# Patient Record
Sex: Male | Born: 1945 | Race: White | Hispanic: No | Smoking: Current every day smoker
Health system: Southern US, Community
[De-identification: ages and names within clinical notes are randomized; demographics above are authoritative.]

## PROBLEM LIST (undated history)

## (undated) DIAGNOSIS — I10 Essential (primary) hypertension: Secondary | ICD-10-CM

## (undated) DIAGNOSIS — I2510 Atherosclerotic heart disease of native coronary artery without angina pectoris: Secondary | ICD-10-CM

## (undated) DIAGNOSIS — K219 Gastro-esophageal reflux disease without esophagitis: Secondary | ICD-10-CM

## (undated) DIAGNOSIS — G4730 Sleep apnea, unspecified: Secondary | ICD-10-CM

## (undated) DIAGNOSIS — I739 Peripheral vascular disease, unspecified: Secondary | ICD-10-CM

## (undated) DIAGNOSIS — M1990 Unspecified osteoarthritis, unspecified site: Secondary | ICD-10-CM

## (undated) DIAGNOSIS — I059 Rheumatic mitral valve disease, unspecified: Secondary | ICD-10-CM

## (undated) DIAGNOSIS — E785 Hyperlipidemia, unspecified: Secondary | ICD-10-CM

## (undated) HISTORY — PX: FRACTURE SURGERY: SHX138

## (undated) HISTORY — PX: EYE SURGERY: SHX253

## (undated) HISTORY — PX: OTHER SURGICAL HISTORY: SHX169

## (undated) HISTORY — PX: CORONARY ANGIOPLASTY: SHX604

## (undated) HISTORY — PX: CAROTID ENDARTERECTOMY: SUR193

## (undated) HISTORY — PX: JOINT REPLACEMENT: SHX530

## (undated) MED FILL — Ferumoxytol Inj 510 MG/17ML (30 MG/ML) (Elemental Fe): INTRAVENOUS | Qty: 17 | Status: AC

---

## 2005-04-09 ENCOUNTER — Other Ambulatory Visit: Payer: Self-pay

## 2005-04-16 ENCOUNTER — Ambulatory Visit: Payer: Self-pay | Admitting: Podiatry

## 2005-11-24 ENCOUNTER — Emergency Department: Payer: Self-pay | Admitting: Emergency Medicine

## 2005-11-25 ENCOUNTER — Other Ambulatory Visit: Payer: Self-pay

## 2005-12-16 ENCOUNTER — Ambulatory Visit: Payer: Self-pay | Admitting: Internal Medicine

## 2006-03-24 ENCOUNTER — Ambulatory Visit: Payer: Self-pay | Admitting: Internal Medicine

## 2006-12-21 ENCOUNTER — Ambulatory Visit: Payer: Self-pay | Admitting: Unknown Physician Specialty

## 2006-12-21 ENCOUNTER — Other Ambulatory Visit: Payer: Self-pay

## 2006-12-26 ENCOUNTER — Ambulatory Visit: Payer: Self-pay | Admitting: Unknown Physician Specialty

## 2007-07-07 ENCOUNTER — Other Ambulatory Visit: Payer: Self-pay

## 2007-07-07 ENCOUNTER — Ambulatory Visit: Payer: Self-pay | Admitting: Internal Medicine

## 2007-12-21 ENCOUNTER — Ambulatory Visit: Payer: Self-pay | Admitting: Vascular Surgery

## 2008-01-12 ENCOUNTER — Ambulatory Visit: Payer: Self-pay | Admitting: Vascular Surgery

## 2008-01-22 ENCOUNTER — Ambulatory Visit: Payer: Self-pay | Admitting: Vascular Surgery

## 2008-01-22 ENCOUNTER — Other Ambulatory Visit: Payer: Self-pay

## 2008-01-26 ENCOUNTER — Inpatient Hospital Stay: Payer: Self-pay | Admitting: Vascular Surgery

## 2008-10-04 ENCOUNTER — Ambulatory Visit: Payer: Self-pay | Admitting: Internal Medicine

## 2008-11-22 ENCOUNTER — Ambulatory Visit: Payer: Self-pay | Admitting: Unknown Physician Specialty

## 2009-08-01 ENCOUNTER — Emergency Department: Payer: Self-pay | Admitting: Emergency Medicine

## 2009-10-05 IMAGING — CT CT ANGIOGRAPHY NECK
1 of 4 series · 12 of 33 positions shown · IV contrast (APPLIED)
Comparison: none

REASON FOR EXAM: Carotid Stenosis Call Report [id number]
COMMENTS:

[Series 5: soft tissue · axial · 0.38mm/px · z∈[-195,+63]mm · 12 of 102 slices shown]
[im 8/102  soft-tissue]
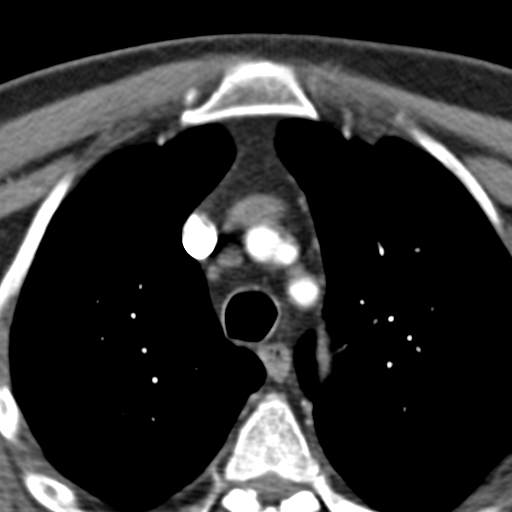
[im 16/102  bone]
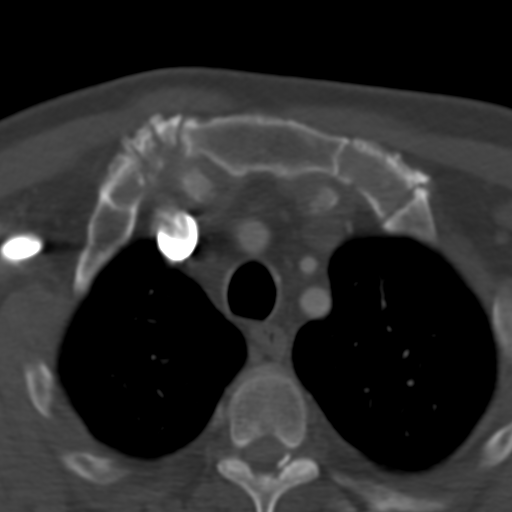
[im 24/102  soft-tissue]
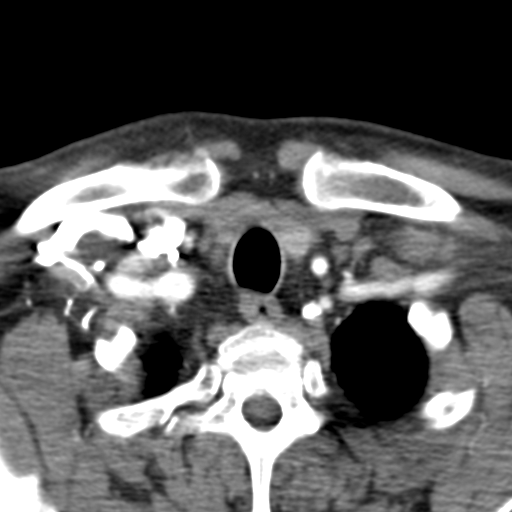
[im 32/102  bone]
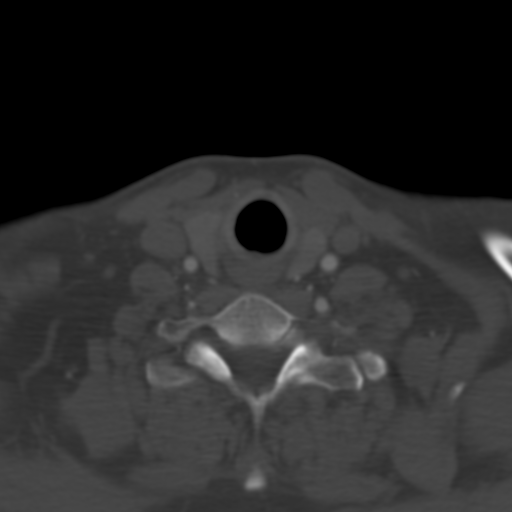
[im 39/102  soft-tissue]
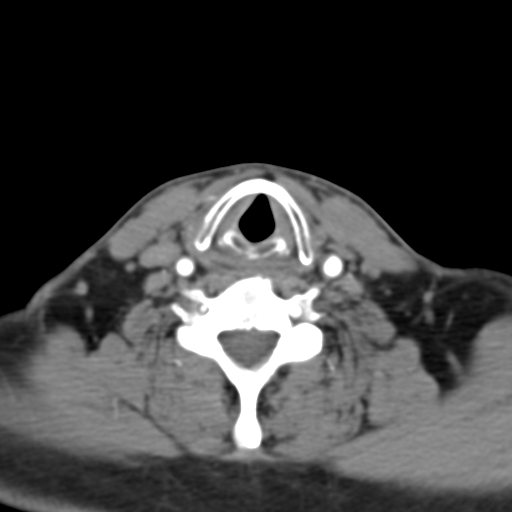
[im 47/102  bone]
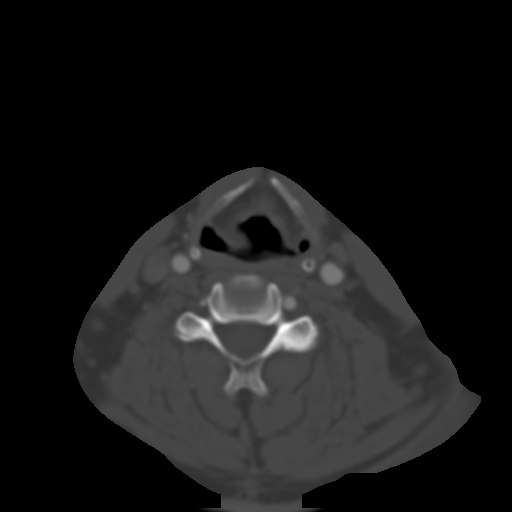
[im 55/102  soft-tissue]
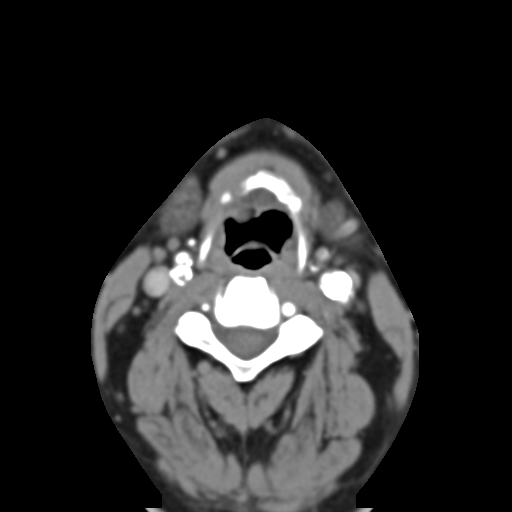
[im 63/102  bone]
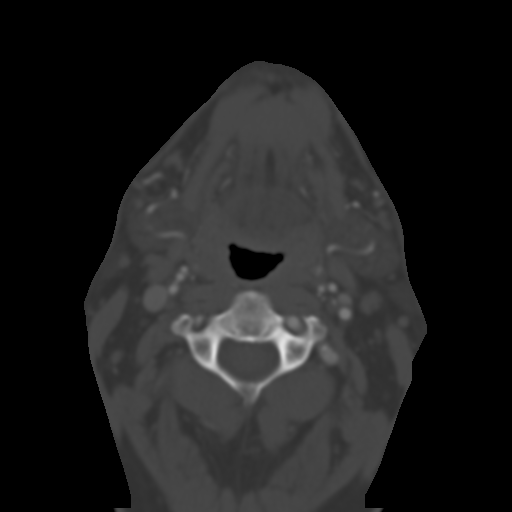
[im 70/102  soft-tissue]
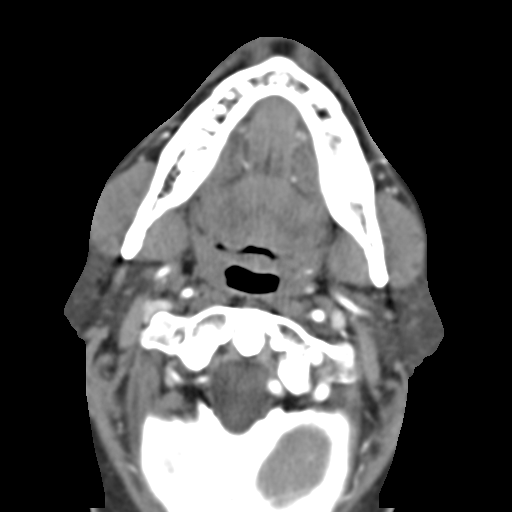
[im 78/102  bone]
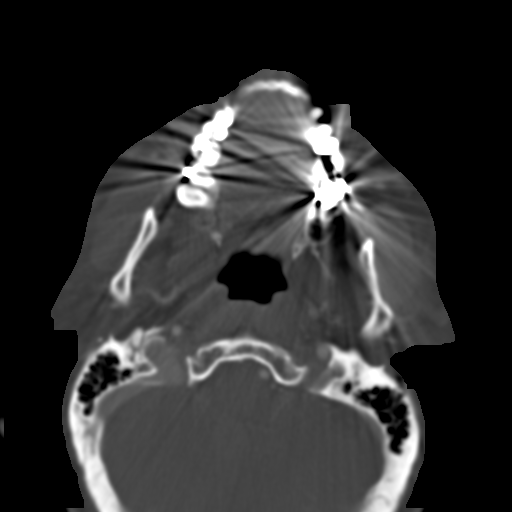
[im 86/102  soft-tissue]
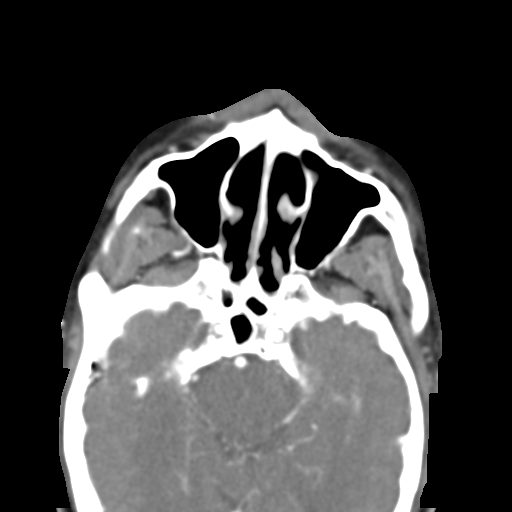
[im 94/102  bone]
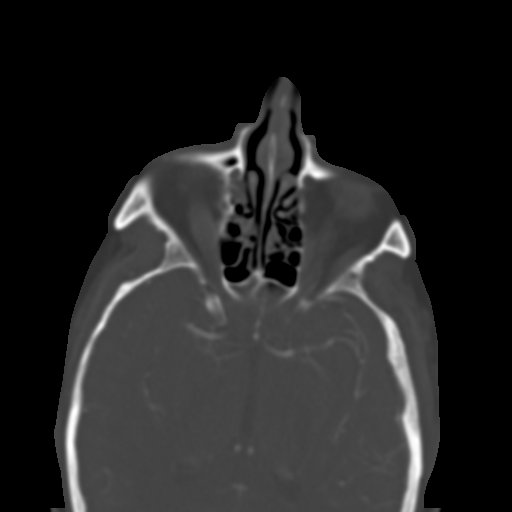

[12 of 33 positions shown; findings below may reference images not displayed]

PROCEDURE:     CT  - CT ANGIOGRAPHY NECK W/WO  - December 21, 2007  [DATE]

RESULT:     CTA of the carotids is performed. There is no previous similar
exam for comparison. The study demonstrates atherosclerotic calcification
prominently demonstrated in both carotid bulbs and proximal internal carotid
arteries. There appears to be a high degree of stenosis on the right side
without definite occlusion. This appears to extend over a length of the
proximal right internal carotid artery of 1.3 to 1.5 cm. There is a high
degree of atherosclerotic plaque in the left carotid bulb and proximal
internal carotid artery without significant stenosis evident. The left
vertebral is dominant. The basilar artery is unremarkable. No intracranial
abnormality is evident. Atherosclerotic calcification is also evident in the
aortic arch. There is poor visualization of the right common carotid artery
because of artifact from the bolus in the right upper extremity. There is a
mucous retention cyst in the floor of the left maxillary sinus.
IMPRESSION: 1.5 cm length high percentage stenosis in the proximal right internal
carotid artery with atherosclerotic calcification present.
2. Atherosclerotic calcification in the left internal carotid artery
proximally without hemodynamically significant stenosis evident.

## 2011-03-17 ENCOUNTER — Ambulatory Visit: Payer: Self-pay | Admitting: Vascular Surgery

## 2011-04-14 ENCOUNTER — Ambulatory Visit: Payer: Self-pay | Admitting: Vascular Surgery

## 2011-04-21 ENCOUNTER — Inpatient Hospital Stay: Payer: Self-pay | Admitting: Vascular Surgery

## 2011-04-23 ENCOUNTER — Emergency Department: Payer: Self-pay | Admitting: Emergency Medicine

## 2014-03-07 DIAGNOSIS — G4730 Sleep apnea, unspecified: Secondary | ICD-10-CM | POA: Insufficient documentation

## 2014-03-07 DIAGNOSIS — I10 Essential (primary) hypertension: Secondary | ICD-10-CM | POA: Insufficient documentation

## 2014-05-08 ENCOUNTER — Ambulatory Visit: Payer: Self-pay | Admitting: Ophthalmology

## 2014-05-08 LAB — POTASSIUM: POTASSIUM: 3.8 mmol/L (ref 3.5–5.1)

## 2014-05-16 ENCOUNTER — Ambulatory Visit: Payer: Self-pay | Admitting: Ophthalmology

## 2014-06-28 ENCOUNTER — Ambulatory Visit: Payer: Self-pay | Admitting: Ophthalmology

## 2014-06-28 LAB — POTASSIUM: Potassium: 4.1 mmol/L (ref 3.5–5.1)

## 2014-07-04 ENCOUNTER — Ambulatory Visit: Payer: Self-pay | Admitting: Ophthalmology

## 2014-12-07 NOTE — Op Note (Signed)
PATIENT NAME:  Ivan Reyes, Ivan Reyes MR#:  [id number] DATE OF BIRTH:  1945/09/30  DATE OF PROCEDURE:  07/04/2014  PREOPERATIVE DIAGNOSIS:  Nuclear sclerotic cataract, left eye.  POSTOPERATIVE DIAGNOSIS:  Nuclear sclerotic cataract, left eye.  PROCEDURE:  Phacoemulsification with posterior chamber intraocular lens left eye, model SN60WF  SURGEON:  Lyla Glassing, MD  INDICATIONS:  This is a 69 year old male with decreased vision in the left eye.  PROCEDURE:  The risks and benefits of cataract surgery were discussed at length with the patient, including bleeding, infection, retinal detachment, re-operation, diplopia, ptosis, loss of vision, and loss of the eye. Informed consent was obtained. On the day of surgery, several sets of preoperative medication were administered to the operative eye including 0.5% tetracaine,1% cyclopentolate, 10% phenylephrine, 0.5% ketorolac, 0.5% gatifloxacin, and 2% lidocaine .  The patient was taken to the operating room and sedated via IV sedation. Topical tetracaine was placed in the eye. The operative eye was prepped using a 10% Betadine solution and then covered in sterile drapes leaving only the operative eye exposed. A Lieberman lid speculum was placed to provide exposure. Using 0.12 forceps and a side-port blade, a paracentesis was created. Then a mixture of BSS, preservative free lidocaine, and epinephrine was injected into the anterior chamber. Next, a 2.4 mm keratome blade was used to create a two-step full-thickness clear corneal incision temporally. The cystitome and Utrata forceps were used to create a continuous capsulorrhexis in the anterior lens capsule. BSS on a hydrodissection cannula was used to perform gentle hydrodissection. Phacoemulsification was then performed to remove the nucleus. Irrigation and aspiration was performed to remove the remaining cortical material. Provisc was injected to fill the capsular bag and anterior chamber. A 22.5-diopter  SN60WF intraocular lens was injected into the capsular bag. The Connor wand was used to rotate it into proper position in the capsular bag. Irrigation and aspiration was performed to remove the remaining Viscoelastic material from the eye. BSS on a 30-gauge cannula was used to hydrate the wound. An intracameral antibiotic was administered. The wounds were checked and found to be watertight. The lid speculum and drapes were carefully removed. Several drops of Vigamox were placed in the operative eye. The eye was covered with protective eyewear. The patient was taken to the recovery area in good condition. There were no complications.  ____________________________ Lyla Glassing, MD nm:sb D: 07/04/2014 11:15:17 ET T: 07/04/2014 16:16:56 ET JOB#: [id number]  cc: Lyla Glassing, MD, <Dictator> Lyla Glassing MD ELECTRONICALLY SIGNED 07/25/2014 12:13

## 2014-12-07 NOTE — Op Note (Signed)
PATIENT NAME:  Ivan Reyes, Ivan Reyes MR#:  [id number] DATE OF BIRTH:  02-23-1946  DATE OF PROCEDURE:  05/16/2014  DIAGNOSIS CODE:  H25.9, senile cataract.   PREOPERATIVE DIAGNOSIS: Senile cataract right eye.   POSTOPERATIVE DIAGNOSIS: Senile cataract right eye.   PROCEDURE: Phacoemulsification with posterior chamber intraocular lens model SN60WF   SURGEON: Lyla Glassing, MD  INDICATIONS: This is a 69 year old male with decreased vision in the right eye.  PROCEDURE: The risks and benefits of cataract surgery were discussed at length with the patient, including bleeding, infection, retinal detachment, re-operation, diplopia, ptosis, loss of vision, and loss of the eye. Informed consent was obtained. On the day of surgery, the patient received several sets of preoperative medication in the right eye including 0.5% tetracaine,1% cyclopentolate, 10% phenylephrine, 0.5% ketorolac, 0.5% gatifloxacin, and 2% lidocaine .   The patient was taken to the operating room and sedated via IV sedation. Topical tetracaine was placed in the eye. The operative eye was prepped using a 10% Betadine solution. The right eye was covered in sterile drapes leaving only the operative eye exposed. A Lieberman lid speculum was placed to provide exposure. Using 0.12 forceps and a sideport blade, a paracentesis was created. Then a mixture of BSS, preservative free lidocaine, and epinephrine was injected into the anterior chamber.  Next, a 2.4 mm keratome blade was used to create a two-step full-thickness clear corneal incision temporally. The cystitome and Utrata forceps were used to create a continuous capsulorrhexis in the anterior lens capsule. BSS on a hydrodissection cannula was used to perform gentle hydrodissection. Phacoemulsification was then performed to remove the nucleus. Irrigation and aspiration was performed to remove the remaining cortical material. Provisc was injected to fill the capsular bag and anterior chamber.  A 22.5-diopter SN60WF intraocular lens was injected into the capsular bag. The Connor wand was used to rotate it into proper position in the capsular bag. Irrigation and aspiration was performed to remove the remaining Viscoelastic material from the eye. BSS on a 30-gauge cannula was used to hydrate the wound. An intracameral antibiotic was administered. The wounds were checked and found to be watertight. The lid speculum and drapes were carefully removed. Several drops of Vigamox were placed in the operative eye. The eye was covered with protective eyewear. The patient was taken to the recovery area in good condition. There were no complications.   ____________________________ Lyla Glassing, MD nm:lt D: 05/16/2014 10:01:26 ET T: 05/16/2014 12:58:45 ET JOB#: [id number]  cc: Lyla Glassing, MD, <Dictator> Lyla Glassing MD ELECTRONICALLY SIGNED 05/16/2014 14:32

## 2015-02-13 DIAGNOSIS — I2510 Atherosclerotic heart disease of native coronary artery without angina pectoris: Secondary | ICD-10-CM | POA: Insufficient documentation

## 2015-02-13 DIAGNOSIS — I6523 Occlusion and stenosis of bilateral carotid arteries: Secondary | ICD-10-CM | POA: Insufficient documentation

## 2015-09-01 DIAGNOSIS — R05 Cough: Secondary | ICD-10-CM | POA: Diagnosis not present

## 2015-09-03 DIAGNOSIS — I10 Essential (primary) hypertension: Secondary | ICD-10-CM | POA: Diagnosis not present

## 2015-09-05 DIAGNOSIS — I2510 Atherosclerotic heart disease of native coronary artery without angina pectoris: Secondary | ICD-10-CM | POA: Diagnosis not present

## 2015-09-05 DIAGNOSIS — I6523 Occlusion and stenosis of bilateral carotid arteries: Secondary | ICD-10-CM | POA: Diagnosis not present

## 2015-09-05 DIAGNOSIS — G4733 Obstructive sleep apnea (adult) (pediatric): Secondary | ICD-10-CM | POA: Diagnosis not present

## 2015-09-10 DIAGNOSIS — I2510 Atherosclerotic heart disease of native coronary artery without angina pectoris: Secondary | ICD-10-CM | POA: Diagnosis not present

## 2015-09-10 DIAGNOSIS — E782 Mixed hyperlipidemia: Secondary | ICD-10-CM | POA: Diagnosis not present

## 2015-09-10 DIAGNOSIS — G4733 Obstructive sleep apnea (adult) (pediatric): Secondary | ICD-10-CM | POA: Diagnosis not present

## 2015-09-10 DIAGNOSIS — I10 Essential (primary) hypertension: Secondary | ICD-10-CM | POA: Diagnosis not present

## 2015-11-02 DIAGNOSIS — J069 Acute upper respiratory infection, unspecified: Secondary | ICD-10-CM | POA: Diagnosis not present

## 2015-11-22 DIAGNOSIS — J209 Acute bronchitis, unspecified: Secondary | ICD-10-CM | POA: Diagnosis not present

## 2016-02-24 DIAGNOSIS — F17200 Nicotine dependence, unspecified, uncomplicated: Secondary | ICD-10-CM | POA: Diagnosis not present

## 2016-02-24 DIAGNOSIS — I2510 Atherosclerotic heart disease of native coronary artery without angina pectoris: Secondary | ICD-10-CM | POA: Diagnosis not present

## 2016-02-24 DIAGNOSIS — E782 Mixed hyperlipidemia: Secondary | ICD-10-CM | POA: Diagnosis not present

## 2016-02-24 DIAGNOSIS — I6523 Occlusion and stenosis of bilateral carotid arteries: Secondary | ICD-10-CM | POA: Diagnosis not present

## 2016-02-24 DIAGNOSIS — R079 Chest pain, unspecified: Secondary | ICD-10-CM | POA: Diagnosis not present

## 2016-02-24 DIAGNOSIS — I10 Essential (primary) hypertension: Secondary | ICD-10-CM | POA: Diagnosis not present

## 2016-02-24 DIAGNOSIS — G4733 Obstructive sleep apnea (adult) (pediatric): Secondary | ICD-10-CM | POA: Diagnosis not present

## 2016-03-04 DIAGNOSIS — E782 Mixed hyperlipidemia: Secondary | ICD-10-CM | POA: Diagnosis not present

## 2016-03-04 DIAGNOSIS — I10 Essential (primary) hypertension: Secondary | ICD-10-CM | POA: Diagnosis not present

## 2016-03-10 DIAGNOSIS — G4733 Obstructive sleep apnea (adult) (pediatric): Secondary | ICD-10-CM | POA: Diagnosis not present

## 2016-03-10 DIAGNOSIS — E782 Mixed hyperlipidemia: Secondary | ICD-10-CM | POA: Diagnosis not present

## 2016-03-10 DIAGNOSIS — Z720 Tobacco use: Secondary | ICD-10-CM | POA: Diagnosis not present

## 2016-03-10 DIAGNOSIS — M25562 Pain in left knee: Secondary | ICD-10-CM | POA: Diagnosis not present

## 2016-03-10 DIAGNOSIS — Z0001 Encounter for general adult medical examination with abnormal findings: Secondary | ICD-10-CM | POA: Diagnosis not present

## 2016-03-10 DIAGNOSIS — I6523 Occlusion and stenosis of bilateral carotid arteries: Secondary | ICD-10-CM | POA: Diagnosis not present

## 2016-03-10 DIAGNOSIS — I10 Essential (primary) hypertension: Secondary | ICD-10-CM | POA: Diagnosis not present

## 2016-03-10 DIAGNOSIS — I2510 Atherosclerotic heart disease of native coronary artery without angina pectoris: Secondary | ICD-10-CM | POA: Diagnosis not present

## 2016-03-10 DIAGNOSIS — G8929 Other chronic pain: Secondary | ICD-10-CM | POA: Diagnosis not present

## 2016-03-10 DIAGNOSIS — Z125 Encounter for screening for malignant neoplasm of prostate: Secondary | ICD-10-CM | POA: Diagnosis not present

## 2016-03-26 DIAGNOSIS — M1712 Unilateral primary osteoarthritis, left knee: Secondary | ICD-10-CM | POA: Diagnosis not present

## 2016-03-26 DIAGNOSIS — M25562 Pain in left knee: Secondary | ICD-10-CM | POA: Diagnosis not present

## 2016-05-06 DIAGNOSIS — M1712 Unilateral primary osteoarthritis, left knee: Secondary | ICD-10-CM | POA: Diagnosis not present

## 2016-06-16 ENCOUNTER — Encounter
Admission: RE | Admit: 2016-06-16 | Discharge: 2016-06-16 | Disposition: A | Discharge status: Home / Self Care | Payer: PPO | Source: Ambulatory Visit | Attending: Orthopedic Surgery | Admitting: Orthopedic Surgery

## 2016-06-16 DIAGNOSIS — I2510 Atherosclerotic heart disease of native coronary artery without angina pectoris: Secondary | ICD-10-CM | POA: Insufficient documentation

## 2016-06-16 DIAGNOSIS — Z01818 Encounter for other preprocedural examination: Secondary | ICD-10-CM | POA: Diagnosis not present

## 2016-06-16 DIAGNOSIS — I10 Essential (primary) hypertension: Secondary | ICD-10-CM | POA: Insufficient documentation

## 2016-06-16 DIAGNOSIS — R001 Bradycardia, unspecified: Secondary | ICD-10-CM | POA: Insufficient documentation

## 2016-06-16 HISTORY — DX: Hyperlipidemia, unspecified: E78.5

## 2016-06-16 HISTORY — DX: Sleep apnea, unspecified: G47.30

## 2016-06-16 HISTORY — DX: Unspecified osteoarthritis, unspecified site: M19.90

## 2016-06-16 HISTORY — DX: Peripheral vascular disease, unspecified: I73.9

## 2016-06-16 HISTORY — DX: Essential (primary) hypertension: I10

## 2016-06-16 HISTORY — DX: Atherosclerotic heart disease of native coronary artery without angina pectoris: I25.10

## 2016-06-16 LAB — CBC
HEMATOCRIT: 44.7 % (ref 40.0–52.0)
Hemoglobin: 15.5 g/dL (ref 13.0–18.0)
MCH: 30.1 pg (ref 26.0–34.0)
MCHC: 34.6 g/dL (ref 32.0–36.0)
MCV: 87 fL (ref 80.0–100.0)
Platelets: 164 10*3/uL (ref 150–440)
RBC: 5.14 MIL/uL (ref 4.40–5.90)
RDW: 16.4 % — AB (ref 11.5–14.5)
WBC: 6.7 10*3/uL (ref 3.8–10.6)

## 2016-06-16 LAB — TYPE AND SCREEN
ABO/RH(D): A POS
ANTIBODY SCREEN: NEGATIVE

## 2016-06-16 LAB — URINALYSIS COMPLETE WITH MICROSCOPIC (ARMC ONLY)
BACTERIA UA: NONE SEEN
Bilirubin Urine: NEGATIVE
GLUCOSE, UA: NEGATIVE mg/dL
Ketones, ur: NEGATIVE mg/dL
Nitrite: NEGATIVE
Protein, ur: NEGATIVE mg/dL
Specific Gravity, Urine: 1.009 (ref 1.005–1.030)
pH: 7 (ref 5.0–8.0)

## 2016-06-16 LAB — COMPREHENSIVE METABOLIC PANEL
ALBUMIN: 4.4 g/dL (ref 3.5–5.0)
ALT: 16 U/L — ABNORMAL LOW (ref 17–63)
ANION GAP: 9 (ref 5–15)
AST: 26 U/L (ref 15–41)
Alkaline Phosphatase: 39 U/L (ref 38–126)
BILIRUBIN TOTAL: 0.6 mg/dL (ref 0.3–1.2)
BUN: 21 mg/dL — ABNORMAL HIGH (ref 6–20)
CO2: 25 mmol/L (ref 22–32)
Calcium: 9.8 mg/dL (ref 8.9–10.3)
Chloride: 104 mmol/L (ref 101–111)
Creatinine, Ser: 0.98 mg/dL (ref 0.61–1.24)
GFR calc Af Amer: >60 mL/min (ref >60)
GLUCOSE: 91 mg/dL (ref 65–99)
POTASSIUM: 4.1 mmol/L (ref 3.5–5.1)
Sodium: 138 mmol/L (ref 135–145)
TOTAL PROTEIN: 7.5 g/dL (ref 6.5–8.1)

## 2016-06-16 LAB — SEDIMENTATION RATE: SED RATE: 6 mm/h (ref 0–20)

## 2016-06-16 LAB — PROTIME-INR
INR: 0.92
PROTHROMBIN TIME: 12.3 s (ref 11.4–15.2)

## 2016-06-16 LAB — SURGICAL PCR SCREEN
MRSA, PCR: NEGATIVE
Staphylococcus aureus: NEGATIVE

## 2016-06-16 LAB — APTT: APTT: 28 s (ref 24–36)

## 2016-06-16 NOTE — Patient Instructions (Addendum)
  Your procedure is scheduled [id number] 15, 2017 (Wednesday)  Report [facility]nd [facility] To find out your arrival time please call [phone] between 1PM - 3PM on June 29, 2016 (Tuesday)  Remember: Instructions that are not followed completely may result in serious medical risk, up to and including death, or upon the discretion of your surgeon and anesthesiologist your surgery may need to be rescheduled.    _x___ 1. Do not eat food or drink liquids after midnight. No gum chewing or hard candies.     __x__ 2. No Alcohol for 24 hours before or after surgery.   __x__3. No Smoking for 24 prior to surgery.   ____  4. Bring all medications with you on the day of surgery if instructed.    __x__ 5. Notify your doctor if there is any change in your medical condition     (cold, fever, infections).     Do not wear jewelry, make-up, hairpins, clips or nail polish.  Do not wear lotions, powders, or perfumes. You may wear deodorant.  Do not shave 48 hours prior to surgery. Men may shave face and neck.  Do not bring valuables to the hospital.    [facility] is not responsible for any belongings or valuables.               Contacts, dentures or bridgework may not be worn into surgery.  Leave your suitcase in the car. After surgery it may be brought to your room.  For patients admitted to the hospital, discharge time is determined by your treatment team.   Patients discharged the day of surgery will not be allowed to drive home.    Please read over the following fact sheets that you were given:   [facility] Preparing for Surgery and or MRSA Information   _x___ Take these medicines the morning of surgery with A SIP OF WATER:    1. Enalapril  2. Simvastatin  3.  4.  5.  6.  ____Fleets enema or Magnesium Citrate as directed.   _x___ Use CHG Soap or sage wipes as directed on instruction sheet   ____ Use inhalers on the day of surgery and bring to hospital  day of surgery  ____ Stop metformin 2 days prior to surgery    ____ Take 1/2 of usual insulin dose the night before surgery and none on the morning of           surgery.   ___ Stop aspirin or coumadin, or plavix (Patient has an appointment with Dr. Nehemiah Massed on June 17, 2016 at 10:45 am to discuss Plavix)  x__ Stop Anti-inflammatories such as Advil, Aleve, Ibuprofen, Motrin, Naproxen,          Naprosyn, Goodies powders or aspirin products. Ok to take Tylenol.   _x___ Stop supplements until after surgery.  (Stop Fish Oil. Vitamin E, Vitamin B-12, and Glucosamine Chondroitin now)  _x___ Bring C-Pap to the hospital.

## 2016-06-17 DIAGNOSIS — Z01818 Encounter for other preprocedural examination: Secondary | ICD-10-CM | POA: Diagnosis not present

## 2016-06-17 DIAGNOSIS — I10 Essential (primary) hypertension: Secondary | ICD-10-CM | POA: Diagnosis not present

## 2016-06-17 DIAGNOSIS — R001 Bradycardia, unspecified: Secondary | ICD-10-CM | POA: Diagnosis not present

## 2016-06-17 DIAGNOSIS — I2510 Atherosclerotic heart disease of native coronary artery without angina pectoris: Secondary | ICD-10-CM | POA: Diagnosis not present

## 2016-06-17 DIAGNOSIS — E782 Mixed hyperlipidemia: Secondary | ICD-10-CM | POA: Diagnosis not present

## 2016-06-17 LAB — URINE CULTURE
Culture: <10000 — AB
Special Requests: NORMAL

## 2016-06-18 NOTE — Pre-Procedure Instructions (Signed)
Urine culture sent to Dr. Marry Guan for review.

## 2016-06-29 MED ORDER — TRANEXAMIC ACID 1000 MG/10ML IV SOLN
1000.0000 mg | INTRAVENOUS | Status: AC
  Administered 2016-06-30: 1000 mg via INTRAVENOUS
  Filled 2016-06-29: qty 10

## 2016-06-29 MED ORDER — CEFAZOLIN SODIUM-DEXTROSE 2-4 GM/100ML-% IV SOLN: 2.0000 g | INTRAVENOUS | Status: DC

## 2016-06-30 ENCOUNTER — Inpatient Hospital Stay: Payer: PPO

## 2016-06-30 ENCOUNTER — Inpatient Hospital Stay
Admission: RE | Admit: 2016-06-30 | Discharge: 2016-07-02 | DRG: 470 | Disposition: A | Discharge status: Home Health | Payer: PPO | Source: Ambulatory Visit | Attending: Orthopedic Surgery | Admitting: Orthopedic Surgery

## 2016-06-30 ENCOUNTER — Inpatient Hospital Stay: Payer: PPO | Admitting: Anesthesiology

## 2016-06-30 ENCOUNTER — Encounter
Admission: RE | Disposition: A | Discharge status: Home Health | Payer: Self-pay | Source: Ambulatory Visit | Attending: Orthopedic Surgery

## 2016-06-30 DIAGNOSIS — Z471 Aftercare following joint replacement surgery: Secondary | ICD-10-CM | POA: Diagnosis not present

## 2016-06-30 DIAGNOSIS — I739 Peripheral vascular disease, unspecified: Secondary | ICD-10-CM | POA: Diagnosis not present

## 2016-06-30 DIAGNOSIS — Z7902 Long term (current) use of antithrombotics/antiplatelets: Secondary | ICD-10-CM | POA: Diagnosis not present

## 2016-06-30 DIAGNOSIS — Z951 Presence of aortocoronary bypass graft: Secondary | ICD-10-CM | POA: Diagnosis not present

## 2016-06-30 DIAGNOSIS — F17210 Nicotine dependence, cigarettes, uncomplicated: Secondary | ICD-10-CM | POA: Diagnosis present

## 2016-06-30 DIAGNOSIS — I10 Essential (primary) hypertension: Secondary | ICD-10-CM | POA: Diagnosis present

## 2016-06-30 DIAGNOSIS — E785 Hyperlipidemia, unspecified: Secondary | ICD-10-CM | POA: Diagnosis not present

## 2016-06-30 DIAGNOSIS — I2510 Atherosclerotic heart disease of native coronary artery without angina pectoris: Secondary | ICD-10-CM | POA: Diagnosis not present

## 2016-06-30 DIAGNOSIS — Z79899 Other long term (current) drug therapy: Secondary | ICD-10-CM

## 2016-06-30 DIAGNOSIS — G4730 Sleep apnea, unspecified: Secondary | ICD-10-CM | POA: Diagnosis present

## 2016-06-30 DIAGNOSIS — M1712 Unilateral primary osteoarthritis, left knee: Secondary | ICD-10-CM | POA: Diagnosis not present

## 2016-06-30 DIAGNOSIS — Z96659 Presence of unspecified artificial knee joint: Secondary | ICD-10-CM

## 2016-06-30 DIAGNOSIS — Z96652 Presence of left artificial knee joint: Secondary | ICD-10-CM | POA: Diagnosis not present

## 2016-06-30 DIAGNOSIS — M25662 Stiffness of left knee, not elsewhere classified: Secondary | ICD-10-CM

## 2016-06-30 DIAGNOSIS — M6281 Muscle weakness (generalized): Secondary | ICD-10-CM

## 2016-06-30 HISTORY — PX: KNEE ARTHROPLASTY: SHX992

## 2016-06-30 LAB — ABO/RH: ABO/RH(D): A POS

## 2016-06-30 SURGERY — ARTHROPLASTY, KNEE, TOTAL, USING IMAGELESS COMPUTER-ASSISTED NAVIGATION
Anesthesia: Spinal | Site: Knee | Laterality: Left | Wound class: Clean

## 2016-06-30 MED ORDER — CEFAZOLIN SODIUM-DEXTROSE 2-3 GM-% IV SOLR
INTRAVENOUS | Status: DC | PRN
  Administered 2016-06-30: 2 g via INTRAVENOUS

## 2016-06-30 MED ORDER — PHENOL 1.4 % MT LIQD
1.0000 | OROMUCOSAL | Status: DC | PRN
  Filled 2016-06-30: qty 177

## 2016-06-30 MED ORDER — SODIUM CHLORIDE 0.9 % IJ SOLN
INTRAMUSCULAR | Status: AC
  Filled 2016-06-30: qty 50

## 2016-06-30 MED ORDER — FAMOTIDINE 20 MG PO TABS
ORAL_TABLET | ORAL | Status: AC
  Administered 2016-06-30: 20 mg via ORAL
  Filled 2016-06-30: qty 1

## 2016-06-30 MED ORDER — OXYCODONE HCL 5 MG PO TABS
5.0000 mg | ORAL_TABLET | ORAL | Status: DC | PRN
  Administered 2016-06-30: 10 mg via ORAL
  Administered 2016-06-30 (×2): 5 mg via ORAL
  Administered 2016-07-01 – 2016-07-02 (×3): 10 mg via ORAL
  Filled 2016-06-30: qty 1
  Filled 2016-06-30: qty 2
  Filled 2016-06-30: qty 1
  Filled 2016-06-30 (×3): qty 2

## 2016-06-30 MED ORDER — BUPIVACAINE LIPOSOME 1.3 % IJ SUSP
INTRAMUSCULAR | Status: AC
  Filled 2016-06-30: qty 20

## 2016-06-30 MED ORDER — PROPOFOL 500 MG/50ML IV EMUL
INTRAVENOUS | Status: DC | PRN
  Administered 2016-06-30: 70 ug/kg/min via INTRAVENOUS

## 2016-06-30 MED ORDER — ACETAMINOPHEN 10 MG/ML IV SOLN
1000.0000 mg | Freq: Four times a day (QID) | INTRAVENOUS | Status: AC
  Administered 2016-06-30 – 2016-07-01 (×4): 1000 mg via INTRAVENOUS
  Filled 2016-06-30 (×4): qty 100

## 2016-06-30 MED ORDER — FENOFIBRATE 160 MG PO TABS
160.0000 mg | ORAL_TABLET | Freq: Every day | ORAL | Status: DC
  Administered 2016-07-01 – 2016-07-02 (×2): 160 mg via ORAL
  Filled 2016-06-30 (×2): qty 1

## 2016-06-30 MED ORDER — KETAMINE HCL 50 MG/ML IJ SOLN
INTRAMUSCULAR | Status: DC | PRN
  Administered 2016-06-30 (×2): 1.65 mg via INTRAMUSCULAR

## 2016-06-30 MED ORDER — MORPHINE SULFATE (PF) 2 MG/ML IV SOLN: 2.0000 mg | INTRAVENOUS | Status: DC | PRN

## 2016-06-30 MED ORDER — FENTANYL CITRATE (PF) 100 MCG/2ML IJ SOLN: 25.0000 ug | INTRAMUSCULAR | Status: DC | PRN

## 2016-06-30 MED ORDER — ALUM & MAG HYDROXIDE-SIMETH 200-200-20 MG/5ML PO SUSP: 30.0000 mL | ORAL | Status: DC | PRN

## 2016-06-30 MED ORDER — METOCLOPRAMIDE HCL 10 MG PO TABS
10.0000 mg | ORAL_TABLET | Freq: Three times a day (TID) | ORAL | Status: AC
  Administered 2016-06-30 – 2016-07-02 (×7): 10 mg via ORAL
  Filled 2016-06-30 (×7): qty 1

## 2016-06-30 MED ORDER — ACETAMINOPHEN 650 MG RE SUPP: 650.0000 mg | Freq: Four times a day (QID) | RECTAL | Status: DC | PRN

## 2016-06-30 MED ORDER — SIMVASTATIN 20 MG PO TABS
40.0000 mg | ORAL_TABLET | Freq: Every day | ORAL | Status: DC
  Administered 2016-07-01: 40 mg via ORAL
  Filled 2016-06-30: qty 2

## 2016-06-30 MED ORDER — OMEGA-3-ACID ETHYL ESTERS 1 G PO CAPS
1.0000 g | ORAL_CAPSULE | Freq: Two times a day (BID) | ORAL | Status: DC
  Administered 2016-06-30 – 2016-07-02 (×3): 1 g via ORAL
  Filled 2016-06-30 (×4): qty 1

## 2016-06-30 MED ORDER — MENTHOL 3 MG MT LOZG
1.0000 | LOZENGE | OROMUCOSAL | Status: DC | PRN
  Filled 2016-06-30: qty 9

## 2016-06-30 MED ORDER — FERROUS SULFATE 325 (65 FE) MG PO TABS
325.0000 mg | ORAL_TABLET | Freq: Two times a day (BID) | ORAL | Status: DC
  Administered 2016-06-30 – 2016-07-02 (×4): 325 mg via ORAL
  Filled 2016-06-30 (×4): qty 1

## 2016-06-30 MED ORDER — CHLORHEXIDINE GLUCONATE 4 % EX LIQD: 60.0000 mL | Freq: Once | CUTANEOUS | Status: DC

## 2016-06-30 MED ORDER — TETRACAINE HCL 1 % IJ SOLN
INTRAMUSCULAR | Status: AC
  Filled 2016-06-30: qty 2

## 2016-06-30 MED ORDER — NEOMYCIN-POLYMYXIN B GU 40-200000 IR SOLN
Status: AC
  Filled 2016-06-30: qty 20

## 2016-06-30 MED ORDER — PROPOFOL 10 MG/ML IV BOLUS
INTRAVENOUS | Status: DC | PRN
  Administered 2016-06-30 (×2): 16.5 mg via INTRAVENOUS

## 2016-06-30 MED ORDER — BISACODYL 10 MG RE SUPP
10.0000 mg | Freq: Every day | RECTAL | Status: DC | PRN
  Administered 2016-07-02: 10 mg via RECTAL
  Filled 2016-06-30: qty 1

## 2016-06-30 MED ORDER — FAMOTIDINE 20 MG PO TABS
20.0000 mg | ORAL_TABLET | Freq: Once | ORAL | Status: AC
  Administered 2016-06-30: 20 mg via ORAL

## 2016-06-30 MED ORDER — SODIUM CHLORIDE 0.9 % IV SOLN
INTRAVENOUS | Status: DC | PRN
  Administered 2016-06-30: 60 mL

## 2016-06-30 MED ORDER — NEOMYCIN-POLYMYXIN B GU 40-200000 IR SOLN
Status: DC | PRN
  Administered 2016-06-30: 14 mL

## 2016-06-30 MED ORDER — VITAMIN D 1000 UNITS PO TABS
2000.0000 [IU] | ORAL_TABLET | Freq: Every day | ORAL | Status: DC
  Administered 2016-07-01: 2000 [IU] via ORAL
  Filled 2016-06-30 (×2): qty 2

## 2016-06-30 MED ORDER — CEFAZOLIN SODIUM-DEXTROSE 2-4 GM/100ML-% IV SOLN
2.0000 g | Freq: Four times a day (QID) | INTRAVENOUS | Status: AC
  Administered 2016-06-30 – 2016-07-01 (×4): 2 g via INTRAVENOUS
  Filled 2016-06-30 (×4): qty 100

## 2016-06-30 MED ORDER — GLYCOPYRROLATE 0.2 MG/ML IJ SOLN
INTRAMUSCULAR | Status: DC | PRN
  Administered 2016-06-30: 0.3 mg via INTRAVENOUS
  Administered 2016-06-30: 0.1 mg via INTRAVENOUS

## 2016-06-30 MED ORDER — BUPIVACAINE HCL (PF) 0.25 % IJ SOLN
INTRAMUSCULAR | Status: AC
  Filled 2016-06-30: qty 60

## 2016-06-30 MED ORDER — VITAMIN B-12 1000 MCG PO TABS
500.0000 ug | ORAL_TABLET | Freq: Every day | ORAL | Status: DC
  Administered 2016-07-01: 500 ug via ORAL
  Filled 2016-06-30: qty 1

## 2016-06-30 MED ORDER — ENOXAPARIN SODIUM 30 MG/0.3ML ~~LOC~~ SOLN
30.0000 mg | Freq: Two times a day (BID) | SUBCUTANEOUS | Status: DC
  Administered 2016-07-01 – 2016-07-02 (×3): 30 mg via SUBCUTANEOUS
  Filled 2016-06-30 (×3): qty 0.3

## 2016-06-30 MED ORDER — CEFAZOLIN SODIUM-DEXTROSE 2-4 GM/100ML-% IV SOLN
INTRAVENOUS | Status: AC
  Filled 2016-06-30: qty 100

## 2016-06-30 MED ORDER — SODIUM CHLORIDE 0.9 % IV SOLN
INTRAVENOUS | Status: DC
  Administered 2016-06-30: via INTRAVENOUS
  Administered 2016-06-30: 1000 mL via INTRAVENOUS

## 2016-06-30 MED ORDER — SODIUM CHLORIDE 0.9 % IV SOLN
INTRAVENOUS | Status: DC | PRN
  Administered 2016-06-30: 7 ug/kg/min via INTRAVENOUS

## 2016-06-30 MED ORDER — HYDROCHLOROTHIAZIDE 25 MG PO TABS
12.5000 mg | ORAL_TABLET | Freq: Every day | ORAL | Status: DC
  Filled 2016-06-30 (×2): qty 1

## 2016-06-30 MED ORDER — TRANEXAMIC ACID 1000 MG/10ML IV SOLN
1000.0000 mg | Freq: Once | INTRAVENOUS | Status: AC
  Administered 2016-06-30: 1000 mg via INTRAVENOUS
  Filled 2016-06-30: qty 10

## 2016-06-30 MED ORDER — ACETAMINOPHEN 325 MG PO TABS: 650.0000 mg | ORAL_TABLET | Freq: Four times a day (QID) | ORAL | Status: DC | PRN

## 2016-06-30 MED ORDER — BUPIVACAINE HCL (PF) 0.25 % IJ SOLN
INTRAMUSCULAR | Status: DC | PRN
  Administered 2016-06-30: 60 mL

## 2016-06-30 MED ORDER — PANTOPRAZOLE SODIUM 40 MG PO TBEC
40.0000 mg | DELAYED_RELEASE_TABLET | Freq: Two times a day (BID) | ORAL | Status: DC
  Administered 2016-06-30 – 2016-07-02 (×4): 40 mg via ORAL
  Filled 2016-06-30 (×4): qty 1

## 2016-06-30 MED ORDER — DIPHENHYDRAMINE HCL 12.5 MG/5ML PO ELIX: 12.5000 mg | ORAL_SOLUTION | ORAL | Status: DC | PRN

## 2016-06-30 MED ORDER — SENNOSIDES-DOCUSATE SODIUM 8.6-50 MG PO TABS
1.0000 | ORAL_TABLET | Freq: Two times a day (BID) | ORAL | Status: DC
  Administered 2016-06-30 – 2016-07-01 (×3): 1 via ORAL
  Filled 2016-06-30 (×3): qty 1

## 2016-06-30 MED ORDER — MIDAZOLAM HCL 5 MG/5ML IJ SOLN
INTRAMUSCULAR | Status: DC | PRN
  Administered 2016-06-30: 2 mg via INTRAVENOUS

## 2016-06-30 MED ORDER — TRAMADOL HCL 50 MG PO TABS
50.0000 mg | ORAL_TABLET | ORAL | Status: DC | PRN
  Administered 2016-06-30 – 2016-07-02 (×4): 100 mg via ORAL
  Filled 2016-06-30 (×4): qty 2

## 2016-06-30 MED ORDER — ACETAMINOPHEN 10 MG/ML IV SOLN
INTRAVENOUS | Status: AC
Start: 2016-06-30 — End: 2016-06-30
  Filled 2016-06-30: qty 100

## 2016-06-30 MED ORDER — EPHEDRINE SULFATE 50 MG/ML IJ SOLN
INTRAMUSCULAR | Status: DC | PRN
  Administered 2016-06-30: 15 mg via INTRAVENOUS

## 2016-06-30 MED ORDER — ENALAPRIL MALEATE 10 MG PO TABS
20.0000 mg | ORAL_TABLET | Freq: Two times a day (BID) | ORAL | Status: DC
  Administered 2016-06-30 – 2016-07-02 (×3): 20 mg via ORAL
  Filled 2016-06-30 (×4): qty 2

## 2016-06-30 MED ORDER — FENTANYL CITRATE (PF) 100 MCG/2ML IJ SOLN
INTRAMUSCULAR | Status: DC | PRN
  Administered 2016-06-30: 50 ug via INTRAVENOUS

## 2016-06-30 MED ORDER — SODIUM CHLORIDE 0.9 % IV SOLN
INTRAVENOUS | Status: DC | PRN
  Administered 2016-06-30: 25 ug/min via INTRAVENOUS

## 2016-06-30 MED ORDER — BUPIVACAINE HCL (PF) 0.5 % IJ SOLN
INTRAMUSCULAR | Status: DC | PRN
  Administered 2016-06-30: 2 mL

## 2016-06-30 MED ORDER — VITAMIN E 180 MG (400 UNIT) PO CAPS
400.0000 [IU] | ORAL_CAPSULE | Freq: Every day | ORAL | Status: DC
  Administered 2016-07-02: 400 [IU] via ORAL
  Filled 2016-06-30: qty 1

## 2016-06-30 MED ORDER — MEPERIDINE HCL 25 MG/ML IJ SOLN: 6.2500 mg | INTRAMUSCULAR | Status: DC | PRN

## 2016-06-30 MED ORDER — FLEET ENEMA 7-19 GM/118ML RE ENEM: 1.0000 | ENEMA | Freq: Once | RECTAL | Status: DC | PRN

## 2016-06-30 MED ORDER — OXYCODONE HCL 5 MG/5ML PO SOLN: 5.0000 mg | Freq: Once | ORAL | Status: DC | PRN

## 2016-06-30 MED ORDER — ONDANSETRON HCL 4 MG PO TABS: 4.0000 mg | ORAL_TABLET | Freq: Four times a day (QID) | ORAL | Status: DC | PRN

## 2016-06-30 MED ORDER — CLOPIDOGREL BISULFATE 75 MG PO TABS
75.0000 mg | ORAL_TABLET | Freq: Every day | ORAL | Status: DC
  Administered 2016-07-01 – 2016-07-02 (×2): 75 mg via ORAL
  Filled 2016-06-30 (×2): qty 1

## 2016-06-30 MED ORDER — TETRACAINE HCL 1 % IJ SOLN
INTRAMUSCULAR | Status: DC | PRN
  Administered 2016-06-30: 10 mg via INTRASPINAL

## 2016-06-30 MED ORDER — ONDANSETRON HCL 4 MG/2ML IJ SOLN: 4.0000 mg | Freq: Four times a day (QID) | INTRAMUSCULAR | Status: DC | PRN

## 2016-06-30 MED ORDER — LACTATED RINGERS IV SOLN
INTRAVENOUS | Status: DC
  Administered 2016-06-30 (×2): via INTRAVENOUS

## 2016-06-30 MED ORDER — MAGNESIUM HYDROXIDE 400 MG/5ML PO SUSP
30.0000 mL | Freq: Every day | ORAL | Status: DC | PRN
  Administered 2016-07-01 (×2): 30 mL via ORAL
  Filled 2016-06-30 (×3): qty 30

## 2016-06-30 MED ORDER — OXYCODONE HCL 5 MG PO TABS: 5.0000 mg | ORAL_TABLET | Freq: Once | ORAL | Status: DC | PRN

## 2016-06-30 MED ORDER — PROMETHAZINE HCL 25 MG/ML IJ SOLN: 6.2500 mg | INTRAMUSCULAR | Status: DC | PRN

## 2016-06-30 MED ORDER — ACETAMINOPHEN 10 MG/ML IV SOLN
INTRAVENOUS | Status: DC | PRN
  Administered 2016-06-30: 1000 mg via INTRAVENOUS

## 2016-06-30 MED ORDER — SODIUM CHLORIDE FLUSH 0.9 % IV SOLN
INTRAVENOUS | Status: AC
  Filled 2016-06-30: qty 10

## 2016-06-30 SURGICAL SUPPLY — 58 items
AUTOTRANSFUS HAS 1/8 (MISCELLANEOUS) ×3
BATTERY INSTRU NAVIGATION (MISCELLANEOUS) ×12 IMPLANT
BLADE SAW 1 (BLADE) ×3 IMPLANT
BLADE SAW 1/2 (BLADE) ×3 IMPLANT
CANISTER SUCT 1200ML W/VALVE (MISCELLANEOUS) ×3 IMPLANT
CANISTER SUCT 3000ML (MISCELLANEOUS) ×6 IMPLANT
CAPT KNEE TOTAL 3 ATTUNE ×3 IMPLANT
CATH TRAY METER 16FR LF (MISCELLANEOUS) ×3 IMPLANT
CEMENT HV SMART SET (Cement) ×6 IMPLANT
COOLER POLAR GLACIER W/PUMP (MISCELLANEOUS) ×3 IMPLANT
CUFF TOURN 24 STER (MISCELLANEOUS) ×3 IMPLANT
CUFF TOURN 30 STER DUAL PORT (MISCELLANEOUS) IMPLANT
DRAPE SHEET LG 3/4 BI-LAMINATE (DRAPES) ×3 IMPLANT
DRSG DERMACEA 8X12 NADH (GAUZE/BANDAGES/DRESSINGS) ×3 IMPLANT
DRSG OPSITE POSTOP 4X14 (GAUZE/BANDAGES/DRESSINGS) ×3 IMPLANT
DRSG TEGADERM 4X4.75 (GAUZE/BANDAGES/DRESSINGS) ×3 IMPLANT
DURAPREP 26ML APPLICATOR (WOUND CARE) ×6 IMPLANT
ELECT CAUTERY BLADE 6.4 (BLADE) ×3 IMPLANT
ELECT REM PT RETURN 9FT ADLT (ELECTROSURGICAL) ×3
ELECTRODE REM PT RTRN 9FT ADLT (ELECTROSURGICAL) ×1 IMPLANT
EX-PIN ORTHOLOCK NAV 4X150 (PIN) ×6 IMPLANT
GLOVE BIOGEL M STRL SZ7.5 (GLOVE) ×6 IMPLANT
GLOVE INDICATOR 8.0 STRL GRN (GLOVE) ×3 IMPLANT
GLOVE SURG 9.0 ORTHO LTXF (GLOVE) ×3 IMPLANT
GLOVE SURG ORTHO 9.0 STRL STRW (GLOVE) ×3 IMPLANT
GOWN STRL REUS W/ TWL LRG LVL3 (GOWN DISPOSABLE) ×2 IMPLANT
GOWN STRL REUS W/TWL 2XL LVL3 (GOWN DISPOSABLE) ×3 IMPLANT
GOWN STRL REUS W/TWL LRG LVL3 (GOWN DISPOSABLE) ×4
HANDPIECE INTERPULSE COAX TIP (DISPOSABLE) ×2
HOLDER FOLEY CATH W/STRAP (MISCELLANEOUS) ×3 IMPLANT
HOOD PEEL AWAY FLYTE STAYCOOL (MISCELLANEOUS) ×6 IMPLANT
KIT RM TURNOVER STRD PROC AR (KITS) ×3 IMPLANT
KNIFE SCULPS 14X20 (INSTRUMENTS) ×3 IMPLANT
LABEL OR SOLS (LABEL) ×3 IMPLANT
NDL SAFETY 18GX1.5 (NEEDLE) ×3 IMPLANT
NEEDLE SPNL 20GX3.5 QUINCKE YW (NEEDLE) ×3 IMPLANT
NS IRRIG 500ML POUR BTL (IV SOLUTION) ×3 IMPLANT
PACK TOTAL KNEE (MISCELLANEOUS) ×3 IMPLANT
PAD WRAPON POLAR KNEE (MISCELLANEOUS) ×1 IMPLANT
PIN FIXATION 1/8DIA X 3INL (PIN) ×3 IMPLANT
SET HNDPC FAN SPRY TIP SCT (DISPOSABLE) ×1 IMPLANT
SOL .9 NS 3000ML IRR  AL (IV SOLUTION) ×2
SOL .9 NS 3000ML IRR UROMATIC (IV SOLUTION) ×1 IMPLANT
SOL PREP PVP 2OZ (MISCELLANEOUS) ×3
SOLUTION PREP PVP 2OZ (MISCELLANEOUS) ×1 IMPLANT
SPONGE DRAIN TRACH 4X4 STRL 2S (GAUZE/BANDAGES/DRESSINGS) ×3 IMPLANT
STAPLER SKIN PROX 35W (STAPLE) ×3 IMPLANT
SUCTION FRAZIER HANDLE 10FR (MISCELLANEOUS) ×2
SUCTION TUBE FRAZIER 10FR DISP (MISCELLANEOUS) ×1 IMPLANT
SUT VIC AB 0 CT1 36 (SUTURE) ×3 IMPLANT
SUT VIC AB 1 CT1 36 (SUTURE) ×6 IMPLANT
SUT VIC AB 2-0 CT2 27 (SUTURE) ×3 IMPLANT
SYR 20CC LL (SYRINGE) ×3 IMPLANT
SYR 30ML LL (SYRINGE) ×6 IMPLANT
SYSTEM AUTOTRANSFUS DUAL TROCR (MISCELLANEOUS) ×1 IMPLANT
TOWEL OR 17X26 4PK STRL BLUE (TOWEL DISPOSABLE) ×3 IMPLANT
TOWER CARTRIDGE SMART MIX (DISPOSABLE) ×3 IMPLANT
WRAPON POLAR PAD KNEE (MISCELLANEOUS) ×3

## 2016-06-30 NOTE — Brief Op Note (Signed)
06/30/2016  11:03 AM  PATIENT:  Ivan Reyes  70 y.o. male  PRE-OPERATIVE DIAGNOSIS:  PRIMARY OSTEOARTHRITIS LEFT KNEE  POST-OPERATIVE DIAGNOSIS:  PRIMARY OSTEOARTHRITIS LEFT KNEE  PROCEDURE:  Procedure(s): COMPUTER ASSISTED TOTAL KNEE ARTHROPLASTY (Left)  SURGEON:  Surgeon(s) and Role:    * Dereck Leep, MD - Primary  ASSISTANTS: Vance Peper, PA   ANESTHESIA:   spinal  EBL:  Total I/O In: 1450 [I.V.:1450] Out: 650 [Urine:600; Blood:50]  BLOOD ADMINISTERED:none  DRAINS: 2 medium drains to a reinfusion system   LOCAL MEDICATIONS USED:  MARCAINE    and OTHER Exparel  SPECIMEN:  No Specimen  DISPOSITION OF SPECIMEN:  N/A  COUNTS:  YES  TOURNIQUET:   87 minutes  DICTATION: .Dragon Dictation  PLAN OF CARE: Admit to inpatient   PATIENT DISPOSITION:  PACU - hemodynamically stable.   Delay start of Pharmacological VTE agent (>24hrs) due to surgical blood loss or risk of bleeding: yes

## 2016-06-30 NOTE — Op Note (Signed)
OPERATIVE NOTE  DATE OF SURGERY:  06/30/2016  PATIENT NAME:  FYODOR KRUZAN   DOB: 07-09-1946  MRN: [id number]  PRE-OPERATIVE DIAGNOSIS: Degenerative arthrosis of the left knee, primary  POST-OPERATIVE DIAGNOSIS:  Same  PROCEDURE:  Left total knee arthroplasty using computer-assisted navigation  SURGEON:  Marciano Sequin. M.D.  ASSISTANT:  Vance Peper, PA (present and scrubbed throughout the case, critical for assistance with exposure, retraction, instrumentation, and closure)  ANESTHESIA: spinal  ESTIMATED BLOOD LOSS: 50 mL  FLUIDS REPLACED: 1450 mL of crystalloid  TOURNIQUET TIME: 87 minutes  DRAINS: 2 medium drains to a reinfusion system  SOFT TISSUE RELEASES: Anterior cruciate ligament, posterior cruciate ligament, deep  medial collateral ligament, patellofemoral ligament  IMPLANTS UTILIZED: DePuy Attune size 6 posterior stabilized femoral component (cemented), size 6 rotating platform tibial component (cemented), 38 mm medialized dome patella (cemented), and a 5 mm stabilized rotating platform polyethylene insert.  INDICATIONS FOR SURGERY: MILTON LANIER is a 70 y.o. year old male with a long history of progressive knee pain. X-rays demonstrated severe degenerative changes in tricompartmental fashion. The patient had not seen any significant improvement despite conservative nonsurgical intervention. After discussion of the risks and benefits of surgical intervention, the patient expressed understanding of the risks benefits and agree with plans for total knee arthroplasty.   The risks, benefits, and alternatives were discussed at length including but not limited to the risks of infection, bleeding, nerve injury, stiffness, blood clots, the need for revision surgery, cardiopulmonary complications, among others, and they were willing to proceed.  PROCEDURE IN DETAIL: The patient was brought into the operating room and, after adequate spinal anesthesia was achieved, a  tourniquet was placed on the patient's upper thigh. The patient's knee and leg were cleaned and prepped with alcohol and DuraPrep and draped in the usual sterile fashion. A "timeout" was performed as per usual protocol. The lower extremity was exsanguinated using an Esmarch, and the tourniquet was inflated to 300 mmHg. An anterior longitudinal incision was made followed by a standard mid vastus approach. The deep fibers of the medial collateral ligament were elevated in a subperiosteal fashion off of the medial flare of the tibia so as to maintain a continuous soft tissue sleeve. The patella was subluxed laterally and the patellofemoral ligament was incised. Inspection of the knee demonstrated severe degenerative changes with full-thickness loss of articular cartilage. Osteophytes were debrided using a rongeur. Anterior and posterior cruciate ligaments were excised. Two 4.0 mm Schanz pins were inserted in the femur and into the tibia for attachment of the array of trackers used for computer-assisted navigation. Hip center was identified using a circumduction technique. Distal landmarks were mapped using the computer. The distal femur and proximal tibia were mapped using the computer. The distal femoral cutting guide was positioned using computer-assisted navigation so as to achieve a 5 distal valgus cut. The femur was sized and it was felt that a size 6 femoral component was appropriate. A size 6 femoral cutting guide was positioned and the anterior cut was performed and verified using the computer. This was followed by completion of the posterior and chamfer cuts. Femoral cutting guide for the central box was then positioned in the center box cut was performed.  Attention was then directed to the proximal tibia. Medial and lateral menisci were excised. The extramedullary tibial cutting guide was positioned using computer-assisted navigation so as to achieve a 0 varus-valgus alignment and 3 posterior slope. The  cut was performed and verified using the  computer. The proximal tibia was sized and it was felt that a size 6 tibial tray was appropriate. Tibial and femoral trials were inserted followed by insertion of a 5 mm polyethylene insert. This allowed for excellent mediolateral soft tissue balancing both in flexion and in full extension. Finally, the patella was cut and prepared so as to accommodate a 38 mm medialized dome patella. A patella trial was placed and the knee was placed through a range of motion with excellent patellar tracking appreciated. The femoral trial was removed after debridement of posterior osteophytes. The central post-hole for the tibial component was reamed followed by insertion of a keel punch. Tibial trials were then removed. Cut surfaces of bone were irrigated with copious amounts of normal saline with antibiotic solution using pulsatile lavage and then suctioned dry. Polymethylmethacrylate cement was prepared in the usual fashion using a vacuum mixer. Cement was applied to the cut surface of the proximal tibia as well as along the undersurface of a size 6 rotating platform tibial component. Tibial component was positioned and impacted into place. Excess cement was removed using [employer] fast streamer. Cement was then applied to the cut surfaces of the femur as well as along the posterior flanges of the size 6 femoral component. The femoral component was positioned and impacted into place. Excess cement was removed using [employer] fast streamer. A 5 mm polyethylene trial was inserted and the knee was brought into full extension with steady axial compression applied. Finally, cement was applied to the backside of a 38 mm medialized dome patella and the patellar component was positioned and patellar clamp applied. Excess cement was removed using [employer] fast streamer. After adequate curing of the cement, the tourniquet was deflated after a total tourniquet time of 87 minutes. Hemostasis was achieved using  electrocautery. The knee was irrigated with copious amounts of normal saline with antibiotic solution using pulsatile lavage and then suctioned dry. 20 mL of 1.3% Exparel and 60 mL of 0.25% Marcaine in 40 mL of normal saline was injected along the posterior capsule, medial and lateral gutters, and along the arthrotomy site. A 5 mm stabilized rotating platform polyethylene insert was inserted and the knee was placed through a range of motion with excellent mediolateral soft tissue balancing appreciated and excellent patellar tracking noted. 2 medium drains were placed in the wound bed and brought out through separate stab incisions to be attached to a reinfusion system. The medial parapatellar portion of the incision was reapproximated using interrupted sutures of #1 Vicryl. Subcutaneous tissue was approximated in layers using first #0 Vicryl followed #2-0 Vicryl. The skin was approximated with skin staples. A sterile dressing was applied.  The patient tolerated the procedure well and was transported to the recovery room in stable condition.    Arhianna Ebey P. Holley Bouche., M.D.

## 2016-06-30 NOTE — Anesthesia Procedure Notes (Signed)
Spinal  Patient location during procedure: OR Start time: 06/30/2016 7:20 AM End time: 06/30/2016 7:27 AM Staffing Anesthesiologist: Emmie Niemann Performed: anesthesiologist  Preanesthetic Checklist Completed: patient identified, site marked, surgical consent, pre-op evaluation, timeout performed, IV checked, risks and benefits discussed and monitors and equipment checked Spinal Block Patient position: sitting Prep: ChloraPrep Patient monitoring: heart rate, continuous pulse ox, blood pressure and cardiac monitor Approach: midline Location: L4-5 Injection technique: single-shot Needle Needle type: Whitacre and Introducer  Needle gauge: 24 G Needle length: 9 cm Assessment Sensory level: T6 Additional Notes Negative paresthesia. Negative blood return. Positive free-flowing CSF. Expiration date of kit checked and confirmed. Patient tolerated procedure well, without complications.

## 2016-06-30 NOTE — Evaluation (Signed)
Physical Therapy Evaluation Patient Details Name: Ivan Reyes MRN: [id number] DOB: [birth year] Today's Date: 06/30/2016   History of Present Illness  Pt underwent L TKR without reported post-op complications. Pt is POD#0 at time of initial evaluation. No history of falls  Clinical Impression  Pt admitted with above diagnosis. Pt currently with functional limitations due to the deficits listed below (see PT Problem List). Pt demonstrates excellent strength and stability during bed mobility, transfers, and ambulation. He is able to perform a full SLR and SAQ without assistance. He does demonstrates slightly decreased L ankle dorsiflexion and L great toe extension strength, roughly 4-/5. Likely effects from spinal and will continue to monitor. Pt able to ambulate from bed to recliner with CGA only. Good sequencing with walker following instructions. Pt should be appropriate to return home at discharge with [facility] PT. Pt will benefit from skilled PT services to address deficits in strength, balance, and mobility in order to return to full function at home.     Follow Up Recommendations Home health PT    Equipment Recommendations  None recommended by PT    Recommendations for Other Services       Precautions / Restrictions Precautions Precautions: Knee;Fall Precaution Booklet Issued: Yes (comment) Required Braces or Orthoses: Knee Immobilizer - Left Knee Immobilizer - Left: Discontinue once straight leg raise with < 10 degree lag Restrictions Weight Bearing Restrictions: Yes LLE Weight Bearing: Weight bearing as tolerated      Mobility  Bed Mobility Overal bed mobility: Needs Assistance Bed Mobility: Supine to Sit     Supine to sit: Supervision     General bed mobility comments: Pt with excellent strength, speed and sequencing with bed mobility. Able to flex and abduct LLE without assist and eccentrically controls L knee flexion at EOB  Transfers Overall transfer level: Needs  assistance Equipment used: Rolling walker (2 wheeled) Transfers: Sit to/from Stand Sit to Stand: Min guard         General transfer comment: Pt with decreased weight shifting to LLE. Cues for safe hand placement. Increased time to come to standing but good anterior weight shifting. Steady and stable once upright in standing. Does not require UE support to remain in standing  Ambulation/Gait Ambulation/Gait assistance: Min guard Ambulation Distance (Feet): 5 Feet Assistive device: Rolling walker (2 wheeled) Gait Pattern/deviations: Step-to pattern;Decreased stance time - left;Decreased weight shift to left Gait velocity: Decreased Gait velocity interpretation: <1.8 ft/sec, indicative of risk for recurrent falls General Gait Details: Pt demonstrates stable ambulation from bed to recliner. Cues provided for sequencing during forward ambulation, turns, and backing up to recliner. No LLE buckling noted however minimal decrease in weight shifting to LLE  Stairs            Wheelchair Mobility    Modified Rankin (Stroke Patients Only)       Balance Overall balance assessment: Needs assistance Sitting-balance support: No upper extremity supported Sitting balance-Leahy Scale: Normal     Standing balance support: No upper extremity supported Standing balance-Leahy Scale: Fair Standing balance comment: Able to maintain balance without UE support on walker                             Pertinent Vitals/Pain Pain Assessment: 0-10 Pain Score: 1  Pain Location: L knee Pain Descriptors / Indicators: Aching Pain Intervention(s): Monitored during session;Premedicated before session    Home Living Family/patient expects to be discharged to:: Private residence Living Arrangements:  Alone Available Help at Discharge: Family Type of Home: House Home Access: Stairs to enter Entrance Stairs-Rails: Right;Left;Can reach both Entrance Stairs-Number of Steps: 1 Home Layout:  Multi-level;Able to live on main level with bedroom/bathroom Home Equipment: Walker - 2 wheels;Grab bars - tub/shower (no BSC, no wheelchair, no hospital bed)      Prior Function Level of Independence: Independent         Comments: Independent with ADLs/IADLs, drives. No falls. 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 LLE: denies N/T. Able to perform SLR and SAQ without assistance. Pt with full AROM DF against gravity. Mild DF and great toe extension weakness 4-/5 currently. Will continue to monitor     Communication   Communication: No difficulties  Cognition Arousal/Alertness: Awake/alert Behavior During Therapy: WFL for tasks assessed/performed Overall Cognitive Status: Within Functional Limits for tasks assessed                      General Comments      Exercises Total Joint Exercises Ankle Circles/Pumps: AROM;Both;10 reps;Supine Quad Sets: Strengthening;Both;10 reps;Supine Gluteal Sets: Strengthening;Both;10 reps;Supine Towel Squeeze: Strengthening;Both;10 reps;Supine Short Arc Quad: Strengthening;Left;10 reps;Supine Heel Slides: Strengthening;Left;10 reps;Supine Hip ABduction/ADduction: Strengthening;Left;10 reps;Supine Straight Leg Raises: Strengthening;Left;10 reps;Supine Goniometric ROM: 0-87 degrees AAROM, pain and dressing somewhat limiting for flexion   Assessment/Plan    PT Assessment Patient needs continued PT services  PT Problem List Decreased strength;Decreased balance;Decreased range of motion;Decreased mobility;Decreased knowledge of use of DME;Pain          PT Treatment Interventions DME instruction;Gait training;Stair training;Therapeutic  activities;Balance training;Therapeutic exercise;Neuromuscular re-education;Patient/family education;Manual techniques    PT Goals (Current goals can be found in the Care Plan section)  Acute Rehab PT Goals Patient Stated Goal: Return to prior level of function at home with less pain PT Goal Formulation: With patient Time For Goal Achievement: 07/14/16 Potential to Achieve Goals: Good    Frequency BID   Barriers to discharge        Co-evaluation               End of Session Equipment Utilized During Treatment: Gait belt Activity Tolerance: Patient tolerated treatment well Patient left: in chair;with call bell/phone within reach;with chair alarm set;with SCD's reapplied;Other (comment) (polar care in place, towel roll under heel) Nurse Communication: Mobility status;Other (comment) (Written on board)         Time: 3291-9166 PT Time Calculation (min) (ACUTE ONLY): 30 min   Charges:   PT Evaluation $PT Eval Low Complexity: 1 Procedure PT Treatments $Therapeutic Exercise: 8-22 mins   PT G Codes:       Ivan Reyes PT, DPT   Ivan Reyes 06/30/2016, 3:46 PM

## 2016-06-30 NOTE — H&P (Signed)
The patient has been re-examined, and the chart reviewed, and there have been no interval changes to the documented history and physical.    The risks, benefits, and alternatives have been discussed at length. The patient expressed understanding of the risks benefits and agreed with plans for surgical intervention.  Tashira Torre P. Taura Lamarre, Jr. M.D.    

## 2016-06-30 NOTE — Anesthesia Preprocedure Evaluation (Addendum)
Anesthesia Evaluation  Patient identified by MRN, date of birth, ID band Patient awake    Reviewed: Allergy & Precautions, NPO status , Patient's Chart, lab work & pertinent test results  History of Anesthesia Complications Negative for: history of anesthetic complications  Airway Mallampati: II  TM Distance: >3 FB Neck ROM: Full    Dental no notable dental hx.    Pulmonary sleep apnea and Continuous Positive Airway Pressure Ventilation , neg COPD, Current Smoker,    breath sounds clear to auscultation- rhonchi (-) wheezing      Cardiovascular Exercise Tolerance: Good hypertension, Pt. on medications + CAD and + Cardiac Stents  (-) Past MI  Rhythm:Regular Rate:Normal - Systolic murmurs and - Diastolic murmurs Echo [id number]: NORMAL LEFT VENTRICULAR SYSTOLIC FUNCTION WITH MILD LVH MILD VALVULAR REGURGITATION (See above) NO VALVULAR STENOSIS   Neuro/Psych negative neurological ROS  negative psych ROS   GI/Hepatic negative GI ROS, Neg liver ROS,   Endo/Other  negative endocrine ROSneg diabetes  Renal/GU negative Renal ROS     Musculoskeletal  (+) Arthritis ,   Abdominal (+) - obese,   Peds  Hematology negative hematology ROS (+)   Anesthesia Other Findings Past Medical History: No date: Arthritis No date: Coronary artery disease No date: Hyperlipidemia No date: Hypertension No date: Peripheral vascular disease (HCC) No date: Sleep apnea     Comment: use C-PAP   Reproductive/Obstetrics                             Anesthesia Physical Anesthesia Plan  ASA: II  Anesthesia Plan: Spinal   Post-op Pain Management:    Induction:   Airway Management Planned: Natural Airway  Additional Equipment:   Intra-op Plan:   Post-operative Plan:   Informed Consent: I have reviewed the patients History and Physical, chart, labs and discussed the procedure including the risks, benefits and  alternatives for the proposed anesthesia with the patient or authorized representative who has indicated his/her understanding and acceptance.   Dental advisory given  Plan Discussed with: CRNA and Anesthesiologist  Anesthesia Plan Comments: (Last dose of plavix was 06/22/16)       Lab Results  Component Value Date   WBC 6.7 06/16/2016   HGB 15.5 06/16/2016   HCT 44.7 06/16/2016   MCV 87.0 06/16/2016   PLT 164 06/16/2016    Anesthesia Quick Evaluation

## 2016-06-30 NOTE — Transfer of Care (Signed)
Immediate Anesthesia Transfer of Care Note  Patient: Ivan Reyes  Procedure(s) Performed: Procedure(s): COMPUTER ASSISTED TOTAL KNEE ARTHROPLASTY (Left)  Patient Location: PACU  Anesthesia Type:Spinal  Level of Consciousness: sedated  Airway & Oxygen Therapy: Patient Spontanous Breathing and Patient connected to nasal cannula oxygen  Post-op Assessment: Report given to RN and Post -op Vital signs reviewed and stable  Post vital signs: Reviewed and stable  Last Vitals:  Vitals:   06/30/16 0605 06/30/16 1043  BP: (!) 159/99 (!) 183/84  Pulse: 71 70  Resp: 16 14  Temp: 36.1 C 36.1 C    Last Pain:  Vitals:   06/30/16 1043  TempSrc:   PainSc: Asleep         Complications: No apparent anesthesia complications

## 2016-06-30 NOTE — Progress Notes (Signed)
Pt rates pain 5/10. Pt wants bone foam removed because he cannot tolerated it. Explained to pt it is necessary for hiim to keep the bone foam in place. He still wanted it out. Removed.

## 2016-07-01 LAB — CBC
HCT: 39.2 % — ABNORMAL LOW (ref 40.0–52.0)
HEMOGLOBIN: 13.3 g/dL (ref 13.0–18.0)
MCH: 30 pg (ref 26.0–34.0)
MCHC: 33.8 g/dL (ref 32.0–36.0)
MCV: 88.5 fL (ref 80.0–100.0)
Platelets: 152 10*3/uL (ref 150–440)
RBC: 4.43 MIL/uL (ref 4.40–5.90)
RDW: 16.3 % — AB (ref 11.5–14.5)
WBC: 7.8 10*3/uL (ref 3.8–10.6)

## 2016-07-01 LAB — BASIC METABOLIC PANEL
Anion gap: 5 (ref 5–15)
BUN: 16 mg/dL (ref 6–20)
CHLORIDE: 107 mmol/L (ref 101–111)
CO2: 24 mmol/L (ref 22–32)
CREATININE: 0.98 mg/dL (ref 0.61–1.24)
Calcium: 8.5 mg/dL — ABNORMAL LOW (ref 8.9–10.3)
GFR calc Af Amer: >60 mL/min (ref >60)
GFR calc non Af Amer: >60 mL/min (ref >60)
Glucose, Bld: 101 mg/dL — ABNORMAL HIGH (ref 65–99)
Potassium: 4.1 mmol/L (ref 3.5–5.1)
SODIUM: 136 mmol/L (ref 135–145)

## 2016-07-01 NOTE — Discharge Instructions (Signed)

## 2016-07-01 NOTE — Progress Notes (Signed)
Physical Therapy Treatment Patient Details Name: Ivan Reyes MRN: [id number] DOB: 1945-10-05 Today's Date: 07/01/2016    History of Present Illness Pt. is a  70 y.o. male who was admitted to [facility] for an elective Left TKR.    PT Comments    Pt up in chair and agreeable to PT; denies pain only stiffness. Pt educated in stretching and strengthening in long sit and seated for extension and flexion respectively including repetitions, duration and frequency. Pt also educated on open versus closed left knee position for joint edema management. Pt progressing ambulation well with excellent distance tolerance. Pt requires sequence and step length cues initially with good correction. Pt also demonstrates stair climbing with need for sequence education. Continue PT this afternoon if pt continues admitted to continue progression of range, strength to improve all functional mobility to allow for an optimal, safe return home.   Follow Up Recommendations  Home health PT     Equipment Recommendations  None recommended by PT    Recommendations for Other Services       Precautions / Restrictions Precautions Precautions: Fall;Knee Restrictions Weight Bearing Restrictions: No LLE Weight Bearing: Weight bearing as tolerated    Mobility  Bed Mobility               General bed mobility comments: Not tested; up in chair  Transfers Overall transfer level: Modified independent   Transfers: Sit to/from Stand Sit to Stand: Modified independent (Device/Increase time)         General transfer comment: Encouraged to wt shift R/L once standing to ensure L knee ext with weight bearing  Ambulation/Gait Ambulation/Gait assistance: Min guard;Supervision Ambulation Distance (Feet): 310 Feet Assistive device: Rolling walker (2 wheeled) Gait Pattern/deviations: Step-through pattern Gait velocity: Decreased Gait velocity interpretation: Below normal speed for age/gender General Gait  Details: Initial step to, improved to step through with cues   Stairs Stairs: Yes Stairs assistance: Min guard Stair Management: Two rails;Forwards;Step to pattern Number of Stairs: 4 General stair comments: Cues for sequence  Wheelchair Mobility    Modified Rankin (Stroke Patients Only)       Balance Overall balance assessment: Modified Independent                                  Cognition Arousal/Alertness: Awake/alert Behavior During Therapy: WFL for tasks assessed/performed Overall Cognitive Status: Within Functional Limits for tasks assessed                      Exercises Total Joint Exercises Quad Sets: Strengthening;Both;20 reps (long sit and stand) Knee Flexion: AROM;AAROM;Left;10 reps;Seated (3 positions each rep with 10 sec hold each) Goniometric ROM: 0-83    General Comments        Pertinent Vitals/Pain Pain Assessment: 0-10 Pain Score: 1  Pain Location: Left Knee Pain Descriptors / Indicators: Aching Pain Intervention(s): Monitored during session;Premedicated before session    Home Living Family/patient expects to be discharged to:: Private residence Living Arrangements: Alone Available Help at Discharge: Family Type of Home: House Home Access: Stairs to enter Entrance Stairs-Rails: Right;Left;Can reach both Home Layout: Able to live on main level with bedroom/bathroom;Two level Home Equipment: Walker - 2 wheels;Grab bars - tub/shower      Prior Function Level of Independence: Independent          PT Goals (current goals can now be found in the care plan section) Acute Rehab PT  Goals Patient Stated Goal: To regaine independence Progress towards PT goals: Progressing toward goals    Frequency    BID      PT Plan Current plan remains appropriate    Co-evaluation             End of Session Equipment Utilized During Treatment: Gait belt Activity Tolerance: Patient tolerated treatment well Patient left:  in chair;with call bell/phone within reach;with chair alarm set;with SCD's reapplied;Other (comment) (polar care in place)     Time: 1021-1057 PT Time Calculation (min) (ACUTE ONLY): 36 min  Charges:  $Gait Training: 8-22 mins $Therapeutic Exercise: 8-22 mins                    G Codes:      Larae Grooms, PTA 07/01/2016, 11:06 AM

## 2016-07-01 NOTE — Progress Notes (Signed)
[place] rounding visited Pt. Pt was in good spirit, and was watching TV at the time of this visit. [place] talked with Pt about the his health; Pt told Ch, he was doing well and wanted to go home today but the doctors refused to let him. [place] told Pt to trust the doctor's judgement and wait for them to decide when he would be discharged. Also, [place] assured Pt that if he needed help, [facility] was available.   07/01/16 1500  Clinical Encounter Type  Visited With Patient  Visit Type Initial  Spiritual Encounters  Spiritual Needs Prayer

## 2016-07-01 NOTE — Anesthesia Postprocedure Evaluation (Signed)
Anesthesia Post Note  Patient: Ivan Reyes  Procedure(s) Performed: Procedure(s) (LRB): COMPUTER ASSISTED TOTAL KNEE ARTHROPLASTY (Left)  Patient location during evaluation: Nursing Unit Anesthesia Type: Spinal Level of consciousness: awake, awake and alert and oriented Pain management: pain level controlled Vital Signs Assessment: post-procedure vital signs reviewed and stable Respiratory status: spontaneous breathing, nonlabored ventilation and respiratory function stable Cardiovascular status: stable Postop Assessment: no headache and no backache Anesthetic complications: no    Last Vitals:  Vitals:   06/30/16 2349 07/01/16 0424  BP: 125/66 114/63  Pulse: 76 68  Resp: 16 16  Temp: 37.2 C 36.1 C    Last Pain:  Vitals:   07/01/16 0635  TempSrc:   PainSc: 3                  Twinkle [place],  Karalee Hauter R

## 2016-07-01 NOTE — Progress Notes (Signed)
   Subjective: 1 Day Post-Op Procedure(s) (LRB): COMPUTER ASSISTED TOTAL KNEE ARTHROPLASTY (Left) Patient reports pain as 4 on 0-10 scale.   Patient is well, and has had no acute complaints or problems We will start therapy today.  Plan is to go Home after hospital stay. no nausea and no vomiting Patient denies any chest pains or shortness of breath. Patient resting very well. Intermittent sleep on the night. Pain well controlled. Objective: Vital signs in last 24 hours: Temp:  [97 F (36.1 C)-99 F (37.2 C)] 97 F (36.1 C) (11/16 0424) Pulse Rate:  [50-76] 54 (11/16 0732) Resp:  [0-18] 16 (11/16 0732) BP: (114-183)/(55-93) 121/55 (11/16 0732) SpO2:  [96 %-100 %] 98 % (11/16 0732) Weight:  [83 kg (183 lb)] 83 kg (183 lb) (11/15 1344) Heels are non tender and elevated off the bed using rolled towels with bone foam under the left heel. Patient not tolerating bone foam well.  Intake/Output from previous day: 11/15 0701 - 11/16 0700 In: 4075 [P.O.:240; I.V.:3435; IV Piggyback:300] Out: [id number] [Urine:4025; Drains:400; Blood:50] Intake/Output this shift: No intake/output data recorded.   Recent Labs  07/01/16 0424  HGB 13.3    Recent Labs  07/01/16 0424  WBC 7.8  RBC 4.43  HCT 39.2*  PLT 152    Recent Labs  07/01/16 0424  NA 136  K 4.1  CL 107  CO2 24  BUN 16  CREATININE 0.98  GLUCOSE 101*  CALCIUM 8.5*   No results for input(s): LABPT, INR in the last 72 hours.  EXAM General - Patient is Alert, Appropriate and Oriented Extremity - Neurologically intact Neurovascular intact Sensation intact distally Intact pulses distally Dorsiflexion/Plantar flexion intact No cellulitis present Compartment soft Dressing - dressing C/D/I Motor Function - intact, moving foot and toes well on exam. Patient able to do straight leg raise on his own  Past Medical History:  Diagnosis Date  . Arthritis   . Coronary artery disease   . Hyperlipidemia   . Hypertension   .  Peripheral vascular disease ([place])   . Sleep apnea    use C-PAP    Assessment/Plan: 1 Day Post-Op Procedure(s) (LRB): COMPUTER ASSISTED TOTAL KNEE ARTHROPLASTY (Left) Active Problems:   S/P total knee arthroplasty  Estimated body mass index is 25.52 kg/m as calculated from the following:   Height as of this encounter: 5\' 11"  (1.803 m).   Weight as of this encounter: 83 kg (183 lb). Advance diet Up with therapy D/C IV fluids Plan for discharge tomorrow Discharge home with home health  Labs: Were reviewed and within normal limits DVT Prophylaxis - Lovenox, Foot Pumps and TED hose Weight-Bearing as tolerated to left leg D/C O2 and Pulse OX and try on Room Air Begin working on bowel movement Labs tomorrow morning  Jon R. [place] 07/01/2016, 7:43 AM

## 2016-07-01 NOTE — Progress Notes (Signed)
Pt up in chair at the beginning of shift. Pain managed with medication ordered. Adequate of urine. VSS. Foley removed this am

## 2016-07-01 NOTE — Progress Notes (Signed)
Clinical Social Worker (CSW) received SNF consult. PT is recommending home health. RN case manager is aware of above. Please reconsult if future social work needs arise. CSW signing off.   Mozell Haber, LCSW [phone] 

## 2016-07-01 NOTE — Progress Notes (Signed)
Pt refusing bone foam.

## 2016-07-01 NOTE — Discharge Summary (Signed)
Physician Discharge Summary  Patient ID: Ivan Reyes MRN: [id number] DOB/AGE: 04/09/1946 70 y.o.  Admit date: 06/30/2016 Discharge date: 07/02/2016  Admission Diagnoses:  S/P total knee arthroplasty [Z96.659]   Discharge Diagnoses: Patient Active Problem List   Diagnosis Date Noted  . S/P total knee arthroplasty 06/30/2016    Past Medical History:  Diagnosis Date  . Arthritis   . Coronary artery disease   . Hyperlipidemia   . Hypertension   . Peripheral vascular disease ([place])   . Sleep apnea    use C-PAP     Transfusion: Autovac transfusions given the first 6 hours postoperatively   Consultants (if any):  none  Discharged Condition: Improved  Hospital Course: Ivan Reyes is an 70 y.o. male who was admitted 06/30/2016 with a diagnosis of degenerative arthrosis left knee and went to the operating room on 06/30/2016 and underwent the above named procedures.    Surgeries:Procedure(s): COMPUTER ASSISTED TOTAL KNEE ARTHROPLASTY on 06/30/2016  PRE-OPERATIVE DIAGNOSIS: Degenerative arthrosis of the left knee, primary  POST-OPERATIVE DIAGNOSIS:  Same  PROCEDURE:  Left total knee arthroplasty using computer-assisted navigation  SURGEON:  Marciano Sequin. M.D.  ASSISTANT:  Vance Peper, PA (present and scrubbed throughout the case, critical for assistance with exposure, retraction, instrumentation, and closure)  ANESTHESIA: spinal  ESTIMATED BLOOD LOSS: 50 mL  FLUIDS REPLACED: 1450 mL of crystalloid  TOURNIQUET TIME: 87 minutes  DRAINS: 2 medium drains to a reinfusion system  SOFT TISSUE RELEASES: Anterior cruciate ligament, posterior cruciate ligament, deep  medial collateral ligament, patellofemoral ligament  IMPLANTS UTILIZED: DePuy Attune size 6 posterior stabilized femoral component (cemented), size 6 rotating platform tibial component (cemented), 38 mm medialized dome patella (cemented), and a 5 mm stabilized rotating platform  polyethylene insert.  INDICATIONS FOR SURGERY: Ivan Reyes is a 70 y.o. year old male with a long history of progressive knee pain. X-rays demonstrated severe degenerative changes in tricompartmental fashion. The patient had not seen any significant improvement despite conservative nonsurgical intervention. After discussion of the risks and benefits of surgical intervention, the patient expressed understanding of the risks benefits and agree with plans for total knee arthroplasty.   The risks, benefits, and alternatives were discussed at length including but not limited to the risks of infection, bleeding, nerve injury, stiffness, blood clots, the need for revision surgery, cardiopulmonary complications, among others, and they were willing to proceed. Patient tolerated the surgery well. No complications .Patient was taken to PACU where she was stabilized and then transferred to the orthopedic floor.  Patient started on Lovenox 30 mg q 12 hrs. Foot pumps applied bilaterally at 80 mm hgb. Heels elevated off bed with rolled towels. No evidence of DVT. Calves non tender. Negative Homan. Physical therapy started on day #1 for gait training and transfer with OT starting on  day #1 for ADL and assisted devices. Patient has done well with therapy. Ambulated greater than 200 feet upon being discharged. Was able to go up and down 4 steps safely and independently  Patient's IV and Foley were discontinued on day #1 with Hemovac being discontinued on day #2 along with dressing change.   He was given perioperative antibiotics:  Anti-infectives    Start     Dose/Rate Route Frequency Ordered Stop   06/30/16 1300  ceFAZolin (ANCEF) IVPB 2g/100 mL premix     2 g 200 mL/hr over 30 Minutes Intravenous Every 6 hours 06/30/16 1204 07/01/16 0642   06/30/16 0554  ceFAZolin (ANCEF) 2-4 GM/100ML-% IVPB  CommentsMerrilyn Puma, ASHLEY: cabinet override      06/30/16 0554 06/30/16 1759   06/30/16 0000  ceFAZolin  (ANCEF) IVPB 2g/100 mL premix  Status:  Discontinued     2 g 200 mL/hr over 30 Minutes Intravenous On call to O.R. 06/29/16 2347 06/30/16 1204    .  He was fitted with AV 1 compression foot pump devices, instructed on heel pumps, early ambulation, and TED stockings bilaterally for DVT prophylaxis.  He benefited maximally from the hospital stay and there were no complications.    Recent vital signs:  Vitals:   07/01/16 0424 07/01/16 0732  BP: 114/63 (!) 121/55  Pulse: 68 (!) 54  Resp: 16 16  Temp: 97 F (36.1 C)     Recent laboratory studies:  Lab Results  Component Value Date   HGB 13.3 07/01/2016   HGB 15.5 06/16/2016   Lab Results  Component Value Date   WBC 7.8 07/01/2016   PLT 152 07/01/2016   Lab Results  Component Value Date   INR 0.92 06/16/2016   Lab Results  Component Value Date   NA 136 07/01/2016   K 4.1 07/01/2016   CL 107 07/01/2016   CO2 24 07/01/2016   BUN 16 07/01/2016   CREATININE 0.98 07/01/2016   GLUCOSE 101 (H) 07/01/2016    Discharge Medications:     Medication List    TAKE these medications   clopidogrel 75 MG tablet Commonly known as:  PLAVIX Take 75 mg by mouth daily.   enalapril 20 MG tablet Commonly known as:  VASOTEC Take 20 mg by mouth 2 (two) times daily.   enoxaparin 40 MG/0.4ML injection Commonly known as:  LOVENOX Inject 0.4 mLs (40 mg total) into the skin daily.   fenofibrate micronized 134 MG capsule Commonly known as:  LOFIBRA Take 134 mg by mouth at bedtime.   Fish Oil 1000 MG Caps Take 1 capsule by mouth 2 (two) times daily.   GLUCOSAMINE CHONDROITIN ADV Tabs Take 1 tablet by mouth 2 (two) times daily.   hydrochlorothiazide 12.5 MG tablet Commonly known as:  HYDRODIURIL Take 12.5 mg by mouth daily.   oxyCODONE 5 MG immediate release tablet Commonly known as:  Oxy IR/ROXICODONE Take 1-2 tablets (5-10 mg total) by mouth every 4 (four) hours as needed for severe pain or breakthrough pain.   simvastatin  40 MG tablet Commonly known as:  ZOCOR Take 40 mg by mouth daily.   traMADol 50 MG tablet Commonly known as:  ULTRAM Take 1-2 tablets (50-100 mg total) by mouth every 4 (four) hours as needed for moderate pain.   vitamin B-12 500 MCG tablet Commonly known as:  CYANOCOBALAMIN Take 500 mcg by mouth daily.   Vitamin D3 3000 units Tabs Take 2,000 Units by mouth daily.   vitamin E 400 UNIT capsule Take 400 Units by mouth daily.            Durable Medical Equipment        Start     Ordered   06/30/16 1205  DME Walker rolling  Once     06/30/16 1204   06/30/16 1205  DME Bedside commode  Once     06/30/16 1204      Diagnostic Studies: Dg Knee Left Port  Result Date: 06/30/2016 CLINICAL DATA:  Post left total knee replacement EXAM: PORTABLE LEFT KNEE - 1-2 VIEW COMPARISON:  None. FINDINGS: Post left total knee replacement. Alignment appears anatomic. No evidence of hardware failure loosening. There is  a minimal amount of expected scattered subcutaneous emphysema about the operative site. Expected small postoperative joint effusion. A surgical drain is noted about the operative site. Exuberant vascular calcifications. No radiopaque foreign body. IMPRESSION: Post left total knee replacement without evidence of complication. Electronically Signed   By: Sandi Mariscal M.D.   On: 06/30/2016 11:14    Disposition:     Follow-up Information    WOLFE,JON R., PA On 07/13/2016.   Specialty:  Physician Assistant Why:  at 1:45pm Contact information: [place] [address] [phone]        Dereck Leep, MD On 08/10/2016.   Specialty:  Orthopedic Surgery Why:  at 9:30am Contact information: [place] [address] [phone]            Signed: Watt Climes. 07/01/2016, 7:51 AM

## 2016-07-01 NOTE — Evaluation (Signed)
Occupational Therapy Evaluation Patient Details Name: Ivan Reyes MRN: [id number] DOB: 12-Jul-1946 Today's Date: 07/01/2016    History of Present Illness Pt. is a  70 y.o. male who was admitted to [facility] for an elective Left TKR.   Clinical Impression   Pt. Is a 70 y.o. male who was admitted to [facility] for an elective Left TKR. Pt. is able to reach his LEs to perform self-care skills without difficulty. Pt. has no further questions or concerns regarding OT services, or self-care at this time. Pt. Is appropriate for discharge form OT services at this time.    Follow Up Recommendations  No OT follow up    Equipment Recommendations       Recommendations for Other Services PT consult     Precautions / Restrictions Precautions Precautions: Fall;Knee Restrictions Weight Bearing Restrictions: No LLE Weight Bearing: Weight bearing as tolerated                                                     ADL Overall ADL's : Needs assistance/impaired Eating/Feeding: Set up   Grooming: Set up               Lower Body Dressing: Independent                 General ADL Comments: Pt. is independently able to reach down to complete LE ADLs without difficulty.     Vision     Perception     Praxis      Pertinent Vitals/Pain Pain Assessment: 0-10 Pain Score: 1  Pain Location: Left Knee Pain Descriptors / Indicators: Aching Pain Intervention(s): Monitored during session;Premedicated before session     Hand Dominance Right   Extremity/Trunk Assessment Upper Extremity Assessment Upper Extremity Assessment: Overall WFL for tasks assessed           Communication Communication Communication: No difficulties   Cognition Arousal/Alertness: Awake/alert Behavior During Therapy: WFL for tasks assessed/performed Overall Cognitive Status: Within Functional Limits for tasks assessed                     General Comments        Exercises       Shoulder Instructions      Home Living Family/patient expects to be discharged to:: Private residence Living Arrangements: Alone Available Help at Discharge: Family Type of Home: House Home Access: Stairs to enter   Entrance Stairs-Rails: Right;Left;Can reach both Home Layout: Able to live on main level with bedroom/bathroom;Two level     Bathroom Shower/Tub: Tub/shower unit Shower/tub characteristics: Curtain       Home Equipment: Environmental consultant - 2 wheels;Grab bars - tub/shower          Prior Functioning/Environment Level of Independence: Independent                 OT Problem List:     OT Treatment/Interventions:      OT Goals(Current goals can be found in the care plan section) Acute Rehab OT Goals Patient Stated Goal: To regaine independence OT Goal Formulation: With patient Potential to Achieve Goals: Good  OT Frequency:     Barriers to D/C:            Co-evaluation              End of Session  Activity Tolerance: Patient tolerated treatment well Patient left: in chair;with call bell/phone within reach   Time: 0950-1005 OT Time Calculation (min): 15 min Charges:  OT General Charges $OT Visit: 1 Procedure OT Evaluation $OT Eval Moderate Complexity: 1 Procedure G-Codes:    Harrel Carina, MS, OTR/L 07/01/2016, 10:59 AM

## 2016-07-01 NOTE — Care Management Note (Signed)
Case Management Note  Patient Details  Name: TANUSH DREES MRN: [id number] Date of Birth: [birth year]  Subjective/Objective:  POD # 1 left TKA. Met with patient at bedside to discuss discharge plan. Patient states he lives alone with friends and family support. He has a front wheeled walker. Uses a CPAP at bedtime. Discussed home health and he is agreeable. Prefers to use [facility]. PCP is Dr. Gilford Rile at [facility].  Pharmacy is [facility]  [phone]. Called Lovenox 40 mg # 14, no refills.           Action/Plan: Referral called to [facility], Lovenox called in. No DME needs.   Expected Discharge Date:                  Expected Discharge Plan:  [facility]  In-House Referral:     Discharge planning Services     Post Acute Care Choice:  Home Health Choice offered to:  Patient  DME Arranged:    DME Agency:     HH Arranged:  PT [place]:  [facility] (now [facility])  Status of Service:  In process, will continue to follow  If discussed at Long Length of Stay Meetings, dates discussed:    Additional Comments:  Jolly Mango, RN 07/01/2016, 1:57 PM

## 2016-07-01 NOTE — Progress Notes (Signed)
Pt getting up without assistance. Pt notified that he needs to call us and should not get up without a staff member for safety reason. Pt verbalized understanding

## 2016-07-01 NOTE — Progress Notes (Signed)
Physical Therapy Treatment Patient Details Name: Ivan Reyes MRN: [id number] DOB: [birth year] Today's Date: 07/01/2016    History of Present Illness Pt underwent L TKR without reported post-op complications. Pt is POD#0 at time of initial evaluation. No history of falls    PT Comments    Pt initially sleeping in bed; easily awoken and agreeable to PT. Denies left knee pain. Pt mildly impulsive throughout session with sit to stand transfers and ambulation in room. Explanation on safety and encouraged greater attention to safety to prevent falls. Pt continues to ambulate fairly well; encouraged greater quad set/L knee straightening with left stance phase. Initiated left stand exercises. Pt wished return to bed post session and does so independently. Continue PT to progress strength, endurance and overall safety to improve all functional mobility and allow for and optimal, safe return home.   Follow Up Recommendations  Home health PT     Equipment Recommendations  None recommended by PT    Recommendations for Other Services       Precautions / Restrictions Precautions Precautions: Knee;Fall Restrictions Weight Bearing Restrictions: No LLE Weight Bearing: Weight bearing as tolerated    Mobility  Bed Mobility Overal bed mobility: Independent Bed Mobility: Sit to Supine     Supine to sit: Independent Sit to supine: Independent   General bed mobility comments: No issues  Transfers Overall transfer level: Modified independent     Sit to Stand: Modified independent (Device/Increase time)         General transfer comment: Encouraged to wt shift R/L once standing to ensure L knee ext with weight bearing. mildly impulsive; encouraged to slow and pay attention to safety  Ambulation/Gait Ambulation/Gait assistance: Min guard;Supervision Ambulation Distance (Feet): 250 Feet Assistive device: Rolling walker (2 wheeled) Gait Pattern/deviations: Step-through pattern Gait  velocity: Decreased Gait velocity interpretation: Below normal speed for age/gender General Gait Details: pt demonstrates some safety inssues in the room lifting rw over obstacles versus maneuvering around and/or pushing objects with rw. Encouraged allowing path to be cleared and keeping rw safely on the ground   Stairs            Wheelchair Mobility    Modified Rankin (Stroke Patients Only)       Balance                                    Cognition Arousal/Alertness: Awake/alert Behavior During Therapy: WFL for tasks assessed/performed Overall Cognitive Status: Within Functional Limits for tasks assessed                      Exercises Total Joint Exercises Quad Sets: Strengthening;Both;20 reps;Standing (long sit and stand) Hip ABduction/ADduction: Strengthening;Left;15 reps;Standing Straight Leg Raises: Strengthening;Left;15 reps;Standing Knee Flexion: Left;Strengthening;15 reps;Standing Marching in Standing: Strengthening;Left;15 reps;Standing Standing Hip Extension: Strengthening;Left;15 reps;Standing    General Comments        Pertinent Vitals/Pain Pain Assessment: No/denies pain    Home Living                      Prior Function            PT Goals (current goals can now be found in the care plan section) Progress towards PT goals: Progressing toward goals    Frequency    BID      PT Plan Current plan remains appropriate    Co-evaluation  End of Session Equipment Utilized During Treatment: Gait belt Activity Tolerance: Patient tolerated treatment well Patient left: with call bell/phone within reach;with SCD's reapplied;Other (comment);in bed;with bed alarm set (polar care in place)     Time: [id number] PT Time Calculation (min) (ACUTE ONLY): 24 min  Charges:  $Gait Training: 8-22 mins $Therapeutic Exercise: 8-22 mins                    G Codes:      Larae Grooms, PTA 07/01/2016, 3:05  PM

## 2016-07-02 LAB — BASIC METABOLIC PANEL
Anion gap: 5 (ref 5–15)
BUN: 13 mg/dL (ref 6–20)
CALCIUM: 8.7 mg/dL — AB (ref 8.9–10.3)
CHLORIDE: 103 mmol/L (ref 101–111)
CO2: 27 mmol/L (ref 22–32)
CREATININE: 0.82 mg/dL (ref 0.61–1.24)
GFR calc Af Amer: >60 mL/min (ref >60)
GFR calc non Af Amer: >60 mL/min (ref >60)
Glucose, Bld: 95 mg/dL (ref 65–99)
Potassium: 3.9 mmol/L (ref 3.5–5.1)
SODIUM: 135 mmol/L (ref 135–145)

## 2016-07-02 LAB — CBC
HCT: 35.9 % — ABNORMAL LOW (ref 40.0–52.0)
HEMOGLOBIN: 12.6 g/dL — AB (ref 13.0–18.0)
MCH: 30.8 pg (ref 26.0–34.0)
MCHC: 35.1 g/dL (ref 32.0–36.0)
MCV: 87.8 fL (ref 80.0–100.0)
Platelets: 151 10*3/uL (ref 150–440)
RBC: 4.09 MIL/uL — ABNORMAL LOW (ref 4.40–5.90)
RDW: 15.8 % — AB (ref 11.5–14.5)
WBC: 8.7 10*3/uL (ref 3.8–10.6)

## 2016-07-02 MED ORDER — OXYCODONE HCL 5 MG PO TABS: 5.0000 mg | ORAL_TABLET | ORAL | 0 refills | Status: DC | PRN

## 2016-07-02 MED ORDER — ENOXAPARIN SODIUM 40 MG/0.4ML ~~LOC~~ SOLN: 40.0000 mg | SUBCUTANEOUS | 0 refills | Status: DC

## 2016-07-02 MED ORDER — LACTULOSE 10 GM/15ML PO SOLN: 10.0000 g | Freq: Two times a day (BID) | ORAL | Status: DC | PRN

## 2016-07-02 MED ORDER — TRAMADOL HCL 50 MG PO TABS: 50.0000 mg | ORAL_TABLET | ORAL | 0 refills | Status: DC | PRN

## 2016-07-02 NOTE — Progress Notes (Signed)
   Subjective: 2 Days Post-Op Procedure(s) (LRB): COMPUTER ASSISTED TOTAL KNEE ARTHROPLASTY (Left) Patient reports pain as mild.   Patient is well, and has had no acute complaints or problems Continue with physical therapy today.  Plan is to go Home after hospital stay. no nausea and no vomiting Patient denies any chest pains or shortness of breath. Objective: Vital signs in last 24 hours: Temp:  [97.8 F (36.6 C)-97.9 F (36.6 C)] 97.8 F (36.6 C) (11/17 0342) Pulse Rate:  [54-86] 86 (11/17 0342) Resp:  [16-18] 16 (11/17 0342) BP: (121-136)/(55-89) 136/89 (11/17 0342) SpO2:  [96 %-98 %] 96 % (11/17 0342) well approximated incision Heels are non tender and elevated off the bed using rolled towels Intake/Output from previous day: 11/16 0701 - 11/17 0700 In: 1080 [P.O.:840] Out: 200 [Drains:200] Intake/Output this shift: No intake/output data recorded.   Recent Labs  07/01/16 0424 07/02/16 0426  HGB 13.3 12.6*    Recent Labs  07/01/16 0424 07/02/16 0426  WBC 7.8 8.7  RBC 4.43 4.09*  HCT 39.2* 35.9*  PLT 152 151    Recent Labs  07/01/16 0424 07/02/16 0426  NA 136 135  K 4.1 3.9  CL 107 103  CO2 24 27  BUN 16 13  CREATININE 0.98 0.82  GLUCOSE 101* 95  CALCIUM 8.5* 8.7*   No results for input(s): LABPT, INR in the last 72 hours.  EXAM General - Patient is Alert, Appropriate and Oriented Extremity - Neurologically intact Neurovascular intact Sensation intact distally Intact pulses distally Dorsiflexion/Plantar flexion intact No cellulitis present Compartment soft Dressing - scant drainage Motor Function - intact, moving foot and toes well on exam.    Past Medical History:  Diagnosis Date  . Arthritis   . Coronary artery disease   . Hyperlipidemia   . Hypertension   . Peripheral vascular disease ([place])   . Sleep apnea    use C-PAP    Assessment/Plan: 2 Days Post-Op Procedure(s) (LRB): COMPUTER ASSISTED TOTAL KNEE ARTHROPLASTY  (Left) Active Problems:   S/P total knee arthroplasty  Estimated body mass index is 25.52 kg/m as calculated from the following:   Height as of this encounter: 5\' 11"  (1.803 m).   Weight as of this encounter: 83 kg (183 lb). Up with therapy Discharge home with home health  Labs: reviewed  DVT Prophylaxis - Lovenox, Foot Pumps and TED hose Weight-Bearing as tolerated to left leg hemovac discontinued Please change dressing prior to d/c   Jon R. [place] 07/02/2016, 7:20 AM

## 2016-07-02 NOTE — Care Management Important Message (Signed)
Important Message  Patient Details  Name: Ivan Reyes MRN: [id number] Date of Birth: Oct 04, 1945   Medicare Important Message Given:  Yes    Jolly Mango, RN 07/02/2016, 8:59 AM

## 2016-07-02 NOTE — Progress Notes (Signed)
Discharge instructions discussed with patient. Scripts given. Lovenox teaching given, pt verbalizes understanding. Lovenox kit given.

## 2016-07-02 NOTE — Progress Notes (Signed)
Physical Therapy Treatment Patient Details Name: DELVECCHIO WOLBERS MRN: [id number] DOB: Oct 19, 1945 Today's Date: 07/02/2016    History of Present Illness Pt underwent L TKR without reported post-op complications. Pt is POD#0 at time of initial evaluation. No history of falls    PT Comments    Pt continues to demonstrate independent or Mod independence with transfers and gait.  Pt working this session on functional ROM during lower body dressing and on quad activation to promote extension.  Pt education provided onon QS and using extension foam in bed to promote further extension of knee for improved gait pattern.  Cont with POC.   Follow Up Recommendations  Home health PT     Equipment Recommendations  None recommended by PT    Recommendations for Other Services       Precautions / Restrictions Precautions Precautions: Knee;Fall Restrictions LLE Weight Bearing: Weight bearing as tolerated    Mobility  Bed Mobility Overal bed mobility: Independent Bed Mobility: Sit to Supine     Supine to sit: Independent Sit to supine: Independent   General bed mobility comments: No issues, HOB flat, no rails  Transfers Overall transfer level: Modified independent Equipment used: Rolling walker (2 wheeled) Transfers: Sit to/from Stand Sit to Stand: Modified independent (Device/Increase time)         General transfer comment: Rises from bed with increased R weight shift, using one hand on support (bed or RW) x 5 reps  Ambulation/Gait Ambulation/Gait assistance: Modified independent (Device/Increase time) Ambulation Distance (Feet): 280 Feet Assistive device: Rolling walker (2 wheeled) Gait Pattern/deviations: Step-through pattern;Decreased step length - right     General Gait Details: Good safety awareness with ambulation, negotiating objects in hallway and on floors without assist; working on keeping gaze upright and not looking down towards feet.  Cues to keep walker  about 6 inches forward of thighs.   Stairs   Stairs assistance: Modified independent (Device/Increase time)   Number of Stairs: 4 General stair comments: Able to recall sequencing without cues  Wheelchair Mobility    Modified Rankin (Stroke Patients Only)       Balance                                    Cognition Arousal/Alertness: Awake/alert Behavior During Therapy: WFL for tasks assessed/performed Overall Cognitive Status: Within Functional Limits for tasks assessed                      Exercises Total Joint Exercises Goniometric ROM: 5-78 Other Exercises Other Exercises: Focus on quad activation with QS and SLR; pt with ext lag with SLR's; HS, ABB/ADD, QS x 10 reps each Other Exercises: Dressing EOB, able to don all clothes with Mod I except for L sock, requiring Min A     General Comments General comments (skin integrity, edema, etc.): dressing intact      Pertinent Vitals/Pain Pain Assessment: No/denies pain    Home Living                      Prior Function            PT Goals (current goals can now be found in the care plan section) Acute Rehab PT Goals Patient Stated Goal: To regain independence PT Goal Formulation: With patient Time For Goal Achievement: 07/14/16 Potential to Achieve Goals: Good Progress towards PT goals: Progressing toward goals  Frequency    BID      PT Plan Current plan remains appropriate    Co-evaluation             End of Session   Activity Tolerance: Patient tolerated treatment well Patient left: in chair;with call bell/phone within reach     Time: 0930-1001 PT Time Calculation (min) (ACUTE ONLY): 31 min  Charges:  $Gait Training: 8-22 mins $Therapeutic Exercise: 8-22 mins                    G Codes:      Jahquan Klugh A Ellean Firman, PT 2016/07/05, 10:11 AM

## 2016-07-04 DIAGNOSIS — Z79891 Long term (current) use of opiate analgesic: Secondary | ICD-10-CM | POA: Diagnosis not present

## 2016-07-04 DIAGNOSIS — Z7901 Long term (current) use of anticoagulants: Secondary | ICD-10-CM | POA: Diagnosis not present

## 2016-07-04 DIAGNOSIS — Z471 Aftercare following joint replacement surgery: Secondary | ICD-10-CM | POA: Diagnosis not present

## 2016-07-04 DIAGNOSIS — I2510 Atherosclerotic heart disease of native coronary artery without angina pectoris: Secondary | ICD-10-CM | POA: Diagnosis not present

## 2016-07-04 DIAGNOSIS — E785 Hyperlipidemia, unspecified: Secondary | ICD-10-CM | POA: Diagnosis not present

## 2016-07-04 DIAGNOSIS — Z96652 Presence of left artificial knee joint: Secondary | ICD-10-CM | POA: Diagnosis not present

## 2016-07-04 DIAGNOSIS — I10 Essential (primary) hypertension: Secondary | ICD-10-CM | POA: Diagnosis not present

## 2016-07-04 DIAGNOSIS — I739 Peripheral vascular disease, unspecified: Secondary | ICD-10-CM | POA: Diagnosis not present

## 2016-07-04 DIAGNOSIS — Z7902 Long term (current) use of antithrombotics/antiplatelets: Secondary | ICD-10-CM | POA: Diagnosis not present

## 2016-07-13 DIAGNOSIS — R29898 Other symptoms and signs involving the musculoskeletal system: Secondary | ICD-10-CM | POA: Diagnosis not present

## 2016-07-13 DIAGNOSIS — M25662 Stiffness of left knee, not elsewhere classified: Secondary | ICD-10-CM | POA: Diagnosis not present

## 2016-07-13 DIAGNOSIS — Z96652 Presence of left artificial knee joint: Secondary | ICD-10-CM | POA: Diagnosis not present

## 2016-07-13 DIAGNOSIS — M25562 Pain in left knee: Secondary | ICD-10-CM | POA: Diagnosis not present

## 2016-07-15 DIAGNOSIS — R29898 Other symptoms and signs involving the musculoskeletal system: Secondary | ICD-10-CM | POA: Diagnosis not present

## 2016-07-15 DIAGNOSIS — M25562 Pain in left knee: Secondary | ICD-10-CM | POA: Diagnosis not present

## 2016-07-15 DIAGNOSIS — Z96652 Presence of left artificial knee joint: Secondary | ICD-10-CM | POA: Diagnosis not present

## 2016-07-15 DIAGNOSIS — M25662 Stiffness of left knee, not elsewhere classified: Secondary | ICD-10-CM | POA: Diagnosis not present

## 2016-07-25 DIAGNOSIS — M25562 Pain in left knee: Secondary | ICD-10-CM | POA: Diagnosis not present

## 2016-08-10 DIAGNOSIS — Z96652 Presence of left artificial knee joint: Secondary | ICD-10-CM | POA: Diagnosis not present

## 2016-08-25 DIAGNOSIS — I2510 Atherosclerotic heart disease of native coronary artery without angina pectoris: Secondary | ICD-10-CM | POA: Diagnosis not present

## 2016-08-25 DIAGNOSIS — I6523 Occlusion and stenosis of bilateral carotid arteries: Secondary | ICD-10-CM | POA: Diagnosis not present

## 2016-08-25 DIAGNOSIS — F17200 Nicotine dependence, unspecified, uncomplicated: Secondary | ICD-10-CM | POA: Diagnosis not present

## 2016-08-25 DIAGNOSIS — G4733 Obstructive sleep apnea (adult) (pediatric): Secondary | ICD-10-CM | POA: Diagnosis not present

## 2016-09-03 DIAGNOSIS — I10 Essential (primary) hypertension: Secondary | ICD-10-CM | POA: Diagnosis not present

## 2016-09-06 DIAGNOSIS — Z471 Aftercare following joint replacement surgery: Secondary | ICD-10-CM | POA: Diagnosis not present

## 2016-09-09 DIAGNOSIS — Z0000 Encounter for general adult medical examination without abnormal findings: Secondary | ICD-10-CM | POA: Diagnosis not present

## 2016-09-09 DIAGNOSIS — G4733 Obstructive sleep apnea (adult) (pediatric): Secondary | ICD-10-CM | POA: Diagnosis not present

## 2016-09-09 DIAGNOSIS — I10 Essential (primary) hypertension: Secondary | ICD-10-CM | POA: Diagnosis not present

## 2016-09-09 DIAGNOSIS — F17210 Nicotine dependence, cigarettes, uncomplicated: Secondary | ICD-10-CM | POA: Diagnosis not present

## 2016-09-09 DIAGNOSIS — I2510 Atherosclerotic heart disease of native coronary artery without angina pectoris: Secondary | ICD-10-CM | POA: Diagnosis not present

## 2016-10-18 ENCOUNTER — Encounter (INDEPENDENT_AMBULATORY_CARE_PROVIDER_SITE_OTHER): Payer: Self-pay

## 2016-10-18 ENCOUNTER — Encounter (INDEPENDENT_AMBULATORY_CARE_PROVIDER_SITE_OTHER): Payer: Self-pay | Admitting: Vascular Surgery

## 2016-10-18 ENCOUNTER — Ambulatory Visit (INDEPENDENT_AMBULATORY_CARE_PROVIDER_SITE_OTHER): Payer: PPO | Admitting: Vascular Surgery

## 2016-10-18 DIAGNOSIS — M79604 Pain in right leg: Secondary | ICD-10-CM | POA: Diagnosis not present

## 2016-10-18 DIAGNOSIS — I70213 Atherosclerosis of native arteries of extremities with intermittent claudication, bilateral legs: Secondary | ICD-10-CM | POA: Diagnosis not present

## 2016-10-18 DIAGNOSIS — E782 Mixed hyperlipidemia: Secondary | ICD-10-CM

## 2016-10-18 DIAGNOSIS — M150 Primary generalized (osteo)arthritis: Secondary | ICD-10-CM

## 2016-10-18 DIAGNOSIS — M159 Polyosteoarthritis, unspecified: Secondary | ICD-10-CM

## 2016-10-18 DIAGNOSIS — I6523 Occlusion and stenosis of bilateral carotid arteries: Secondary | ICD-10-CM

## 2016-10-18 DIAGNOSIS — M79605 Pain in left leg: Secondary | ICD-10-CM | POA: Diagnosis not present

## 2016-10-18 DIAGNOSIS — I10 Essential (primary) hypertension: Secondary | ICD-10-CM | POA: Diagnosis not present

## 2016-10-19 DIAGNOSIS — I10 Essential (primary) hypertension: Secondary | ICD-10-CM | POA: Insufficient documentation

## 2016-10-19 DIAGNOSIS — I6529 Occlusion and stenosis of unspecified carotid artery: Secondary | ICD-10-CM | POA: Insufficient documentation

## 2016-10-19 DIAGNOSIS — E785 Hyperlipidemia, unspecified: Secondary | ICD-10-CM | POA: Insufficient documentation

## 2016-10-19 DIAGNOSIS — I70219 Atherosclerosis of native arteries of extremities with intermittent claudication, unspecified extremity: Secondary | ICD-10-CM | POA: Insufficient documentation

## 2016-10-19 DIAGNOSIS — M79606 Pain in leg, unspecified: Secondary | ICD-10-CM | POA: Insufficient documentation

## 2016-10-19 DIAGNOSIS — M1990 Unspecified osteoarthritis, unspecified site: Secondary | ICD-10-CM | POA: Insufficient documentation

## 2016-10-19 NOTE — Progress Notes (Signed)
MRN : [id number]  Ivan Reyes is a 71 y.o. (18-Nov-1945) male who presents with chief complaint of  Chief Complaint  Patient presents with  . Re-evaluation    Consult on legs to rule out clots  .  History of Present Illness: The patient returns to the office for followup regarding his leg symptoms. He feels there has been a significant deterioration in the lower extremity symptoms.  The patient notes interval shortening of their claudication distance and development of mild rest pain symptoms. No new ulcers or wounds have occurred since the last visit.  There have been no significant changes to the patient's overall health care.  He is also followed for his carotid stenosis.  The patient denies amaurosis fugax or recent TIA symptoms. There are no recent neurological changes noted.  The patient denies history of DVT, PE or superficial thrombophlebitis. The patient denies recent episodes of angina or shortness of breath.    Current Meds  Medication Sig  . Cholecalciferol (VITAMIN D3) 3000 units TABS Take 2,000 Units by mouth daily.  . clopidogrel (PLAVIX) 75 MG tablet Take 75 mg by mouth daily.  . enalapril (VASOTEC) 20 MG tablet Take 20 mg by mouth 2 (two) times daily.  Marland Kitchen enoxaparin (LOVENOX) 40 MG/0.4ML injection Inject 0.4 mLs (40 mg total) into the skin daily.  . fenofibrate micronized (LOFIBRA) 134 MG capsule Take 134 mg by mouth at bedtime.  . Glucosamine-Chondroit-Vit C-Mn (GLUCOSAMINE 1500 COMPLEX) CAPS Take by mouth.  . hydrochlorothiazide (HYDRODIURIL) 12.5 MG tablet Take 12.5 mg by mouth daily.  . Investigational omega-3-fatty acid/placebo capsule [phone] Take by mouth.  . Misc Natural Products (GLUCOSAMINE CHONDROITIN ADV) TABS Take 1 tablet by mouth 2 (two) times daily.  . Omega-3 Fatty Acids (FISH OIL) 1000 MG CAPS Take 1 capsule by mouth 2 (two) times daily.  Marland Kitchen oxyCODONE (OXY IR/ROXICODONE) 5 MG immediate release tablet Take 1-2 tablets (5-10 mg total) by mouth  every 4 (four) hours as needed for severe pain or breakthrough pain.  . sildenafil (REVATIO) 20 MG tablet 3-5 tabs po daily prn  . simvastatin (ZOCOR) 40 MG tablet Take 40 mg by mouth daily.  . traMADol (ULTRAM) 50 MG tablet Take 1-2 tablets (50-100 mg total) by mouth every 4 (four) hours as needed for moderate pain.  . vitamin B-12 (CYANOCOBALAMIN) 500 MCG tablet Take 500 mcg by mouth daily.  . vitamin E 400 UNIT capsule Take 400 Units by mouth daily.    Past Medical History:  Diagnosis Date  . Arthritis   . Coronary artery disease   . Hyperlipidemia   . Hypertension   . Peripheral vascular disease ([place])   . Sleep apnea    use C-PAP    Past Surgical History:  Procedure Laterality Date  . CAROTID ENDARTERECTOMY Bilateral    Dr. Delana Meyer, [facility]  . CORONARY ANGIOPLASTY    . EYE SURGERY Bilateral    Cataract Extraction with IOL  . FRACTURE SURGERY Right    Hip Pinning, Dr. Franchot Mimes, Jr  . KNEE ARTHROPLASTY Left 06/30/2016   Procedure: COMPUTER ASSISTED TOTAL KNEE ARTHROPLASTY;  Surgeon: Dereck Leep, MD;  Location: [facility] ORS;  Service: Orthopedics;  Laterality: Left;    Social History Social History  Substance Use Topics  . Smoking status: Current Every Day Smoker    Packs/day: 1.00    Types: Cigarettes  . Smokeless tobacco: Never Used  . Alcohol use 1.2 oz/week    2 Cans of beer per week  Comment: daily    Family History No family history on file. No family history of bleeding/clotting disorders, porphyria or autoimmune disease   No Known Allergies   REVIEW OF SYSTEMS (Negative unless checked)  Constitutional: [] Weight loss  [] Fever  [] Chills Cardiac: [] Chest pain   [] Chest pressure   [] Palpitations   [] Shortness of breath when laying flat   [] Shortness of breath with exertion. Vascular:  [x] Pain in legs with walking   [x] Pain in legs at rest  [] History of DVT   [] Phlebitis   [] Swelling in legs   [] Varicose veins   [] Non-healing ulcers Pulmonary:   [] Uses  home oxygen   [] Productive cough   [] Hemoptysis   [] Wheeze  [] COPD   [] Asthma Neurologic:  [] Dizziness   [] Seizures   [] History of stroke   [] History of TIA  [] Aphasia   [] Vissual changes   [] Weakness or numbness in arm   [] Weakness or numbness in leg Musculoskeletal:   [] Joint swelling   [] Joint pain   [] Low back pain Hematologic:  [] Easy bruising  [] Easy bleeding   [] Hypercoagulable state   [] Anemic Gastrointestinal:  [] Diarrhea   [] Vomiting  [] Gastroesophageal reflux/heartburn   [] Difficulty swallowing. Genitourinary:  [] Chronic kidney disease   [] Difficult urination  [] Frequent urination   [] Blood in urine Skin:  [] Rashes   [] Ulcers  Psychological:  [] History of anxiety   []  History of major depression.  Physical Examination  Vitals:   10/18/16 1010  BP: 140/80  Pulse: 62  Resp: 16  Weight: 83.9 kg (185 lb)  Height: 5\' 10"  (1.778 m)   Body mass index is 26.54 kg/m. Gen: WD/WN, NAD Head: [place]/AT, No temporalis wasting.  Ear/Nose/Throat: Hearing grossly intact, nares w/o erythema or drainage, poor dentition Eyes: PER, EOMI, sclera nonicteric.  Neck: Supple, no masses.  No bruit or JVD.  Pulmonary:  Good air movement, clear to auscultation bilaterally, no use of accessory muscles.  Cardiac: RRR, normal S1, S2, no Murmurs. Vascular:  Legs cool to touch with sluggish cap refill 2+ edema soft pitting Vessel Right Left  Radial Palpable Palpable  Ulnar Palpable Palpable  Brachial Palpable Palpable  Carotid Palpable Palpable  Femoral Palpable Palpable  Popliteal Not Palpable Not Palpable  PT Not Palpable Not Palpable  DP Not Palpable Not Palpable   Gastrointestinal: soft, non-distended. No guarding/no peritoneal signs.  Musculoskeletal: M/S 5/5 throughout.  No deformity or atrophy.  Neurologic: CN 2-12 intact. Pain and light touch intact in extremities.  Symmetrical.  Speech is fluent. Motor exam as listed above. Psychiatric: Judgment intact, Mood & affect appropriate for pt's  clinical situation. Dermatologic: mild venous rashes no ulcers noted.  No changes consistent with cellulitis. Lymph : No Cervical lymphadenopathy, no lichenification or skin changes of chronic lymphedema.  CBC Lab Results  Component Value Date   WBC 8.7 07/02/2016   HGB 12.6 (L) 07/02/2016   HCT 35.9 (L) 07/02/2016   MCV 87.8 07/02/2016   PLT 151 07/02/2016    BMET    Component Value Date/Time   NA 135 07/02/2016 0426   K 3.9 07/02/2016 0426   K 4.1 06/28/2014 1603   CL 103 07/02/2016 0426   CO2 27 07/02/2016 0426   GLUCOSE 95 07/02/2016 0426   BUN 13 07/02/2016 0426   CREATININE 0.82 07/02/2016 0426   CALCIUM 8.7 (L) 07/02/2016 0426   GFRNONAA >60 07/02/2016 0426   GFRAA >60 07/02/2016 0426   CrCl cannot be calculated (Patient's most recent lab result is older than the maximum 21 days allowed.).  COAG Lab Results  Component Value Date   INR 0.92 06/16/2016    Radiology No results found.  Assessment/Plan 1. Pain in both lower extremities Recommend:  Patient should undergo arterial duplex of the lower extremity  because there has been a deterioration in the patient's lower extremity symptoms.  The patient states they are having increased pain and a marked decrease in the distance that they can walk.  The risks and benefits as well as the alternatives were discussed in detail with the patient.  All questions were answered.  Patient agrees to proceed and understands this could be a prelude to angiography and intervention.  The patient will follow up with me in the office to review the studies.   - VAS [place] ABI WITH/WO TBI; Future  2. Atherosclerosis of native artery of both lower extremities with intermittent claudication ([place]) See #1  3. Bilateral carotid artery stenosis Recommend:  Given the patient's asymptomatic subcritical stenosis no further invasive testing or surgery at this time.  Continue antiplatelet therapy as prescribed Continue management of CAD,  HTN and Hyperlipidemia Healthy heart diet,  encouraged exercise at least 4 times per week Follow up with duplex ultrasound   - VAS US CAROTID; Future  4. Primary osteoarthritis involving multiple joints Continue NSAID medications as already ordered, these medications have been reviewed and there are no changes at this time.  5. Essential hypertension Continue antihypertensive medications as already ordered, these medications have been reviewed and there are no changes at this time.  6. Mixed hyperlipidemia Continue statin as ordered and reviewed, no changes at this time   Hortencia Pilar, MD  10/19/2016 8:32 AM

## 2016-10-20 DIAGNOSIS — M79642 Pain in left hand: Secondary | ICD-10-CM | POA: Diagnosis not present

## 2016-10-20 DIAGNOSIS — M151 Heberden's nodes (with arthropathy): Secondary | ICD-10-CM | POA: Diagnosis not present

## 2016-11-10 DIAGNOSIS — M720 Palmar fascial fibromatosis [Dupuytren]: Secondary | ICD-10-CM | POA: Diagnosis not present

## 2016-11-10 DIAGNOSIS — G5602 Carpal tunnel syndrome, left upper limb: Secondary | ICD-10-CM | POA: Diagnosis not present

## 2016-12-10 DIAGNOSIS — F17200 Nicotine dependence, unspecified, uncomplicated: Secondary | ICD-10-CM | POA: Diagnosis not present

## 2016-12-10 DIAGNOSIS — I10 Essential (primary) hypertension: Secondary | ICD-10-CM | POA: Diagnosis not present

## 2016-12-10 DIAGNOSIS — J449 Chronic obstructive pulmonary disease, unspecified: Secondary | ICD-10-CM | POA: Diagnosis not present

## 2016-12-10 DIAGNOSIS — M79641 Pain in right hand: Secondary | ICD-10-CM | POA: Diagnosis not present

## 2016-12-10 DIAGNOSIS — M79642 Pain in left hand: Secondary | ICD-10-CM | POA: Diagnosis not present

## 2016-12-13 DIAGNOSIS — G5603 Carpal tunnel syndrome, bilateral upper limbs: Secondary | ICD-10-CM | POA: Diagnosis not present

## 2016-12-14 ENCOUNTER — Ambulatory Visit (INDEPENDENT_AMBULATORY_CARE_PROVIDER_SITE_OTHER): Payer: PPO

## 2016-12-14 ENCOUNTER — Encounter (INDEPENDENT_AMBULATORY_CARE_PROVIDER_SITE_OTHER): Payer: Self-pay | Admitting: Vascular Surgery

## 2016-12-14 ENCOUNTER — Ambulatory Visit (INDEPENDENT_AMBULATORY_CARE_PROVIDER_SITE_OTHER): Payer: PPO | Admitting: Vascular Surgery

## 2016-12-14 VITALS — BP 176/72 | HR 72 | Resp 16 | Ht 70.0 in | Wt 187.0 lb

## 2016-12-14 DIAGNOSIS — E782 Mixed hyperlipidemia: Secondary | ICD-10-CM | POA: Diagnosis not present

## 2016-12-14 DIAGNOSIS — I70213 Atherosclerosis of native arteries of extremities with intermittent claudication, bilateral legs: Secondary | ICD-10-CM

## 2016-12-14 DIAGNOSIS — M79605 Pain in left leg: Secondary | ICD-10-CM | POA: Diagnosis not present

## 2016-12-14 DIAGNOSIS — M79604 Pain in right leg: Secondary | ICD-10-CM | POA: Diagnosis not present

## 2016-12-14 DIAGNOSIS — I6523 Occlusion and stenosis of bilateral carotid arteries: Secondary | ICD-10-CM

## 2016-12-14 NOTE — Progress Notes (Signed)
Subjective:    Patient ID: Ivan Reyes, male    DOB: Dec 19, 1945, 71 y.o.   MRN: [id number] Chief Complaint  Patient presents with  . Carotid    1 year follow up   Patient presents to review vascular studies. He was last seen on 10/18/16. He presents at the encouragement of Dr. Nehemiah Massed. Patient has history of carotid artery disease s/p bilateral endarterectomies. He is interested in being tested for PAD. The patient denies any claudication like symptoms, rest pain or ulcers to his feet / toes. Patient is s/p left TKR.  Patient presents for a two year non-invasive study follow up for carotid stenosis. The stenosis has been followed by surveillance duplexes. The patient underwent a bilateral carotid duplex scan which showed no change from the previous exam on 01/27/15. Duplex is stable patent bilateral carotid endarterectomies. The patient denies experiencing Amaurosis Fugax, TIA like symptoms or focal motor deficits. The patient underwent an ABI which showed no evidence.    Review of Systems  Constitutional: Negative.   HENT: Negative.   Eyes: Negative.   Respiratory: Negative.   Cardiovascular: Negative.   Gastrointestinal: Negative.   Endocrine: Negative.   Genitourinary: Negative.   Musculoskeletal: Negative.   Skin: Negative.   Allergic/Immunologic: Negative.   Neurological: Negative.   Hematological: Negative.   Psychiatric/Behavioral: Negative.       Objective:   Physical Exam  Constitutional: He is oriented to person, place, and time. He appears well-developed and well-nourished. No distress.  HENT:  Head: Normocephalic and atraumatic.  Right Ear: External ear normal.  Left Ear: External ear normal.  Eyes: Conjunctivae are normal. Pupils are equal, round, and reactive to light.  Neck: Normal range of motion.  Cardiovascular: Normal rate, regular rhythm and normal heart sounds.   Pulses:      Radial pulses are 2+ on the right side, and 2+ on the left side.   Dorsalis pedis pulses are 2+ on the right side, and 2+ on the left side.       Posterior tibial pulses are 2+ on the right side, and 2+ on the left side.  Pulmonary/Chest: Effort normal.  Musculoskeletal: Normal range of motion. He exhibits no edema.  Neurological: He is alert and oriented to person, place, and time.  Skin: Skin is warm and dry. He is not diaphoretic.  Psychiatric: He has a normal mood and affect. His behavior is normal. Judgment and thought content normal.  Vitals reviewed.  BP (!) 176/72 (BP Location: Left Arm)   Pulse 72   Resp 16   Ht 5\' 10"  (1.778 m)   Wt 187 lb (84.8 kg)   BMI 26.83 kg/m   Past Medical History:  Diagnosis Date  . Arthritis   . Coronary artery disease   . Hyperlipidemia   . Hypertension   . Peripheral vascular disease ([place])   . Sleep apnea    use C-PAP   Social History   Social History  . Marital status: Widowed    Spouse name: N/A  . Number of children: N/A  . Years of education: N/A   Occupational History  . Not on file.   Social History Main Topics  . Smoking status: Current Every Day Smoker    Packs/day: 1.00    Types: Cigarettes  . Smokeless tobacco: Never Used  . Alcohol use 1.2 oz/week    2 Cans of beer per week     Comment: daily  . Drug use: No  . Sexual activity:  Not on file   Other Topics Concern  . Not on file   Social History Narrative  . No narrative on file   Past Surgical History:  Procedure Laterality Date  . CAROTID ENDARTERECTOMY Bilateral    Dr. Delana Meyer, [facility]  . CORONARY ANGIOPLASTY    . EYE SURGERY Bilateral    Cataract Extraction with IOL  . FRACTURE SURGERY Right    Hip Pinning, Dr. Franchot Mimes, Jr  . KNEE ARTHROPLASTY Left 06/30/2016   Procedure: COMPUTER ASSISTED TOTAL KNEE ARTHROPLASTY;  Surgeon: Dereck Leep, MD;  Location: [facility] ORS;  Service: Orthopedics;  Laterality: Left;   No family history on file.  No Known Allergies     Assessment & Plan:  Patient presents to review  vascular studies. He was last seen on 10/18/16. He presents at the encouragement of Dr. Nehemiah Massed. Patient has history of carotid artery disease s/p bilateral endarterectomies. He is interested in being tested for PAD. The patient denies any claudication like symptoms, rest pain or ulcers to his feet / toes. Patient is s/p left TKR.  Patient presents for a two year non-invasive study follow up for carotid stenosis. The stenosis has been followed by surveillance duplexes. The patient underwent a bilateral carotid duplex scan which showed no change from the previous exam on 01/27/15. Duplex is stable patent bilateral carotid endarterectomies. The patient denies experiencing Amaurosis Fugax, TIA like symptoms or focal motor deficits. The patient underwent an ABI which showed no evidence.   1. Atherosclerosis of native artery of both lower extremities with intermittent claudication ([place]) - Stable Patient with no evidence of atherosclerotic disease on ABI. Asymptomatic. Would be happy to follow patient and order ABI in two years along with carotid. Patient to call if he would like to be seen sooner  - VAS [place] ABI WITH/WO TBI; Future  2. Bilateral carotid artery stenosis - Stable Studies reviewed with patient. Patient asymptomatic with stable duplex. No intervention at this time. Patient to return in two years for surveillance carotid duplex. I have discussed with the patient at length the risk factors for and pathogenesis of atherosclerotic disease and encouraged a healthy diet, regular exercise regimen and blood pressure / glucose control.  Patient was instructed to contact our office in the interim with problems such as arm / leg weakness or numbness, speech / swallowing difficulty or temporary monocular blindness. The patient expresses their understanding.  - VAS US CAROTID; Future  3. Mixed hyperlipidemia - Stable Encouraged good control as its slows the progression of atherosclerotic  disease  Current Outpatient Prescriptions on File Prior to Visit  Medication Sig Dispense Refill  . Cholecalciferol (VITAMIN D3) 3000 units TABS Take 2,000 Units by mouth daily.    . clopidogrel (PLAVIX) 75 MG tablet Take 75 mg by mouth daily.    . enalapril (VASOTEC) 20 MG tablet Take 20 mg by mouth 2 (two) times daily.    Marland Kitchen enoxaparin (LOVENOX) 40 MG/0.4ML injection Inject 0.4 mLs (40 mg total) into the skin daily. 14 Syringe 0  . fenofibrate micronized (LOFIBRA) 134 MG capsule Take 134 mg by mouth at bedtime.    . Glucosamine-Chondroit-Vit C-Mn (GLUCOSAMINE 1500 COMPLEX) CAPS Take by mouth.    . hydrochlorothiazide (HYDRODIURIL) 12.5 MG tablet Take 12.5 mg by mouth daily.    . Investigational omega-3-fatty acid/placebo capsule [phone] Take by mouth.    . Misc Natural Products (GLUCOSAMINE CHONDROITIN ADV) TABS Take 1 tablet by mouth 2 (two) times daily.    . Omega-3  Fatty Acids (FISH OIL) 1000 MG CAPS Take 1 capsule by mouth 2 (two) times daily.    Marland Kitchen oxyCODONE (OXY IR/ROXICODONE) 5 MG immediate release tablet Take 1-2 tablets (5-10 mg total) by mouth every 4 (four) hours as needed for severe pain or breakthrough pain. 30 tablet 0  . sildenafil (REVATIO) 20 MG tablet 3-5 tabs po daily prn    . simvastatin (ZOCOR) 40 MG tablet Take 40 mg by mouth daily.    . traMADol (ULTRAM) 50 MG tablet Take 1-2 tablets (50-100 mg total) by mouth every 4 (four) hours as needed for moderate pain. 60 tablet 0  . vitamin B-12 (CYANOCOBALAMIN) 500 MCG tablet Take 500 mcg by mouth daily.    . vitamin E 400 UNIT capsule Take 400 Units by mouth daily.     No current facility-administered medications on file prior to visit.    There are no Patient Instructions on file for this visit. Return in about 2 years (around 12/15/2018) for Carotid and ABI.  Tameron Lama A Donalee Gaumond, PA-C

## 2016-12-20 DIAGNOSIS — G5603 Carpal tunnel syndrome, bilateral upper limbs: Secondary | ICD-10-CM | POA: Diagnosis not present

## 2016-12-22 ENCOUNTER — Encounter
Admission: RE | Admit: 2016-12-22 | Discharge: 2016-12-22 | Disposition: A | Discharge status: Home / Self Care | Payer: PPO | Source: Ambulatory Visit | Attending: Surgery | Admitting: Surgery

## 2016-12-22 DIAGNOSIS — I2510 Atherosclerotic heart disease of native coronary artery without angina pectoris: Secondary | ICD-10-CM | POA: Diagnosis not present

## 2016-12-22 DIAGNOSIS — I059 Rheumatic mitral valve disease, unspecified: Secondary | ICD-10-CM | POA: Diagnosis not present

## 2016-12-22 DIAGNOSIS — I10 Essential (primary) hypertension: Secondary | ICD-10-CM | POA: Diagnosis not present

## 2016-12-22 DIAGNOSIS — F17210 Nicotine dependence, cigarettes, uncomplicated: Secondary | ICD-10-CM | POA: Diagnosis not present

## 2016-12-22 DIAGNOSIS — E785 Hyperlipidemia, unspecified: Secondary | ICD-10-CM | POA: Diagnosis not present

## 2016-12-22 DIAGNOSIS — G5603 Carpal tunnel syndrome, bilateral upper limbs: Secondary | ICD-10-CM | POA: Diagnosis not present

## 2016-12-22 DIAGNOSIS — I739 Peripheral vascular disease, unspecified: Secondary | ICD-10-CM | POA: Diagnosis not present

## 2016-12-22 DIAGNOSIS — Z7902 Long term (current) use of antithrombotics/antiplatelets: Secondary | ICD-10-CM | POA: Diagnosis not present

## 2016-12-22 DIAGNOSIS — G5602 Carpal tunnel syndrome, left upper limb: Secondary | ICD-10-CM | POA: Diagnosis not present

## 2016-12-22 DIAGNOSIS — Z96652 Presence of left artificial knee joint: Secondary | ICD-10-CM | POA: Diagnosis not present

## 2016-12-22 DIAGNOSIS — G4730 Sleep apnea, unspecified: Secondary | ICD-10-CM | POA: Diagnosis not present

## 2016-12-22 DIAGNOSIS — Z79899 Other long term (current) drug therapy: Secondary | ICD-10-CM | POA: Diagnosis not present

## 2016-12-22 DIAGNOSIS — M1990 Unspecified osteoarthritis, unspecified site: Secondary | ICD-10-CM | POA: Diagnosis not present

## 2016-12-22 LAB — CBC
HCT: 40.3 % (ref 40.0–52.0)
Hemoglobin: 13.4 g/dL (ref 13.0–18.0)
MCH: 30.4 pg (ref 26.0–34.0)
MCHC: 33.2 g/dL (ref 32.0–36.0)
MCV: 91.5 fL (ref 80.0–100.0)
Platelets: 245 10*3/uL (ref 150–440)
RBC: 4.4 MIL/uL (ref 4.40–5.90)
RDW: 15.2 % — ABNORMAL HIGH (ref 11.5–14.5)
WBC: 11.1 10*3/uL — ABNORMAL HIGH (ref 3.8–10.6)

## 2016-12-22 LAB — DIFFERENTIAL
BASOS ABS: 0.2 10*3/uL — AB (ref 0–0.1)
Basophils Relative: 2 %
EOS ABS: 0.3 10*3/uL (ref 0–0.7)
Eosinophils Relative: 2 %
LYMPHS ABS: 1.5 10*3/uL (ref 1.0–3.6)
Lymphocytes Relative: 14 %
MONOS PCT: 7 %
Monocytes Absolute: 0.8 10*3/uL (ref 0.2–1.0)
NEUTROS ABS: 8.3 10*3/uL — AB (ref 1.4–6.5)
NEUTROS PCT: 75 %

## 2016-12-22 LAB — POTASSIUM: Potassium: 4.1 mmol/L (ref 3.5–5.1)

## 2016-12-22 NOTE — Patient Instructions (Signed)
  Your procedure is scheduled on: Dec 23, 2016 (Thursday)  Report to Same Day Surgery 2nd floor medical mall [facility] Entrance-take elevator on left to 2nd floor.  Check in with surgery information desk.) ARRIVAL TIME 11:30 AM    Remember: Instructions that are not followed completely may result in serious medical risk, up to and including death, or upon the discretion of your surgeon and anesthesiologist your surgery may need to be rescheduled.    _x___ 1. Do not eat food or drink liquids after midnight. No gum chewing or  hard candies                            __x__ 2. No Alcohol for 24 hours before or after surgery.   __x__3. No Smoking for 24 prior to surgery.   ____  4. Bring all medications with you on the day of surgery if instructed.    __x__ 5. Notify your doctor if there is any change in your medical condition     (cold, fever, infections).     Do not wear jewelry, make-up, hairpins, clips or nail polish.  Do not wear lotions, powders, or perfumes.   Do not shave 48 hours prior to surgery. Men may shave face and neck.  Do not bring valuables to the hospital.    [facility] is not responsible for any belongings or valuables.               Contacts, dentures or bridgework may not be worn into surgery.  Leave your suitcase in the car. After surgery it may be brought to your room.  For patients admitted to the hospital, discharge time is determined by your treatment team                      Patients discharged the day of surgery will not be allowed to drive home.  You will need someone to drive you home and stay with you the night of your procedure.    Please read over the following fact sheets that you were given:   [facility] Preparing for Surgery and or MRSA Information   _x___ Take anti-hypertensive (unless it includes a diuretic), cardiac, seizure, asthma,     anti-reflux and psychiatric medicines with a sip of water. These include:  1. ENALAPRIL  2.  GABAPENTIN  3. SIMVASTATIN  4.  5.  6.  ____Fleets enema or Magnesium Citrate as directed.   _x___ Use CHG Soap or sage wipes as directed on instruction sheet   ____ Use inhalers on the day of surgery and bring to hospital day of surgery  ____ Stop Metformin and Janumet 2 days prior to surgery.    ____ Take 1/2 of usual insulin dose the night before surgery and none on the morning surgery     _x___ Follow recommendations from Cardiologist, Pulmonologist or PCP regarding          stopping Aspirin, Coumadin, Pllavix ,Eliquis, Effient, or Pradaxa, and Pletal.  (PATIENT STATED STOPPED PLAVIX ON MAY 7 )  X____Stop Anti-inflammatories such as Advil, Aleve, Ibuprofen, Motrin, Naproxen, Naprosyn, Goodies powders or aspirin products. OK to take Tylenol    _x___ Stop supplements until after surgery.  But may continue Vitamin D, Vitamin B, and multivitamin. (STOP GLUCOSAMINE, FISH OIL, VITAMIN E  NOW   )   __x_ Bring C-Pap to the hospital.

## 2016-12-23 ENCOUNTER — Ambulatory Visit: Payer: PPO | Admitting: Anesthesiology

## 2016-12-23 ENCOUNTER — Ambulatory Visit
Admission: RE | Admit: 2016-12-23 | Discharge: 2016-12-23 | Disposition: A | Discharge status: Home / Self Care | Payer: PPO | Source: Ambulatory Visit | Attending: Surgery | Admitting: Surgery

## 2016-12-23 ENCOUNTER — Encounter: Payer: Self-pay | Admitting: *Deleted

## 2016-12-23 ENCOUNTER — Encounter
Admission: RE | Disposition: A | Discharge status: Home / Self Care | Payer: Self-pay | Source: Ambulatory Visit | Attending: Surgery

## 2016-12-23 DIAGNOSIS — I10 Essential (primary) hypertension: Secondary | ICD-10-CM | POA: Insufficient documentation

## 2016-12-23 DIAGNOSIS — I739 Peripheral vascular disease, unspecified: Secondary | ICD-10-CM | POA: Insufficient documentation

## 2016-12-23 DIAGNOSIS — G5603 Carpal tunnel syndrome, bilateral upper limbs: Secondary | ICD-10-CM | POA: Insufficient documentation

## 2016-12-23 DIAGNOSIS — F17210 Nicotine dependence, cigarettes, uncomplicated: Secondary | ICD-10-CM | POA: Insufficient documentation

## 2016-12-23 DIAGNOSIS — E785 Hyperlipidemia, unspecified: Secondary | ICD-10-CM | POA: Insufficient documentation

## 2016-12-23 DIAGNOSIS — M1990 Unspecified osteoarthritis, unspecified site: Secondary | ICD-10-CM | POA: Insufficient documentation

## 2016-12-23 DIAGNOSIS — Z96652 Presence of left artificial knee joint: Secondary | ICD-10-CM | POA: Insufficient documentation

## 2016-12-23 DIAGNOSIS — I059 Rheumatic mitral valve disease, unspecified: Secondary | ICD-10-CM | POA: Insufficient documentation

## 2016-12-23 DIAGNOSIS — G5602 Carpal tunnel syndrome, left upper limb: Secondary | ICD-10-CM | POA: Diagnosis not present

## 2016-12-23 DIAGNOSIS — I25119 Atherosclerotic heart disease of native coronary artery with unspecified angina pectoris: Secondary | ICD-10-CM | POA: Diagnosis not present

## 2016-12-23 DIAGNOSIS — Z7902 Long term (current) use of antithrombotics/antiplatelets: Secondary | ICD-10-CM | POA: Insufficient documentation

## 2016-12-23 DIAGNOSIS — I2510 Atherosclerotic heart disease of native coronary artery without angina pectoris: Secondary | ICD-10-CM | POA: Insufficient documentation

## 2016-12-23 DIAGNOSIS — Z79899 Other long term (current) drug therapy: Secondary | ICD-10-CM | POA: Insufficient documentation

## 2016-12-23 DIAGNOSIS — G4730 Sleep apnea, unspecified: Secondary | ICD-10-CM | POA: Insufficient documentation

## 2016-12-23 HISTORY — PX: CARPAL TUNNEL RELEASE: SHX101

## 2016-12-23 SURGERY — RELEASE, CARPAL TUNNEL, ENDOSCOPIC
Anesthesia: Regional | Site: Hand | Laterality: Left | Wound class: Clean

## 2016-12-23 MED ORDER — BUPIVACAINE-EPINEPHRINE (PF) 0.5% -1:200000 IJ SOLN
INTRAMUSCULAR | Status: DC | PRN
  Administered 2016-12-23: 10 mL via PERINEURAL

## 2016-12-23 MED ORDER — MIDAZOLAM HCL 5 MG/5ML IJ SOLN
INTRAMUSCULAR | Status: DC | PRN
  Administered 2016-12-23: 1 mg via INTRAVENOUS

## 2016-12-23 MED ORDER — FENTANYL CITRATE (PF) 100 MCG/2ML IJ SOLN: 25.0000 ug | INTRAMUSCULAR | Status: DC | PRN

## 2016-12-23 MED ORDER — METOCLOPRAMIDE HCL 10 MG PO TABS: 5.0000 mg | ORAL_TABLET | Freq: Three times a day (TID) | ORAL | Status: DC | PRN

## 2016-12-23 MED ORDER — FENTANYL CITRATE (PF) 100 MCG/2ML IJ SOLN
INTRAMUSCULAR | Status: AC
  Filled 2016-12-23: qty 2

## 2016-12-23 MED ORDER — CEFAZOLIN SODIUM-DEXTROSE 2-4 GM/100ML-% IV SOLN: 2.0000 g | Freq: Once | INTRAVENOUS | Status: DC

## 2016-12-23 MED ORDER — LIDOCAINE HCL (PF) 0.5 % IJ SOLN
INTRAMUSCULAR | Status: AC
  Filled 2016-12-23: qty 50

## 2016-12-23 MED ORDER — OXYCODONE HCL 5 MG PO TABS: 5.0000 mg | ORAL_TABLET | ORAL | 0 refills | Status: DC | PRN

## 2016-12-23 MED ORDER — FAMOTIDINE 20 MG PO TABS
20.0000 mg | ORAL_TABLET | Freq: Once | ORAL | Status: AC
  Administered 2016-12-23: 20 mg via ORAL

## 2016-12-23 MED ORDER — METOCLOPRAMIDE HCL 5 MG/ML IJ SOLN: 5.0000 mg | Freq: Three times a day (TID) | INTRAMUSCULAR | Status: DC | PRN

## 2016-12-23 MED ORDER — FENTANYL CITRATE (PF) 100 MCG/2ML IJ SOLN
INTRAMUSCULAR | Status: DC | PRN
  Administered 2016-12-23: 50 ug via INTRAVENOUS

## 2016-12-23 MED ORDER — CEFAZOLIN SODIUM-DEXTROSE 2-4 GM/100ML-% IV SOLN
INTRAVENOUS | Status: AC
  Filled 2016-12-23: qty 100

## 2016-12-23 MED ORDER — BUPIVACAINE-EPINEPHRINE (PF) 0.5% -1:200000 IJ SOLN
INTRAMUSCULAR | Status: AC
  Filled 2016-12-23: qty 30

## 2016-12-23 MED ORDER — ONDANSETRON HCL 4 MG/2ML IJ SOLN: 4.0000 mg | Freq: Once | INTRAMUSCULAR | Status: DC | PRN

## 2016-12-23 MED ORDER — PROPOFOL 10 MG/ML IV BOLUS
INTRAVENOUS | Status: AC
  Filled 2016-12-23: qty 20

## 2016-12-23 MED ORDER — ONDANSETRON HCL 4 MG PO TABS: 4.0000 mg | ORAL_TABLET | Freq: Four times a day (QID) | ORAL | Status: DC | PRN

## 2016-12-23 MED ORDER — ONDANSETRON HCL 4 MG/2ML IJ SOLN: 4.0000 mg | Freq: Four times a day (QID) | INTRAMUSCULAR | Status: DC | PRN

## 2016-12-23 MED ORDER — LACTATED RINGERS IV SOLN
INTRAVENOUS | Status: DC
  Administered 2016-12-23 (×2): via INTRAVENOUS

## 2016-12-23 MED ORDER — FAMOTIDINE 20 MG PO TABS
ORAL_TABLET | ORAL | Status: AC
  Administered 2016-12-23: 20 mg via ORAL
  Filled 2016-12-23: qty 1

## 2016-12-23 MED ORDER — POTASSIUM CHLORIDE IN NACL 20-0.9 MEQ/L-% IV SOLN: INTRAVENOUS | Status: DC

## 2016-12-23 MED ORDER — SODIUM CHLORIDE FLUSH 0.9 % IV SOLN
INTRAVENOUS | Status: AC
  Filled 2016-12-23: qty 10

## 2016-12-23 MED ORDER — OXYCODONE HCL 5 MG PO TABS: 5.0000 mg | ORAL_TABLET | ORAL | Status: DC | PRN

## 2016-12-23 MED ORDER — CEFAZOLIN SODIUM-DEXTROSE 2-3 GM-% IV SOLR
INTRAVENOUS | Status: DC | PRN
  Administered 2016-12-23: 2 g via INTRAVENOUS

## 2016-12-23 MED ORDER — MIDAZOLAM HCL 2 MG/2ML IJ SOLN
INTRAMUSCULAR | Status: AC
  Filled 2016-12-23: qty 2

## 2016-12-23 SURGICAL SUPPLY — 27 items
BNDG COHESIVE 4X5 TAN STRL (GAUZE/BANDAGES/DRESSINGS) ×3 IMPLANT
BNDG ELASTIC 2X5.8 VLCR STR LF (GAUZE/BANDAGES/DRESSINGS) ×3 IMPLANT
BNDG ESMARK 4X12 TAN STRL LF (GAUZE/BANDAGES/DRESSINGS) ×3 IMPLANT
CANISTER SUCT 1200ML W/VALVE (MISCELLANEOUS) ×3 IMPLANT
CHLORAPREP W/TINT 26ML (MISCELLANEOUS) ×3 IMPLANT
CORD BIP STRL DISP 12FT (MISCELLANEOUS) ×3 IMPLANT
CUFF TOURN 18 STER (MISCELLANEOUS) IMPLANT
DRAPE SURG 17X11 SM STRL (DRAPES) ×3 IMPLANT
FORCEPS JEWEL BIP 4-3/4 STR (INSTRUMENTS) ×3 IMPLANT
GAUZE PETRO XEROFOAM 1X8 (MISCELLANEOUS) ×3 IMPLANT
GAUZE SPONGE 4X4 12PLY STRL (GAUZE/BANDAGES/DRESSINGS) ×3 IMPLANT
GLOVE BIO SURGEON STRL SZ8 (GLOVE) ×3 IMPLANT
GLOVE INDICATOR 8.0 STRL GRN (GLOVE) ×3 IMPLANT
GOWN STRL REUS W/ TWL LRG LVL3 (GOWN DISPOSABLE) ×1 IMPLANT
GOWN STRL REUS W/ TWL XL LVL3 (GOWN DISPOSABLE) ×1 IMPLANT
GOWN STRL REUS W/TWL LRG LVL3 (GOWN DISPOSABLE) ×2
GOWN STRL REUS W/TWL XL LVL3 (GOWN DISPOSABLE) ×2
KIT CARPAL TUNNEL (MISCELLANEOUS) ×2
KIT ESCP INSRT D SLOT CANN KN (MISCELLANEOUS) ×1 IMPLANT
KIT RM TURNOVER STRD PROC AR (KITS) ×3 IMPLANT
NS IRRIG 500ML POUR BTL (IV SOLUTION) ×3 IMPLANT
PACK EXTREMITY ARMC (MISCELLANEOUS) ×3 IMPLANT
SPLINT WRIST LG LT TX990309 (SOFTGOODS) IMPLANT
SPLINT WRIST M LT TX990308 (SOFTGOODS) IMPLANT
SPLINT WRIST M RT TX990303 (SOFTGOODS) IMPLANT
STOCKINETTE IMPERVIOUS 9X36 MD (GAUZE/BANDAGES/DRESSINGS) ×3 IMPLANT
SUT PROLENE 4 0 PS 2 18 (SUTURE) ×3 IMPLANT

## 2016-12-23 NOTE — Transfer of Care (Signed)
Immediate Anesthesia Transfer of Care Note  Patient: Ivan Reyes  Procedure(s) Performed: Procedure(s): CARPAL TUNNEL RELEASE ENDOSCOPIC (Left)  Patient Location: PACU  Anesthesia Type:Bier block  Level of Consciousness: awake, alert  and oriented  Airway & Oxygen Therapy: Patient Spontanous Breathing  Post-op Assessment: Report given to RN and Post -op Vital signs reviewed and stable  Post vital signs: Reviewed and stable  Last Vitals:  Vitals:   12/23/16 1143  BP: 128/66  Pulse: 62  Resp: 16  Temp: (!) 35.9 C    Last Pain:  Vitals:   12/23/16 1143  TempSrc: Tympanic  PainSc: 2       Patients Stated Pain Goal: 0 (50/41/36 4383)  Complications: No apparent anesthesia complications

## 2016-12-23 NOTE — Discharge Instructions (Addendum)
Keep dressing dry and intact. Keep hand elevated above heart level. May shower after dressing removed on postop day 4 (Monday). Cover sutures with Band-Aids after drying off, then re-apply velcro splint. Apply ice to affected area frequently. Take ibuprofen 600 mg TID with meals for 7-10 days, then as necessary. Take pain medication as prescribed when needed.  Return for follow-up in 10-14 days or as scheduled.    AMBULATORY SURGERY  DISCHARGE INSTRUCTIONS   1) The drugs that you were given will stay in your system until tomorrow so for the next 24 hours you should not:  A) Drive an automobile B) Make any legal decisions C) Drink any alcoholic beverage   2) You may resume regular meals tomorrow.  Today it is better to start with liquids and gradually work up to solid foods.  You may eat anything you prefer, but it is better to start with liquids, then soup and crackers, and gradually work up to solid foods.   3) Please notify your doctor immediately if you have any unusual bleeding, trouble breathing, redness and pain at the surgery site, drainage, fever, or pain not relieved by medication.    4) Additional Instructions:        Please contact your physician with any problems or Same Day Surgery at [phone], Monday through Friday 6 am to 4 pm, or [place] [facility] number at [phone].

## 2016-12-23 NOTE — Anesthesia Procedure Notes (Signed)
Anesthesia Regional Block: Bier block (IV Regional)   Pre-Anesthetic Checklist: ,, timeout performed, Correct Patient, Correct Site, Correct Laterality, Correct Procedure, Correct Position,,,,,  Laterality: Left      Narrative:   Additional Notes: Left hand PIV 22 g in situ. Tourniquet palced. Checked. Left arm exanguinated from distal to proximal with esmarch. Tourniquet distal and proximal cuffs inflated 250 mmhg. (-) radial pulse . Injected 50 ml Lidocaine 0.5% . IV d/cd. Tolerated procedure well . vss

## 2016-12-23 NOTE — Anesthesia Post-op Follow-up Note (Cosign Needed)
Anesthesia QCDR form completed.        

## 2016-12-23 NOTE — OR Nursing (Signed)
Dr. Roland Rack in to see pt prior to d/c and told patient to resume Plavix this evening.

## 2016-12-23 NOTE — H&P (Signed)
Paper H&P to be scanned into permanent record. H&P reviewed and patient re-examined. No changes. 

## 2016-12-23 NOTE — Anesthesia Postprocedure Evaluation (Signed)
Anesthesia Post Note  Patient: Ivan Reyes  Procedure(s) Performed: Procedure(s) (LRB): CARPAL TUNNEL RELEASE ENDOSCOPIC (Left)  Patient location during evaluation: PACU Anesthesia Type: Bier Block Level of consciousness: awake and alert Pain management: pain level controlled Vital Signs Assessment: post-procedure vital signs reviewed and stable Respiratory status: spontaneous breathing, nonlabored ventilation, respiratory function stable and patient connected to nasal cannula oxygen Cardiovascular status: blood pressure returned to baseline and stable Postop Assessment: no signs of nausea or vomiting Anesthetic complications: no     Last Vitals:  Vitals:   12/23/16 1143 12/23/16 1416  BP: 128/66 139/80  Pulse: 62 (!) 58  Resp: 16 11  Temp: (!) 35.9 C 36.6 C    Last Pain:  Vitals:   12/23/16 1416  TempSrc:   PainSc: 0-No pain                 Molli Barrows

## 2016-12-23 NOTE — Anesthesia Preprocedure Evaluation (Signed)
Anesthesia Evaluation  Patient identified by MRN, date of birth, ID band Patient awake    Reviewed: Allergy & Precautions, H&P , NPO status , Patient's Chart, lab work & pertinent test results, reviewed documented beta blocker date and time   Airway Mallampati: II   Neck ROM: full    Dental  (+) Poor Dentition   Pulmonary neg pulmonary ROS, sleep apnea and Continuous Positive Airway Pressure Ventilation , Current Smoker,    Pulmonary exam normal        Cardiovascular hypertension, + Peripheral Vascular Disease  negative cardio ROS Normal cardiovascular exam Rhythm:regular Rate:Normal     Neuro/Psych negative neurological ROS  negative psych ROS   GI/Hepatic negative GI ROS, Neg liver ROS,   Endo/Other  negative endocrine ROS  Renal/GU negative Renal ROS  negative genitourinary   Musculoskeletal   Abdominal   Peds  Hematology negative hematology ROS (+)   Anesthesia Other Findings Past Medical History: No date: Arthritis No date: Coronary artery disease No date: Hyperlipidemia No date: Hypertension No date: Peripheral vascular disease (HCC) No date: Sleep apnea     Comment: use C-PAP Past Surgical History: No date: CAROTID ENDARTERECTOMY Bilateral     Comment: Dr. Delana Meyer, [facility] No date: CORONARY ANGIOPLASTY No date: EYE SURGERY Bilateral     Comment: Cataract Extraction with IOL No date: FRACTURE SURGERY Right     Comment: Hip Pinning, Dr. Franchot Mimes, Jr No date: JOINT REPLACEMENT 06/30/2016: KNEE ARTHROPLASTY Left     Comment: Procedure: COMPUTER ASSISTED TOTAL KNEE               ARTHROPLASTY;  Surgeon: Dereck Leep, MD;                Location: [facility] ORS;  Service: Orthopedics;                Laterality: Left; BMI    Body Mass Index:  24.97 kg/m     Reproductive/Obstetrics negative OB ROS                             Anesthesia Physical Anesthesia Plan  ASA:  III  Anesthesia Plan: General and Bier Block   Post-op Pain Management:    Induction:   Airway Management Planned:   Additional Equipment:   Intra-op Plan:   Post-operative Plan:   Informed Consent: I have reviewed the patients History and Physical, chart, labs and discussed the procedure including the risks, benefits and alternatives for the proposed anesthesia with the patient or authorized representative who has indicated his/her understanding and acceptance.   Dental Advisory Given  Plan Discussed with: CRNA  Anesthesia Plan Comments:         Anesthesia Quick Evaluation

## 2016-12-23 NOTE — Op Note (Signed)
12/23/2016  2:26 PM  Patient:   Ivan Reyes  Pre-Op Diagnosis:   Left carpal tunnel syndrome.  Post-Op Diagnosis:   Same.  Procedure:   Endoscopic left carpal tunnel release.  Surgeon:   Pascal Lux, MD  Anesthesia:   Bier block  Findings:   As above.  Complications:   None  EBL:   0 cc  Fluids:   400 cc crystalloid  TT:   26 minutes at 250 mmHg  Drains:   None  Closure:   4-0 Prolene interrupted sutures  Brief Clinical Note:   The patient is a 71 year old male with a long history of bilateral hand and wrist pain and paresthesias, left more symptomatic than right. His symptoms have persisted despite medications, activity modification, etc. His history and examination are consistent with bilateral carpal tunnel syndrome which was confirmed by EMG. The patient presents at this time for an endoscopic left carpal tunnel release.   Procedure:   The patient was brought into the operating room and lain in the supine position. After adequate IV sedation was achieved, a timeout was performed to verify the appropriate surgical site before a Bier block was placed by the anesthesiologist and the tourniquet inflated to 250 mmHg. The left hand and upper extremity were prepped with ChloraPrep solution before being draped sterilely. Preoperative antibiotics were administered. An approximately 1.5-2 cm incision was made over the volar wrist flexion crease, centered over the palmaris longus tendon. The incision was carried down through the subcutaneous tissues with care taken to identify and protect any neurovascular structures. The distal forearm fascia was penetrated just proximal to the transverse carpal ligament. The soft tissues were released off the superficial and deep surfaces of the distal forearm fascia and this was released proximally for 3-4 cm under direct visualization.  Attention was directed distally. The Soil scientist was passed beneath the transverse carpal ligament  along the ulnar aspect of the carpal tunnel and used to release any adhesions as well as to remove any adherent synovial tissue before first the smaller then the larger of the two dilators were passed beneath the transverse carpal ligament along the ulnar margin of the carpal tunnel. The slotted cannula was introduced and the endoscope was placed into the slotted cannula and the undersurface of the transverse carpal ligament visualized. The distal margin of the transverse carpal ligament was marked by placing a 25-gauge needle percutaneously at [place] cardinal point so that it entered the distal portion of the slotted cannula. Under endoscopic visualization, the transverse carpal ligament was released from proximal to distal using the end-cutting blade. A second pass was performed to ensure complete release of the ligament. The adequacy of release was verified both endoscopically and by palpation using the freer elevator.  The wound was irrigated thoroughly with sterile saline solution before being closed using 4-0 Prolene interrupted sutures. A total of 10 cc of 0.5% plain Sensorcaine was injected in and around the incision before a sterile bulky dressing was applied to the wound. The patient was placed into a volar wrist splint before being awakened and returned to the recovery room in satisfactory condition after tolerating the procedure well.

## 2016-12-24 ENCOUNTER — Encounter: Payer: Self-pay | Admitting: Surgery

## 2016-12-29 DIAGNOSIS — G5601 Carpal tunnel syndrome, right upper limb: Secondary | ICD-10-CM | POA: Insufficient documentation

## 2016-12-31 DIAGNOSIS — I6523 Occlusion and stenosis of bilateral carotid arteries: Secondary | ICD-10-CM | POA: Diagnosis not present

## 2016-12-31 DIAGNOSIS — G4733 Obstructive sleep apnea (adult) (pediatric): Secondary | ICD-10-CM | POA: Diagnosis not present

## 2016-12-31 DIAGNOSIS — E782 Mixed hyperlipidemia: Secondary | ICD-10-CM | POA: Diagnosis not present

## 2016-12-31 DIAGNOSIS — I10 Essential (primary) hypertension: Secondary | ICD-10-CM | POA: Diagnosis not present

## 2016-12-31 DIAGNOSIS — I2510 Atherosclerotic heart disease of native coronary artery without angina pectoris: Secondary | ICD-10-CM | POA: Diagnosis not present

## 2017-01-06 ENCOUNTER — Encounter
Admission: RE | Admit: 2017-01-06 | Discharge: 2017-01-06 | Disposition: A | Discharge status: Home / Self Care | Payer: PPO | Source: Ambulatory Visit | Attending: Surgery | Admitting: Surgery

## 2017-01-06 NOTE — Patient Instructions (Signed)
  Your procedure is scheduled on: 01-13-17 Report to Same Day Surgery 2nd floor medical mall [facility] Entrance-take elevator on left to 2nd floor.  Check in with surgery information desk.) To find out your arrival time please call [phone] between 1PM - 3PM on  01-11-17  Remember: Instructions that are not followed completely may result in serious medical risk, up to and including death, or upon the discretion of your surgeon and anesthesiologist your surgery may need to be rescheduled.    _x___ 1. Do not eat food or drink liquids after midnight. No gum chewing or hard candies.     __x__ 2. No Alcohol for 24 hours before or after surgery.   __x__3. No Smoking for 24 prior to surgery.   ____  4. Bring all medications with you on the day of surgery if instructed.    __x__ 5. Notify your doctor if there is any change in your medical condition     (cold, fever, infections).     Do not wear jewelry, make-up, hairpins, clips or nail polish.  Do not wear lotions, powders, or perfumes. You may wear deodorant.  Do not shave 48 hours prior to surgery. Men may shave face and neck.  Do not bring valuables to the hospital.    [facility] is not responsible for any belongings or valuables.               Contacts, dentures or bridgework may not be worn into surgery.  Leave your suitcase in the car. After surgery it may be brought to your room.  For patients admitted to the hospital, discharge time is determined by your  treatment team.   Patients discharged the day of surgery will not be allowed to drive home.  You will need someone to drive you home and stay with you the night of your procedure.    Please read over the following fact sheets that you were given:   [facility] Preparing for Surgery and or MRSA Information   _x___ Take anti-hypertensive (unless it includes a diuretic), cardiac, seizure, asthma,     anti-reflux and psychiatric medicines. These include:  1. ENALAPRIL  2.  GABAPENTIN  3.FENOFIBRATE  4.  5.  6.  ____Fleets enema or Magnesium Citrate as directed.   ____ Use CHG Soap or sage wipes as directed on instruction sheet   ____ Use inhalers on the day of surgery and bring to hospital day of surgery  ____ Stop Metformin and Janumet 2 days prior to surgery.    ____ Take 1/2 of usual insulin dose the night before surgery and none on the morning     surgery.   _x___ Follow recommendations from Cardiologist, Pulmonologist or PCP regarding          stopping Aspirin, Coumadin, Pllavix ,Eliquis, Effient, or Pradaxa, and Pletal-PT STATES HE IS TO STOP ASA ON Sunday 01-09-17  X____Stop Anti-inflammatories such as Advil, Aleve, IBUPROFEN, Motrin, Naproxen, Naprosyn, Goodies powders or aspirin products NOW-OK to take Tylenol    _x___ Stop supplements until after surgery-STOP GLUCOSAMINE, FISH OIL AND VIT E NOW   ____ Bring C-Pap to the hospital.

## 2017-01-08 DIAGNOSIS — G5601 Carpal tunnel syndrome, right upper limb: Secondary | ICD-10-CM | POA: Diagnosis not present

## 2017-01-12 MED ORDER — CEFAZOLIN SODIUM-DEXTROSE 2-4 GM/100ML-% IV SOLN
2.0000 g | Freq: Once | INTRAVENOUS | Status: AC
  Administered 2017-01-13: 2 g via INTRAVENOUS

## 2017-01-13 ENCOUNTER — Ambulatory Visit: Payer: PPO | Admitting: Anesthesiology

## 2017-01-13 ENCOUNTER — Encounter
Admission: RE | Disposition: A | Discharge status: Home / Self Care | Payer: Self-pay | Source: Ambulatory Visit | Attending: Surgery

## 2017-01-13 ENCOUNTER — Encounter: Payer: Self-pay | Admitting: *Deleted

## 2017-01-13 ENCOUNTER — Ambulatory Visit
Admission: RE | Admit: 2017-01-13 | Discharge: 2017-01-13 | Disposition: A | Discharge status: Home / Self Care | Payer: PPO | Source: Ambulatory Visit | Attending: Surgery | Admitting: Surgery

## 2017-01-13 DIAGNOSIS — G4730 Sleep apnea, unspecified: Secondary | ICD-10-CM | POA: Insufficient documentation

## 2017-01-13 DIAGNOSIS — I341 Nonrheumatic mitral (valve) prolapse: Secondary | ICD-10-CM | POA: Diagnosis not present

## 2017-01-13 DIAGNOSIS — Z79899 Other long term (current) drug therapy: Secondary | ICD-10-CM | POA: Insufficient documentation

## 2017-01-13 DIAGNOSIS — G5601 Carpal tunnel syndrome, right upper limb: Secondary | ICD-10-CM | POA: Diagnosis not present

## 2017-01-13 DIAGNOSIS — I25119 Atherosclerotic heart disease of native coronary artery with unspecified angina pectoris: Secondary | ICD-10-CM | POA: Insufficient documentation

## 2017-01-13 DIAGNOSIS — E785 Hyperlipidemia, unspecified: Secondary | ICD-10-CM | POA: Insufficient documentation

## 2017-01-13 DIAGNOSIS — Z820 Family history of epilepsy and other diseases of the nervous system: Secondary | ICD-10-CM | POA: Insufficient documentation

## 2017-01-13 DIAGNOSIS — Z801 Family history of malignant neoplasm of trachea, bronchus and lung: Secondary | ICD-10-CM | POA: Insufficient documentation

## 2017-01-13 DIAGNOSIS — Z96652 Presence of left artificial knee joint: Secondary | ICD-10-CM | POA: Diagnosis not present

## 2017-01-13 DIAGNOSIS — F17200 Nicotine dependence, unspecified, uncomplicated: Secondary | ICD-10-CM | POA: Diagnosis not present

## 2017-01-13 DIAGNOSIS — I25719 Atherosclerosis of autologous vein coronary artery bypass graft(s) with unspecified angina pectoris: Secondary | ICD-10-CM | POA: Insufficient documentation

## 2017-01-13 HISTORY — PX: CARPAL TUNNEL RELEASE: SHX101

## 2017-01-13 SURGERY — RELEASE, CARPAL TUNNEL, ENDOSCOPIC
Anesthesia: Monitor Anesthesia Care | Laterality: Right | Wound class: Clean

## 2017-01-13 MED ORDER — OXYCODONE HCL 5 MG/5ML PO SOLN: 5.0000 mg | Freq: Once | ORAL | Status: DC | PRN

## 2017-01-13 MED ORDER — MIDAZOLAM HCL 2 MG/2ML IJ SOLN
INTRAMUSCULAR | Status: AC
  Filled 2017-01-13: qty 2

## 2017-01-13 MED ORDER — METOCLOPRAMIDE HCL 5 MG/ML IJ SOLN: 5.0000 mg | Freq: Three times a day (TID) | INTRAMUSCULAR | Status: DC | PRN

## 2017-01-13 MED ORDER — PROPOFOL 500 MG/50ML IV EMUL
INTRAVENOUS | Status: DC | PRN
  Administered 2017-01-13: 75 ug/kg/min via INTRAVENOUS
  Administered 2017-01-13: 50 ug/kg/min via INTRAVENOUS

## 2017-01-13 MED ORDER — ONDANSETRON HCL 4 MG/2ML IJ SOLN: 4.0000 mg | Freq: Four times a day (QID) | INTRAMUSCULAR | Status: DC | PRN

## 2017-01-13 MED ORDER — ONDANSETRON HCL 4 MG/2ML IJ SOLN
INTRAMUSCULAR | Status: AC
  Filled 2017-01-13: qty 2

## 2017-01-13 MED ORDER — OXYCODONE HCL 5 MG PO TABS: 5.0000 mg | ORAL_TABLET | Freq: Once | ORAL | Status: DC | PRN

## 2017-01-13 MED ORDER — BUPIVACAINE-EPINEPHRINE (PF) 0.5% -1:200000 IJ SOLN
INTRAMUSCULAR | Status: AC
  Filled 2017-01-13: qty 30

## 2017-01-13 MED ORDER — PROMETHAZINE HCL 25 MG/ML IJ SOLN: 6.2500 mg | INTRAMUSCULAR | Status: DC | PRN

## 2017-01-13 MED ORDER — OXYCODONE HCL 5 MG PO TABS: 5.0000 mg | ORAL_TABLET | ORAL | Status: DC | PRN

## 2017-01-13 MED ORDER — BUPIVACAINE-EPINEPHRINE (PF) 0.5% -1:200000 IJ SOLN
INTRAMUSCULAR | Status: DC | PRN
  Administered 2017-01-13: 10 mL via PERINEURAL

## 2017-01-13 MED ORDER — FAMOTIDINE 20 MG PO TABS
20.0000 mg | ORAL_TABLET | Freq: Once | ORAL | Status: AC
  Administered 2017-01-13: 20 mg via ORAL

## 2017-01-13 MED ORDER — LIDOCAINE HCL (PF) 0.5 % IJ SOLN
INTRAMUSCULAR | Status: DC | PRN
  Administered 2017-01-13: 50 mL via INTRAVENOUS

## 2017-01-13 MED ORDER — LIDOCAINE HCL (PF) 0.5 % IJ SOLN
INTRAMUSCULAR | Status: AC
  Filled 2017-01-13: qty 50

## 2017-01-13 MED ORDER — FENTANYL CITRATE (PF) 100 MCG/2ML IJ SOLN
INTRAMUSCULAR | Status: DC | PRN
  Administered 2017-01-13: 50 ug via INTRAVENOUS

## 2017-01-13 MED ORDER — ONDANSETRON HCL 4 MG PO TABS: 4.0000 mg | ORAL_TABLET | Freq: Four times a day (QID) | ORAL | Status: DC | PRN

## 2017-01-13 MED ORDER — FENTANYL CITRATE (PF) 100 MCG/2ML IJ SOLN: 25.0000 ug | INTRAMUSCULAR | Status: DC | PRN

## 2017-01-13 MED ORDER — SODIUM CHLORIDE FLUSH 0.9 % IV SOLN
INTRAVENOUS | Status: AC
  Filled 2017-01-13: qty 10

## 2017-01-13 MED ORDER — MEPERIDINE HCL 50 MG/ML IJ SOLN: 6.2500 mg | INTRAMUSCULAR | Status: DC | PRN

## 2017-01-13 MED ORDER — METOCLOPRAMIDE HCL 10 MG PO TABS: 5.0000 mg | ORAL_TABLET | Freq: Three times a day (TID) | ORAL | Status: DC | PRN

## 2017-01-13 MED ORDER — POTASSIUM CHLORIDE IN NACL 20-0.9 MEQ/L-% IV SOLN
INTRAVENOUS | Status: DC
  Filled 2017-01-13: qty 1000

## 2017-01-13 MED ORDER — LIDOCAINE HCL 2 % EX GEL
CUTANEOUS | Status: AC
Start: 2017-01-13 — End: 2017-01-13
  Filled 2017-01-13: qty 5

## 2017-01-13 MED ORDER — MIDAZOLAM HCL 2 MG/2ML IJ SOLN
INTRAMUSCULAR | Status: DC | PRN
  Administered 2017-01-13: 2 mg via INTRAVENOUS

## 2017-01-13 MED ORDER — PROPOFOL 10 MG/ML IV BOLUS
INTRAVENOUS | Status: AC
Start: 2017-01-13 — End: 2017-01-13
  Filled 2017-01-13: qty 20

## 2017-01-13 MED ORDER — LACTATED RINGERS IV SOLN
INTRAVENOUS | Status: DC
  Administered 2017-01-13 (×2): via INTRAVENOUS

## 2017-01-13 MED ORDER — ONDANSETRON HCL 4 MG/2ML IJ SOLN
INTRAMUSCULAR | Status: DC | PRN
Start: 2017-01-13 — End: 2017-01-13
  Administered 2017-01-13: 4 mg via INTRAVENOUS

## 2017-01-13 MED ORDER — LIDOCAINE HCL (PF) 2 % IJ SOLN
INTRAMUSCULAR | Status: AC
Start: 2017-01-13 — End: 2017-01-13
  Filled 2017-01-13: qty 2

## 2017-01-13 MED ORDER — FENTANYL CITRATE (PF) 100 MCG/2ML IJ SOLN
INTRAMUSCULAR | Status: AC
  Filled 2017-01-13: qty 2

## 2017-01-13 SURGICAL SUPPLY — 27 items
BIT DRILL JUGRKNT W/NDL BIT2.9 (DRILL) ×1 IMPLANT
BNDG COHESIVE 4X5 TAN STRL (GAUZE/BANDAGES/DRESSINGS) ×3 IMPLANT
BNDG ELASTIC 2X5.8 VLCR STR LF (GAUZE/BANDAGES/DRESSINGS) ×3 IMPLANT
BNDG ESMARK 4X12 TAN STRL LF (GAUZE/BANDAGES/DRESSINGS) ×3 IMPLANT
CANISTER SUCT 1200ML W/VALVE (MISCELLANEOUS) ×3 IMPLANT
CHLORAPREP W/TINT 26ML (MISCELLANEOUS) ×3 IMPLANT
CORD BIP STRL DISP 12FT (MISCELLANEOUS) ×3 IMPLANT
CUFF TOURN 18 STER (MISCELLANEOUS) ×3 IMPLANT
DRAPE SURG 17X11 SM STRL (DRAPES) ×3 IMPLANT
DRILL JUGGERKNOT W/NDL BIT 2.9 (DRILL) ×3
FORCEPS JEWEL BIP 4-3/4 STR (INSTRUMENTS) ×3 IMPLANT
GAUZE PETRO XEROFOAM 1X8 (MISCELLANEOUS) ×3 IMPLANT
GAUZE SPONGE 4X4 12PLY STRL (GAUZE/BANDAGES/DRESSINGS) ×3 IMPLANT
GLOVE BIO SURGEON STRL SZ8 (GLOVE) ×3 IMPLANT
GLOVE INDICATOR 8.0 STRL GRN (GLOVE) ×3 IMPLANT
GOWN STRL REUS W/ TWL LRG LVL3 (GOWN DISPOSABLE) ×1 IMPLANT
GOWN STRL REUS W/ TWL XL LVL3 (GOWN DISPOSABLE) ×1 IMPLANT
GOWN STRL REUS W/TWL LRG LVL3 (GOWN DISPOSABLE) ×2
GOWN STRL REUS W/TWL XL LVL3 (GOWN DISPOSABLE) ×2
KIT CARPAL TUNNEL (MISCELLANEOUS) ×2
KIT ESCP INSRT D SLOT CANN KN (MISCELLANEOUS) ×1 IMPLANT
KIT RM TURNOVER STRD PROC AR (KITS) ×3 IMPLANT
NS IRRIG 500ML POUR BTL (IV SOLUTION) ×3 IMPLANT
PACK EXTREMITY ARMC (MISCELLANEOUS) ×3 IMPLANT
SPLINT WRIST LG RT TX900304 (SOFTGOODS) ×3 IMPLANT
STOCKINETTE IMPERVIOUS 9X36 MD (GAUZE/BANDAGES/DRESSINGS) ×3 IMPLANT
SUT PROLENE 4 0 PS 2 18 (SUTURE) ×3 IMPLANT

## 2017-01-13 NOTE — Anesthesia Post-op Follow-up Note (Cosign Needed)
Anesthesia QCDR form completed.        

## 2017-01-13 NOTE — Anesthesia Preprocedure Evaluation (Signed)
Anesthesia Evaluation  Patient identified by MRN, date of birth, ID band Patient awake    Reviewed: Allergy & Precautions, NPO status , Patient's Chart, lab work & pertinent test results  History of Anesthesia Complications Negative for: history of anesthetic complications  Airway Mallampati: II  TM Distance: >3 FB Neck ROM: Full    Dental no notable dental hx.    Pulmonary sleep apnea and Continuous Positive Airway Pressure Ventilation , neg COPD, Current Smoker,    breath sounds clear to auscultation- rhonchi (-) wheezing      Cardiovascular Exercise Tolerance: Good hypertension, Pt. on medications (-) CAD, (-) Past MI and (-) Cardiac Stents  Rhythm:Regular Rate:Normal - Systolic murmurs and - Diastolic murmurs    Neuro/Psych negative neurological ROS  negative psych ROS   GI/Hepatic negative GI ROS, Neg liver ROS,   Endo/Other  negative endocrine ROSneg diabetes  Renal/GU negative Renal ROS     Musculoskeletal   Abdominal (+) - obese,   Peds  Hematology negative hematology ROS (+)   Anesthesia Other Findings Past Medical History: No date: Arthritis No date: Coronary artery disease No date: Hyperlipidemia No date: Hypertension No date: Peripheral vascular disease (HCC) No date: Sleep apnea     Comment: use C-PAP   Reproductive/Obstetrics                             Anesthesia Physical Anesthesia Plan  ASA: II  Anesthesia Plan: Bier Block and MAC   Post-op Pain Management:    Induction:   Airway Management Planned: Natural Airway  Additional Equipment:   Intra-op Plan:   Post-operative Plan:   Informed Consent: I have reviewed the patients History and Physical, chart, labs and discussed the procedure including the risks, benefits and alternatives for the proposed anesthesia with the patient or authorized representative who has indicated his/her understanding and  acceptance.   Dental advisory given  Plan Discussed with: CRNA and Anesthesiologist  Anesthesia Plan Comments:         Anesthesia Quick Evaluation

## 2017-01-13 NOTE — Anesthesia Procedure Notes (Addendum)
Anesthesia Regional Block: Bier block (IV Regional)   Pre-Anesthetic Checklist: ,, timeout performed, Correct Patient, Correct Site, Correct Laterality, Correct Procedure, Correct Position, site marked, Risks and benefits discussed,  Surgical consent,  Pre-op evaluation,  At surgeon's request and post-op pain management  Laterality: Right  Prep: alcohol swabs       Needles:  Injection technique: Single-shot      Needle Gauge: 20     Additional Needles:   Procedures:,,,,,,, Esmarch exsanguination, #20gu IV placed and double tourniquet utilized  Narrative:  Start time: 01/13/2017 12:50 PM End time: 01/13/2017 1:00 PM Injection made incrementally with aspirations every 5 mL.  Performed by: Personally  Anesthesiologist: Randa Lynn, Astaria Nanez  Additional Notes: The patient tolerated the procedure well.

## 2017-01-13 NOTE — H&P (Signed)
Paper H&P to be scanned into permanent record. H&P reviewed and patient re-examined. No changes. 

## 2017-01-13 NOTE — Anesthesia Procedure Notes (Signed)
Procedure Name: MAC Date/Time: 01/13/2017 12:50 PM Performed by: Allean Found Pre-anesthesia Checklist: Patient identified, Emergency Drugs available, Suction available, Patient being monitored and Timeout performed Patient Re-evaluated:Patient Re-evaluated prior to inductionOxygen Delivery Method: Nasal cannula Placement Confirmation: positive ETCO2 Dental Injury: Teeth and Oropharynx as per pre-operative assessment

## 2017-01-13 NOTE — Op Note (Addendum)
01/13/2017  1:37 PM  Patient:   Ivan Reyes  Pre-Op Diagnosis:   Right carpal tunnel syndrome.  Post-Op Diagnosis:   Same.  Procedure:   Endoscopic right carpal tunnel release.  Surgeon:   Pascal Lux, MD  Anesthesia:   Bier block  Findings:   As above.  Complications:   None  EBL:   0 cc  Fluids:   500 cc crystalloid  TT:   33 minutes at 250 mmHg  Drains:   None  Closure:   4-0 Prolene interrupted sutures  Brief Clinical Note:   The patient is a 71 year old male with a history of bilateral hand and wrist pain and paresthesias. His history and examination were consistent with bilateral carpal tunnel syndrome which was confirmed by EMG. He underwent an endoscopic left carpal tunnel release 3 weeks ago. He presents at this time for an endoscopic right carpal tunnel release.   Procedure:   The patient was brought into the operating room and lain in the supine position. After adequate IV sedation was achieved, a timeout was performed to verify the appropriate surgical site before a Bier block was placed by the anesthesiologist and the tourniquet inflated to 250 mmHg. The right hand and upper extremity were prepped with ChloraPrep solution before being draped sterilely. Preoperative antibiotics were administered. An approximately 1.5-2 cm incision was made over the volar wrist flexion crease, centered over the palmaris longus tendon. The incision was carried down through the subcutaneous tissues with care taken to identify and protect any neurovascular structures. The distal forearm fascia was penetrated just proximal to the transverse carpal ligament. The soft tissues were released off the superficial and deep surfaces of the distal forearm fascia and this was released proximally for 3-4 cm under direct visualization.  Attention was directed distally. The Soil scientist was passed beneath the transverse carpal ligament along the ulnar aspect of the carpal tunnel and used to  release any adhesions as well as to remove any adherent synovial tissue before first the smaller then the larger of the two dilators were passed beneath the transverse carpal ligament along the ulnar margin of the carpal tunnel. The slotted cannula was introduced and the endoscope was placed into the slotted cannula and the undersurface of the transverse carpal ligament visualized. The distal margin of the transverse carpal ligament was marked by placing a 25-gauge needle percutaneously at [place] cardinal point so that it entered the distal portion of the slotted cannula. Under endoscopic visualization, the transverse carpal ligament was released from proximal to distal using the end-cutting blade. A second pass was performed to ensure complete release of the ligament. The adequacy of release was verified both endoscopically and by palpation using the freer elevator.  The wound was irrigated thoroughly with sterile saline solution before being closed using 4-0 Prolene interrupted sutures. A total of 10 cc of 0.5% plain Sensorcaine was injected in and around the incision before a sterile bulky dressing was applied to the wound. The patient was placed into a volar wrist splint before being awakened and returned to the recovery room in satisfactory condition after tolerating the procedure well.

## 2017-01-13 NOTE — Anesthesia Postprocedure Evaluation (Signed)
Anesthesia Post Note  Patient: Ivan Reyes  Procedure(s) Performed: Procedure(s) (LRB): CARPAL TUNNEL RELEASE ENDOSCOPIC (Right)  Patient location during evaluation: PACU Anesthesia Type: MAC and Bier Block Level of consciousness: awake and alert and oriented Pain management: pain level controlled Vital Signs Assessment: post-procedure vital signs reviewed and stable Respiratory status: spontaneous breathing, nonlabored ventilation and respiratory function stable Cardiovascular status: blood pressure returned to baseline and stable Postop Assessment: no signs of nausea or vomiting Anesthetic complications: no     Last Vitals:  Vitals:   01/13/17 1426 01/13/17 1452  BP: (!) 175/59 (!) 167/76  Pulse: 60 62  Resp: 16 16  Temp: 36.2 C     Last Pain:  Vitals:   01/13/17 1426  TempSrc: Temporal  PainSc:                  Deyja Sochacki

## 2017-01-13 NOTE — Discharge Instructions (Addendum)
Keep dressing dry and intact. Keep hand elevated above heart level. May shower after dressing removed on postop day 4 (Monday). Cover sutures with Band-Aids after drying off. Apply ice to affected area frequently. Take ibuprofen 800 mg TID with meals for 7-10 days, then as necessary. Take pain medication as prescribed and/or ES Tylenol when needed.  Return for follow-up in 10-14 days or as scheduled.  AMBULATORY SURGERY  DISCHARGE INSTRUCTIONS   1) The drugs that you were given will stay in your system until tomorrow so for the next 24 hours you should not:  A) Drive an automobile B) Make any legal decisions C) Drink any alcoholic beverage   2) You may resume regular meals tomorrow.  Today it is better to start with liquids and gradually work up to solid foods.  You may eat anything you prefer, but it is better to start with liquids, then soup and crackers, and gradually work up to solid foods.   3) Please notify your doctor immediately if you have any unusual bleeding, trouble breathing, redness and pain at the surgery site, drainage, fever, or pain not relieved by medication.    4) Additional Instructions:   Please contact your physician with any problems or Same Day Surgery at [phone], Monday through Friday 6 am to 4 pm, or [place] at [facility] number at [phone].

## 2017-01-13 NOTE — Transfer of Care (Signed)
Immediate Anesthesia Transfer of Care Note  Patient: Ivan Reyes  Procedure(s) Performed: Procedure(s): CARPAL TUNNEL RELEASE ENDOSCOPIC (Right)  Patient Location: PACU  Anesthesia Type:MAC  Level of Consciousness: awake  Airway & Oxygen Therapy: Patient Spontanous Breathing and Patient connected to nasal cannula oxygen  Post-op Assessment: Report given to RN and Post -op Vital signs reviewed and stable  Post vital signs: Reviewed and stable  Last Vitals:  Vitals:   01/13/17 1015 01/13/17 1339  BP: (!) 162/69 (P) 133/72  Pulse: 65   Resp: 16   Temp: 36.2 C (!) 36.1 C    Last Pain:  Vitals:   01/13/17 1339  TempSrc:   PainSc: (P) 0-No pain         Complications: No apparent anesthesia complications

## 2017-02-02 DIAGNOSIS — I519 Heart disease, unspecified: Secondary | ICD-10-CM | POA: Diagnosis not present

## 2017-02-02 DIAGNOSIS — E039 Hypothyroidism, unspecified: Secondary | ICD-10-CM | POA: Diagnosis not present

## 2017-02-02 DIAGNOSIS — I10 Essential (primary) hypertension: Secondary | ICD-10-CM | POA: Diagnosis not present

## 2017-02-08 DIAGNOSIS — I2510 Atherosclerotic heart disease of native coronary artery without angina pectoris: Secondary | ICD-10-CM | POA: Diagnosis not present

## 2017-02-08 DIAGNOSIS — Z0001 Encounter for general adult medical examination with abnormal findings: Secondary | ICD-10-CM | POA: Diagnosis not present

## 2017-02-18 DIAGNOSIS — D649 Anemia, unspecified: Secondary | ICD-10-CM | POA: Diagnosis not present

## 2017-04-04 DIAGNOSIS — M542 Cervicalgia: Secondary | ICD-10-CM | POA: Diagnosis not present

## 2017-04-04 DIAGNOSIS — M50323 Other cervical disc degeneration at C6-C7 level: Secondary | ICD-10-CM | POA: Diagnosis not present

## 2017-04-04 DIAGNOSIS — M50322 Other cervical disc degeneration at C5-C6 level: Secondary | ICD-10-CM | POA: Diagnosis not present

## 2017-05-02 ENCOUNTER — Other Ambulatory Visit: Payer: Self-pay | Admitting: Surgery

## 2017-05-02 DIAGNOSIS — M47812 Spondylosis without myelopathy or radiculopathy, cervical region: Secondary | ICD-10-CM | POA: Diagnosis not present

## 2017-05-13 ENCOUNTER — Ambulatory Visit
Admission: RE | Admit: 2017-05-13 | Discharge: 2017-05-13 | Disposition: A | Discharge status: Home / Self Care | Payer: PPO | Source: Ambulatory Visit | Attending: Surgery | Admitting: Surgery

## 2017-05-13 DIAGNOSIS — M5031 Other cervical disc degeneration,  high cervical region: Secondary | ICD-10-CM | POA: Diagnosis not present

## 2017-05-13 DIAGNOSIS — M47812 Spondylosis without myelopathy or radiculopathy, cervical region: Secondary | ICD-10-CM | POA: Diagnosis not present

## 2017-05-13 DIAGNOSIS — M542 Cervicalgia: Secondary | ICD-10-CM | POA: Diagnosis not present

## 2017-05-13 DIAGNOSIS — M4802 Spinal stenosis, cervical region: Secondary | ICD-10-CM | POA: Insufficient documentation

## 2017-05-19 DIAGNOSIS — M542 Cervicalgia: Secondary | ICD-10-CM | POA: Diagnosis not present

## 2017-05-19 DIAGNOSIS — M47812 Spondylosis without myelopathy or radiculopathy, cervical region: Secondary | ICD-10-CM | POA: Diagnosis not present

## 2017-05-25 ENCOUNTER — Ambulatory Visit
Payer: PPO | Attending: Student in an Organized Health Care Education/Training Program | Admitting: Student in an Organized Health Care Education/Training Program

## 2017-05-25 ENCOUNTER — Encounter: Payer: Self-pay | Admitting: Student in an Organized Health Care Education/Training Program

## 2017-05-25 VITALS — BP 163/84 | HR 73 | Temp 98.8°F | Resp 18 | Ht 70.0 in | Wt 183.0 lb

## 2017-05-25 DIAGNOSIS — I6529 Occlusion and stenosis of unspecified carotid artery: Secondary | ICD-10-CM | POA: Insufficient documentation

## 2017-05-25 DIAGNOSIS — I2510 Atherosclerotic heart disease of native coronary artery without angina pectoris: Secondary | ICD-10-CM | POA: Insufficient documentation

## 2017-05-25 DIAGNOSIS — E785 Hyperlipidemia, unspecified: Secondary | ICD-10-CM | POA: Diagnosis not present

## 2017-05-25 DIAGNOSIS — I129 Hypertensive chronic kidney disease with stage 1 through stage 4 chronic kidney disease, or unspecified chronic kidney disease: Secondary | ICD-10-CM | POA: Diagnosis not present

## 2017-05-25 DIAGNOSIS — G8929 Other chronic pain: Secondary | ICD-10-CM | POA: Diagnosis not present

## 2017-05-25 DIAGNOSIS — G4730 Sleep apnea, unspecified: Secondary | ICD-10-CM | POA: Insufficient documentation

## 2017-05-25 DIAGNOSIS — M542 Cervicalgia: Secondary | ICD-10-CM | POA: Diagnosis not present

## 2017-05-25 DIAGNOSIS — I70219 Atherosclerosis of native arteries of extremities with intermittent claudication, unspecified extremity: Secondary | ICD-10-CM | POA: Diagnosis not present

## 2017-05-25 DIAGNOSIS — I70213 Atherosclerosis of native arteries of extremities with intermittent claudication, bilateral legs: Secondary | ICD-10-CM | POA: Diagnosis not present

## 2017-05-25 DIAGNOSIS — I6523 Occlusion and stenosis of bilateral carotid arteries: Secondary | ICD-10-CM

## 2017-05-25 DIAGNOSIS — F17210 Nicotine dependence, cigarettes, uncomplicated: Secondary | ICD-10-CM | POA: Diagnosis not present

## 2017-05-25 DIAGNOSIS — N189 Chronic kidney disease, unspecified: Secondary | ICD-10-CM | POA: Insufficient documentation

## 2017-05-25 DIAGNOSIS — M47812 Spondylosis without myelopathy or radiculopathy, cervical region: Secondary | ICD-10-CM | POA: Diagnosis not present

## 2017-05-25 DIAGNOSIS — M5030 Other cervical disc degeneration, unspecified cervical region: Secondary | ICD-10-CM | POA: Diagnosis not present

## 2017-05-25 NOTE — Progress Notes (Signed)
Safety precautions to be maintained throughout the outpatient stay will include: orient to surroundings, keep bed in low position, maintain call bell within reach at all times, provide assistance with transfer out of bed and ambulation.  

## 2017-05-25 NOTE — Progress Notes (Signed)
Patient's Name: Ivan Reyes  MRN: [id number]  Referring Provider: Meade Maw, MD  DOB: May 01, 1946  PCP: Madelyn Brunner, MD  DOS: 05/25/2017  Note by: Gillis Santa, MD  Service setting: Ambulatory outpatient  Specialty: Interventional Pain Management  Location: [facility] (AMB) Pain Management Facility    Patient type: New patient ("FAST-TRACK" Evaluation)   Warning: This referral option does not include the extensive pharmacological evaluation required for [place] to take over the patient's medication management. The "Fast-Track" system is designed to bypass the new patient referral waiting list, as well as the normal patient evaluation process, in order to provide a patient in distress with a timely pain management intervention. Because the system was not designed to unfairly get a patient into our pain practice ahead of those already waiting, certain restrictions apply. By requesting a "Fast-Track" consult, the referring physician has opted to continue managing the patient's medications in order to get interventional urgent care.  Primary Reason for Visit: Interventional Pain Management Treatment. CC: Neck Pain   HPI  Ivan Reyes is a 71 y.o. year old, male patient, who comes today for a  "Fast-Track" new patient evaluation, as requested by Meade Maw, MD. The patient has been made aware that this type of referral option is reserved for the Interventional Pain Management portion of our practice and completely excludes the option of medication management. His primarily concern today is the Neck Pain  Pain Assessment: Location: Left Neck Radiating: right arm Onset: More than a month ago Duration: Chronic pain Quality: Tingling, Numbness, Stabbing Severity: 6 /10 (self-reported pain score)  Note: Reported level is compatible with observation.                   When using our objective Pain Scale, levels between 6 and 10/10 are said to belong in an emergency room, as it  progressively worsens from a 6/10, described as severely limiting, requiring emergency care not usually available at an outpatient pain management facility. At a 6/10 level, communication becomes difficult and requires great effort. Assistance to reach the emergency department may be required. Facial flushing and profuse sweating along with potentially dangerous increases in heart rate and blood pressure will be evident. Effect on ADL: has made him much more sedintary Timing: Constant Modifying factors: sleep, being sedintary  Onset and Duration: Present longer than 3 months Cause of pain: Unknown Severity: Getting worse, NAS-11 at its worse: 10/10, NAS-11 at its best: 2/10, NAS-11 now: 4/10 and NAS-11 on the average: 6/10 Timing: Afternoon Aggravating Factors: Stooping , Twisting and Working Alleviating Factors: Lying down, Sleeping and Using a brace Associated Problems: Numbness and Tingling Quality of Pain: Aching, Agonizing, Annoying, Nagging, Sharp, Shooting, Stabbing and Tingling Previous Examinations or Tests: MRI scan, Nerve conduction test and Neurological evaluation Previous Treatments: The patient denies question unanswered  The patient comes into the clinics today, referred to [place] for a bilateral C5/C6 and C6/C7 facet injections.  Briefly, patient endorses left neck pain and left wrist pain and finger numbness. Of note patient has a history of bilateral carpal tunnel surgery with his right carpal tunnel decompression done at the end of May. He endorses numbness along his index middle and pinkie fingers.  In regards to the patient's neck pain,It is more pronounced on the left and radiates to the anterior part of the shoulder along with the posterior aspect and the medial portion of the scapula. No obvious weakness. Does endorse numbness and tingling which is likely secondary from his  carpal tunnel surgery.  Of note patient does have an impressive cardiovascular history with multiple  coronary stents, carotid endarterectomy for carotid stenosis. Patient is on Plavix.  Meds   Current Outpatient Prescriptions:  .  Cholecalciferol (VITAMIN D) 2000 units CAPS, Take 2,000 Units by mouth every evening., Disp: , Rfl:  .  clopidogrel (PLAVIX) 75 MG tablet, Take 75 mg by mouth daily., Disp: , Rfl:  .  enalapril (VASOTEC) 20 MG tablet, Take 20 mg by mouth 2 (two) times daily., Disp: , Rfl:  .  fenofibrate micronized (LOFIBRA) 134 MG capsule, Take 134 mg by mouth daily before breakfast. , Disp: , Rfl:  .  Glucosamine HCl (GLUCOSAMINE PO), Take 1 tablet by mouth 2 (two) times daily., Disp: , Rfl:  .  hydrochlorothiazide (HYDRODIURIL) 12.5 MG tablet, Take 12.5 mg by mouth daily., Disp: , Rfl:  .  ibuprofen (ADVIL,MOTRIN) 800 MG tablet, Take 800 mg by mouth every 8 (eight) hours as needed for headache., Disp: , Rfl:  .  Omega-3 Fatty Acids (OMEGA-3 FISH OIL) 300 MG CAPS, Take 300 mg by mouth 2 (two) times daily., Disp: , Rfl:  .  sildenafil (REVATIO) 20 MG tablet, Take 60-100 mg by mouth daily as needed (for erectile dysfunction)., Disp: , Rfl:  .  simvastatin (ZOCOR) 40 MG tablet, Take 40 mg by mouth daily at 6 PM. , Disp: , Rfl:  .  vitamin B-12 (CYANOCOBALAMIN) 1000 MCG tablet, Take 1,000 mcg by mouth daily., Disp: , Rfl:  .  vitamin E 400 UNIT capsule, Take 400 Units by mouth every evening. , Disp: , Rfl:  .  gabapentin (NEURONTIN) 100 MG capsule, Take 200 mg by mouth 4 (four) times daily. , Disp: , Rfl:  .  oxyCODONE (ROXICODONE) 5 MG immediate release tablet, Take 1-2 tablets (5-10 mg total) by mouth every 4 (four) hours as needed for severe pain. (Patient not taking: Reported on 01/04/2017), Disp: 30 tablet, Rfl: 0  Imaging Review  Cervical Imaging: Cervical MR wo contrast:  Results for orders placed during the hospital encounter of 05/13/17  MR CERVICAL SPINE WO CONTRAST   Narrative CLINICAL DATA:  Left neck pain. Pain in both hands. Symptoms for 1 year.  EXAM: MRI CERVICAL  SPINE WITHOUT CONTRAST  TECHNIQUE: Multiplanar, multisequence MR imaging of the cervical spine was performed. No intravenous contrast was administered.  COMPARISON:  Neck CTA 04/23/2011.  FINDINGS: Alignment: Mildly exaggerated cervical lordosis. Trace anterolisthesis of C4 on C5 and trace retrolisthesis of C5 on C6.  Vertebrae: No fracture or suspicious osseous lesion. Mild degenerative endplate edema on the left at C4-5. Mild facet edema on the left at C4-5 as well with a joint effusion.  Cord: Normal signal.  Posterior Fossa, vertebral arteries, paraspinal tissues: Unremarkable.  Disc levels:  C2-3: Severe left facet arthrosis and mild uncovertebral spurring result in mild left neural foraminal stenosis. The left facet joint appears ankylosed. No spinal stenosis.  C3-4: Disc bulging, uncovertebral spurring, infolding of the ligamentum flavum, and mild right and moderate left facet arthrosis result in mild spinal stenosis with a minimal impression on the spinal cord and moderate bilateral neural foraminal stenosis.  C4-5: Minimal disc bulging, uncovertebral spurring, infolding of the ligamentum flavum, and mild right and moderate left facet arthrosis result in mild spinal stenosis and minimal to mild right and mild-to-moderate left neural foraminal stenosis.  C5-6: Mild disc space narrowing. Disc bulging, uncovertebral spurring, infolding of the ligamentum flavum, and mild facet arthrosis result in mild spinal stenosis with  a minimal impression on the spinal cord and moderate bilateral neural foraminal stenosis.  C6-7: Mild disc space narrowing. Broad-based posterior disc osteophyte complex and mild facet arthrosis result in moderate right and mild left neural foraminal stenosis without spinal stenosis.  C7-T1:  Mild facet arthrosis without significant stenosis.  T1-2 and T2-3: Incompletely imaged axially. Mild left greater than right facet arthrosis without  stenosis.  IMPRESSION: 1. Multilevel cervical disc and facet degeneration with mild spinal stenosis at C3-4, C4-5, and C5-6. 2. Moderate multilevel foraminal stenosis as above.   Electronically Signed   By: Logan Bores M.D.   On: 05/13/2017 11:46        Complexity Note: Imaging results reviewed. Results shared with Ivan Reyes, using Layman's terms.                         ROS  Cardiovascular History: Blood thinners:  AntiplateletPlavix Pulmonary or Respiratory History: Smoking Neurological History: No reported neurological signs or symptoms such as seizures, abnormal skin sensations, urinary and/or fecal incontinence, being born with an abnormal open spine and/or a tethered spinal cord Review of Past Neurological Studies: No results found for this or any previous visit. Psychological-Psychiatric History: No reported psychological or psychiatric signs or symptoms such as difficulty sleeping, anxiety, depression, delusions or hallucinations (schizophrenial), mood swings (bipolar disorders) or suicidal ideations or attempts Gastrointestinal History: No reported gastrointestinal signs or symptoms such as vomiting or evacuating blood, reflux, heartburn, alternating episodes of diarrhea and constipation, inflamed or scarred liver, or pancreas or irrregular and/or infrequent bowel movements Genitourinary History: No reported renal or genitourinary signs or symptoms such as difficulty voiding or producing urine, peeing blood, non-functioning kidney, kidney stones, difficulty emptying the bladder, difficulty controlling the flow of urine, or chronic kidney disease Hematological History: Brusing easily Endocrine History: No reported endocrine signs or symptoms such as high or low blood sugar, rapid heart rate due to high thyroid levels, obesity or weight gain due to slow thyroid or thyroid disease Rheumatologic History: No reported rheumatological signs and symptoms such as fatigue, joint  pain, tenderness, swelling, redness, heat, stiffness, decreased range of motion, with or without associated rash Musculoskeletal History: Negative for myasthenia gravis, muscular dystrophy, multiple sclerosis or malignant hyperthermia Work History: Working full time  Allergies  Ivan Reyes has No Known Allergies.  Laboratory Chemistry  Inflammation Markers (CRP: Acute Phase) (ESR: Chronic Phase) Lab Results  Component Value Date   ESRSEDRATE 6 06/16/2016                 Renal Function Markers Lab Results  Component Value Date   BUN 13 07/02/2016   CREATININE 0.82 07/02/2016   GFRAA >60 07/02/2016   GFRNONAA >60 07/02/2016                 Hepatic Function Markers Lab Results  Component Value Date   AST 26 06/16/2016   ALT 16 (L) 06/16/2016   ALBUMIN 4.4 06/16/2016   ALKPHOS 39 06/16/2016                 Electrolytes Lab Results  Component Value Date   NA 135 07/02/2016   K 4.1 12/22/2016   CL 103 07/02/2016   CALCIUM 8.7 (L) 07/02/2016                 Neuropathy Markers No results found for: [id number]               Bone Pathology Markers  Lab Results  Component Value Date   ALKPHOS 39 06/16/2016   CALCIUM 8.7 (L) 07/02/2016                 Coagulation Parameters Lab Results  Component Value Date   INR 0.92 06/16/2016   LABPROT 12.3 06/16/2016   APTT 28 06/16/2016   PLT 245 12/22/2016                 Cardiovascular Markers Lab Results  Component Value Date   HGB 13.4 12/22/2016   HCT 40.3 12/22/2016                 Note: Lab results reviewed.  [place]  Drug: Ivan Reyes  reports that he does not use drugs. Alcohol:  reports that he drinks about 1.2 oz of alcohol per week . Tobacco:  reports that he has been smoking Cigarettes.  He has been smoking about 1.00 pack per day. He has never used smokeless tobacco. Medical:  has a past medical history of Arthritis; Coronary artery disease; Hyperlipidemia; Hypertension; Peripheral vascular disease ([place]);  and Sleep apnea. Family: family history is not on file.  Past Surgical History:  Procedure Laterality Date  . CAROTID ENDARTERECTOMY Bilateral    Dr. Delana Meyer, [facility]  . CARPAL TUNNEL RELEASE Left 12/23/2016   Procedure: CARPAL TUNNEL RELEASE ENDOSCOPIC;  Surgeon: Corky Mull, MD;  Location: [facility];  Service: Orthopedics;  Laterality: Left;  . CARPAL TUNNEL RELEASE Right 01/13/2017   Procedure: CARPAL TUNNEL RELEASE ENDOSCOPIC;  Surgeon: Corky Mull, MD;  Location: [facility];  Service: Orthopedics;  Laterality: Right;  . CORONARY ANGIOPLASTY    . EYE SURGERY Bilateral    Cataract Extraction with IOL  . FRACTURE SURGERY Right    Hip Pinning, Dr. Franchot Mimes, Jr  . JOINT REPLACEMENT    . KNEE ARTHROPLASTY Left 06/30/2016   Procedure: COMPUTER ASSISTED TOTAL KNEE ARTHROPLASTY;  Surgeon: Dereck Leep, MD;  Location: [facility];  Service: Orthopedics;  Laterality: Left;   Active Ambulatory Problems    Diagnosis Date Noted  . S/P total knee arthroplasty 06/30/2016  . Leg pain 10/19/2016  . Atherosclerosis of native arteries of extremity with intermittent claudication ([place]) 10/19/2016  . Carotid stenosis 10/19/2016  . DJD (degenerative joint disease) 10/19/2016  . HTN (hypertension) 10/19/2016  . Hyperlipidemia 10/19/2016   Resolved Ambulatory Problems    Diagnosis Date Noted  . No Resolved Ambulatory Problems   Past Medical History:  Diagnosis Date  . Arthritis   . Coronary artery disease   . Hyperlipidemia   . Hypertension   . Peripheral vascular disease ([place])   . Sleep apnea    Constitutional Exam  General appearance: Well nourished, well developed, and well hydrated. In no apparent acute distress Vitals:   05/25/17 1324  BP: (!) 163/84  Pulse: 73  Resp: 18  Temp: 98.8 F (37.1 C)  TempSrc: Oral  SpO2: 100%  Weight: 183 lb (83 kg)  Height: _0  (1.778 m)   BMI Assessment: Estimated body mass index is 26.26 kg/m as calculated from the following:   Height as  of this encounter: _1  (1.778 m).   Weight as of this encounter: 183 lb (83 kg).  BMI interpretation table: BMI level Category Range association with higher incidence of chronic pain  <18 kg/m2 Underweight   18.5-24.9 kg/m2 Ideal body weight   25-29.9 kg/m2 Overweight Increased incidence by 20%  30-34.9 kg/m2 Obese (Class I) Increased incidence by 68%  35-39.9 kg/m2 Severe obesity (Class II) Increased incidence by 136%  >40 kg/m2 Extreme obesity (Class III) Increased incidence by 254%   BMI Readings from Last 4 Encounters:  05/25/17 26.26 kg/m  01/06/17 26.08 kg/m  12/23/16 24.97 kg/m  12/22/16 24.97 kg/m   Wt Readings from Last 4 Encounters:  05/25/17 183 lb (83 kg)  01/06/17 187 lb (84.8 kg)  12/23/16 179 lb (81.2 kg)  12/22/16 179 lb (81.2 kg)  Psych/Mental status: Alert, oriented x 3 (person, place, & time)       Eyes: PERLA Respiratory: No evidence of acute respiratory distress Cervical spine: Tender to palpation overlying left cervical facets. Severely limited left lateral rotation. Limited right lateral rotation but better than left. Limited cervical extension Normal cervical flexion. Patient cervical collar Right upper extremity:numbness and paresthesias noted along right index middle and pinkie finger. Lumbar Spine Area Exam  Skin & Axial Inspection: No masses, redness, or swelling Alignment: Symmetrical Functional ROM: Unrestricted ROM      Stability: No instability detected Muscle Tone/Strength: Functionally intact. No obvious neuro-muscular anomalies detected. Sensory (Neurological): Unimpaired Palpation: No palpable anomalies       Provocative Tests: Lumbar Hyperextension and rotation test: evaluation deferred today       Lumbar Lateral bending test: evaluation deferred today       Patrick's Maneuver: evaluation deferred today                    Gait & Posture Assessment  Ambulation: Unassisted Gait: Relatively normal for age and body habitus Posture:  WNL   Lower Extremity Exam    Side: Right lower extremity  Side: Left lower extremity  Skin & Extremity Inspection: Skin color, temperature, and hair growth are WNL. No peripheral edema or cyanosis. No masses, redness, swelling, asymmetry, or associated skin lesions. No contractures.  Skin & Extremity Inspection: Skin color, temperature, and hair growth are WNL. No peripheral edema or cyanosis. No masses, redness, swelling, asymmetry, or associated skin lesions. No contractures.  Functional ROM: Unrestricted ROM          Functional ROM: Unrestricted ROM          Muscle Tone/Strength: Functionally intact. No obvious neuro-muscular anomalies detected.  Muscle Tone/Strength: Functionally intact. No obvious neuro-muscular anomalies detected.  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 Reviewed patient's cervical MRI. Discussed risks and benefits of bilateral C5/C6 and C6/C7 cervical facet injections. Patient in agreement with plan and would like to proceed.  -Bilateral C5/C6 and C6/C7 cervical facet injections without sedation. Patient must be off of Plavix for 5 days prior to procedure. He states that he has temporarily discontinued this medication in the past for his knee surgery after clearance from his cardiologist.  Orders Placed This Encounter  Procedures  . CERVICAL FACET (MEDIAL BRANCH NERVE BLOCK)     Standing Status:   Future    Standing Expiration Date:   06/25/2017    Scheduling Instructions:     Side: Bilateral     Level: C5, C6, & C7 Medial Branch Nerve     Sedation: No Sedation.     Timeframe: ASAP          Patient must be off Plavix for 5 days    Order Specific Question:   Where will this  procedure be performed?     Answer:   [facility]

## 2017-05-25 NOTE — Patient Instructions (Signed)
Pain Management Discharge Instructions  General Discharge Instructions :  If you need to reach your doctor call: Monday-Friday 8:00 am - 4:00 pm at [phone] or toll free [phone].  After clinic hours [phone] to have operator reach doctor.  Bring all of your medication bottles to all your appointments in the pain clinic.  To cancel or reschedule your appointment with Pain Management please remember to call 24 hours in advance to avoid a fee.  Refer to the educational materials which you have been given on: General Risks, I had my Procedure. Discharge Instructions, Post Sedation.  Post Procedure Instructions:  The drugs you were given will stay in your system until tomorrow, so for the next 24 hours you should not drive, make any legal decisions or drink any alcoholic beverages.  You may eat anything you prefer, but it is better to start with liquids then soups and crackers, and gradually work up to solid foods.  Please notify your doctor immediately if you have any unusual bleeding, trouble breathing or pain that is not related to your normal pain.  Depending on the type of procedure that was done, some parts of your body may feel week and/or numb.  This usually clears up by tonight or the next day.  Walk with the use of an assistive device or accompanied by an adult for the 24 hours.  You may use ice on the affected area for the first 24 hours.  Put ice in a Ziploc bag and cover with a towel and place against area 15 minutes on 15 minutes off.  You may switch to heat after 24 hours.GENERAL RISKS AND COMPLICATIONS  What are the risk, side effects and possible complications? Generally speaking, most procedures are safe.  However, with any procedure there are risks, side effects, and the possibility of complications.  The risks and complications are dependent upon the sites that are lesioned, or the type of nerve block to be performed.  The closer the procedure is to the spine,  the more serious the risks are.  Great care is taken when placing the radio frequency needles, block needles or lesioning probes, but sometimes complications can occur. 1. Infection: Any time there is an injection through the skin, there is a risk of infection.  This is why sterile conditions are used for these blocks.  There are four possible types of infection. 1. Localized skin infection. 2. Central Nervous System Infection-This can be in the form of Meningitis, which can be deadly. 3. Epidural Infections-This can be in the form of an epidural abscess, which can cause pressure inside of the spine, causing compression of the spinal cord with subsequent paralysis. This would require an emergency surgery to decompress, and there are no guarantees that the patient would recover from the paralysis. 4. Discitis-This is an infection of the intervertebral discs.  It occurs in about 1% of discography procedures.  It is difficult to treat and it may lead to surgery.        2. Pain: the needles have to go through skin and soft tissues, will cause soreness.       3. Damage to internal structures:  The nerves to be lesioned may be near blood vessels or    other nerves which can be potentially damaged.       4. Bleeding: Bleeding is more common if the patient is taking blood thinners such as  aspirin, Coumadin, Ticiid, Plavix, etc., or if he/she have some genetic predisposition  such as   hemophilia. Bleeding into the spinal canal can cause compression of the spinal  cord with subsequent paralysis.  This would require an emergency surgery to  decompress and there are no guarantees that the patient would recover from the  paralysis.       5. Pneumothorax:  Puncturing of a lung is a possibility, every time a needle is introduced in  the area of the chest or upper back.  Pneumothorax refers to free air around the  collapsed lung(s), inside of the thoracic cavity (chest cavity).  Another two possible  complications  related to a similar event would include: Hemothorax and Chylothorax.   These are variations of the Pneumothorax, where instead of air around the collapsed  lung(s), you may have blood or chyle, respectively.       6. Spinal headaches: They may occur with any procedures in the area of the spine.       7. Persistent CSF (Cerebro-Spinal Fluid) leakage: This is a rare problem, but may occur  with prolonged intrathecal or epidural catheters either due to the formation of a fistulous  track or a dural tear.       8. Nerve damage: By working so close to the spinal cord, there is always a possibility of  nerve damage, which could be as serious as a permanent spinal cord injury with  paralysis.       9. Death:  Although rare, severe deadly allergic reactions known as "Anaphylactic  reaction" can occur to any of the medications used.      10. Worsening of the symptoms:  We can always make thing worse.  What are the chances of something like this happening? Chances of any of this occuring are extremely low.  By statistics, you have more of a chance of getting killed in a motor vehicle accident: while driving to the hospital than any of the above occurring .  Nevertheless, you should be aware that they are possibilities.  In general, it is similar to taking a shower.  Everybody knows that you can slip, hit your head and get killed.  Does that mean that you should not shower again?  Nevertheless always keep in mind that statistics do not mean anything if you happen to be on the wrong side of them.  Even if a procedure has a 1 (one) in a 1,000,000 (million) chance of going wrong, it you happen to be that one..Also, keep in mind that by statistics, you have more of a chance of having something go wrong when taking medications.  Who should not have this procedure? If you are on a blood thinning medication (e.g. Coumadin, Plavix, see list of "Blood Thinners"), or if you have an active infection going on, you should not  have the procedure.  If you are taking any blood thinners, please inform your physician.  How should I prepare for this procedure?  Do not eat or drink anything at least six hours prior to the procedure.  Bring a driver with you .  It cannot be a taxi.  Come accompanied by an adult that can drive you back, and that is strong enough to help you if your legs get weak or numb from the local anesthetic.  Take all of your medicines the morning of the procedure with just enough water to swallow them.  If you have diabetes, make sure that you are scheduled to have your procedure done first thing in the morning, whenever possible.  If you have diabetes,   take only half of your insulin dose and notify our nurse that you have done so as soon as you arrive at the clinic.  If you are diabetic, but only take blood sugar pills (oral hypoglycemic), then do not take them on the morning of your procedure.  You may take them after you have had the procedure.  Do not take aspirin or any aspirin-containing medications, at least eleven (11) days prior to the procedure.  They may prolong bleeding.  Wear loose fitting clothing that may be easy to take off and that you would not mind if it got stained with Betadine or blood.  Do not wear any jewelry or perfume  Remove any nail coloring.  It will interfere with some of our monitoring equipment.  NOTE: Remember that this is not meant to be interpreted as a complete list of all possible complications.  Unforeseen problems may occur.  BLOOD THINNERS The following drugs contain aspirin or other products, which can cause increased bleeding during surgery and should not be taken for 2 weeks prior to and 1 week after surgery.  If you should need take something for relief of minor pain, you may take acetaminophen which is found in Tylenol,m Datril, Anacin-3 and Panadol. It is not blood thinner. The products listed below are.  Do not take any of the products listed below  in addition to any listed on your instruction sheet.  A.P.C or A.P.C with Codeine Codeine Phosphate Capsules #3 Ibuprofen Ridaura  ABC compound Congesprin Imuran rimadil  Advil Cope Indocin Robaxisal  Alka-Seltzer Effervescent Pain Reliever and Antacid Coricidin or Coricidin-D  Indomethacin Rufen  Alka-Seltzer plus Cold Medicine Cosprin Ketoprofen S-A-C Tablets  Anacin Analgesic Tablets or Capsules Coumadin Korlgesic Salflex  Anacin Extra Strength Analgesic tablets or capsules CP-2 Tablets Lanoril Salicylate  Anaprox Cuprimine Capsules Levenox Salocol  Anexsia-D Dalteparin Magan Salsalate  Anodynos Darvon compound Magnesium Salicylate Sine-off  Ansaid Dasin Capsules Magsal Sodium Salicylate  Anturane Depen Capsules Marnal Soma  APF Arthritis pain formula Dewitt's Pills Measurin Stanback  Argesic Dia-Gesic Meclofenamic Sulfinpyrazone  Arthritis Bayer Timed Release Aspirin Diclofenac Meclomen Sulindac  Arthritis pain formula Anacin Dicumarol Medipren Supac  Analgesic (Safety coated) Arthralgen Diffunasal Mefanamic Suprofen  Arthritis Strength Bufferin Dihydrocodeine Mepro Compound Suprol  Arthropan liquid Dopirydamole Methcarbomol with Aspirin Synalgos  ASA tablets/Enseals Disalcid Micrainin Tagament  Ascriptin Doan's Midol Talwin  Ascriptin A/D Dolene Mobidin Tanderil  Ascriptin Extra Strength Dolobid Moblgesic Ticlid  Ascriptin with Codeine Doloprin or Doloprin with Codeine Momentum Tolectin  Asperbuf Duoprin Mono-gesic Trendar  Aspergum Duradyne Motrin or Motrin IB Triminicin  Aspirin plain, buffered or enteric coated Durasal Myochrisine Trigesic  Aspirin Suppositories Easprin Nalfon Trillsate  Aspirin with Codeine Ecotrin Regular or Extra Strength Naprosyn Uracel  Atromid-S Efficin Naproxen Ursinus  Auranofin Capsules Elmiron Neocylate Vanquish  Axotal Emagrin Norgesic Verin  Azathioprine Empirin or Empirin with Codeine Normiflo Vitamin E  Azolid Emprazil Nuprin Voltaren  Bayer  Aspirin plain, buffered or children's or timed BC Tablets or powders Encaprin Orgaran Warfarin Sodium  Buff-a-Comp Enoxaparin Orudis Zorpin  Buff-a-Comp with Codeine Equegesic Os-Cal-Gesic   Buffaprin Excedrin plain, buffered or Extra Strength Oxalid   Bufferin Arthritis Strength Feldene Oxphenbutazone   Bufferin plain or Extra Strength Feldene Capsules Oxycodone with Aspirin   Bufferin with Codeine Fenoprofen Fenoprofen Pabalate or Pabalate-SF   Buffets II Flogesic Panagesic   Buffinol plain or Extra Strength Florinal or Florinal with Codeine Panwarfarin   Buf-Tabs Flurbiprofen Penicillamine   Butalbital Compound Four-way cold tablets   Penicillin   Butazolidin Fragmin Pepto-Bismol   Carbenicillin Geminisyn Percodan   Carna Arthritis Reliever Geopen Persantine   Carprofen Gold's salt Persistin   Chloramphenicol Goody's Phenylbutazone   Chloromycetin Haltrain Piroxlcam   Clmetidine heparin Plaquenil   Cllnoril Hyco-pap Ponstel   Clofibrate Hydroxy chloroquine Propoxyphen         Before stopping any of these medications, be sure to consult the physician who ordered them.  Some, such as Coumadin (Warfarin) are ordered to prevent or treat serious conditions such as "deep thrombosis", "pumonary embolisms", and other heart problems.  The amount of time that you may need off of the medication may also vary with the medication and the reason for which you were taking it.  If you are taking any of these medications, please make sure you notify your pain physician before you undergo any procedures.         Facet Joint Block The facet joints connect the bones of the spine (vertebrae). They make it possible for you to bend, twist, and make other movements with your spine. They also keep you from bending too far, twisting too far, and making other excessive movements. A facet joint block is a procedure where a numbing medicine (anesthetic) is injected into a facet joint. Often, a type of  anti-inflammatory medicine called a steroid is also injected. A facet joint block may be done to diagnose neck or back pain. If the pain gets better after a facet joint block, it means the pain is probably coming from the facet joint. If the pain does not get better, it means the pain is probably not coming from the facet joint. A facet joint block may also be done to relieve neck or back pain caused by an inflamed facet joint. A facet joint block is only done to relieve pain if the pain does not improve with other methods, such as medicine, exercise programs, and physical therapy. Tell a health care provider about:  Any allergies you have.  All medicines you are taking, including vitamins, herbs, eye drops, creams, and over-the-counter medicines.  Any problems you or family members have had with anesthetic medicines.  Any blood disorders you have.  Any surgeries you have had.  Any medical conditions you have.  Whether you are pregnant or may be pregnant. What are the risks? Generally, this is a safe procedure. However, problems may occur, including:  Bleeding.  Injury to a nerve near the injection site.  Pain at the injection site.  Weakness or numbness in areas controlled by nerves near the injection site.  Infection.  Temporary fluid retention.  Allergic reactions to medicines or dyes.  Injury to other structures or organs near the injection site.  What happens before the procedure?  Follow instructions from your health care provider about eating or drinking restrictions.  Ask your health care provider about: ? Changing or stopping your regular medicines. This is especially important if you are taking diabetes medicines or blood thinners. ? Taking medicines such as aspirin and ibuprofen. These medicines can thin your blood. Do not take these medicines before your procedure if your health care provider instructs you not to.  Do not take any new dietary supplements or  medicines without asking your health care provider first.  Plan to have someone take you home after the procedure. What happens during the procedure?  You may need to remove your clothing and dress in an open-back gown.  The procedure will be done while you are lying  on an X-ray table. You will most likely be asked to lie on your stomach, but you may be asked to lie in a different position if an injection will be made in your neck.  Machines will be used to monitor your oxygen levels, heart rate, and blood pressure.  If an injection will be made in your neck, an IV tube will be inserted into one of your veins. Fluids and medicine will flow directly into your body through the IV tube.  The area over the facet joint where the injection will be made will be cleaned with soap. The surrounding skin will be covered with clean drapes.  A numbing medicine (local anesthetic) will be applied to your skin. Your skin may sting or burn for a moment.  A video X-ray machine (fluoroscopy) will be used to locate the joint. In some cases, a CT scan may be used.  A contrast dye may be injected into the facet joint area to help locate the joint.  When the joint is located, an anesthetic will be injected into the joint through the needle.  Your health care provider will ask you whether you feel pain relief. If you do feel relief, a steroid may be injected to provide pain relief for a longer period of time. If you do not feel relief or feel only partial relief, additional injections of an anesthetic may be made in other facet joints.  The needle will be removed.  Your skin will be cleaned.  A bandage (dressing) will be applied over each injection site. The procedure may vary among health care providers and hospitals. What happens after the procedure?  You will be observed for 15-30 minutes before being allowed to go home. This information is not intended to replace advice given to you by your health care  provider. Make sure you discuss any questions you have with your health care provider. Document Released: 12/22/2006 Document Revised: 09/03/2015 Document Reviewed: 04/28/2015 [employer] Interactive Patient Education  [employer].

## 2017-06-13 ENCOUNTER — Ambulatory Visit
Admission: RE | Admit: 2017-06-13 | Discharge: 2017-06-13 | Disposition: A | Discharge status: Home / Self Care | Payer: PPO | Source: Ambulatory Visit | Attending: Student in an Organized Health Care Education/Training Program | Admitting: Student in an Organized Health Care Education/Training Program

## 2017-06-13 ENCOUNTER — Encounter: Payer: Self-pay | Admitting: Student in an Organized Health Care Education/Training Program

## 2017-06-13 ENCOUNTER — Ambulatory Visit (HOSPITAL_BASED_OUTPATIENT_CLINIC_OR_DEPARTMENT_OTHER): Payer: PPO | Admitting: Student in an Organized Health Care Education/Training Program

## 2017-06-13 VITALS — BP 136/61 | HR 59 | Temp 97.6°F | Resp 16 | Ht 70.0 in | Wt 181.0 lb

## 2017-06-13 DIAGNOSIS — M47812 Spondylosis without myelopathy or radiculopathy, cervical region: Secondary | ICD-10-CM | POA: Insufficient documentation

## 2017-06-13 DIAGNOSIS — Z96652 Presence of left artificial knee joint: Secondary | ICD-10-CM | POA: Diagnosis not present

## 2017-06-13 DIAGNOSIS — M542 Cervicalgia: Secondary | ICD-10-CM | POA: Insufficient documentation

## 2017-06-13 DIAGNOSIS — Z9889 Other specified postprocedural states: Secondary | ICD-10-CM | POA: Diagnosis not present

## 2017-06-13 DIAGNOSIS — M5080 Other cervical disc disorders, unspecified cervical region: Secondary | ICD-10-CM | POA: Diagnosis not present

## 2017-06-13 DIAGNOSIS — Z7902 Long term (current) use of antithrombotics/antiplatelets: Secondary | ICD-10-CM | POA: Insufficient documentation

## 2017-06-13 MED ORDER — FENTANYL CITRATE (PF) 100 MCG/2ML IJ SOLN
25.0000 ug | INTRAMUSCULAR | Status: DC | PRN
  Administered 2017-06-13: 50 ug via INTRAVENOUS
  Filled 2017-06-13: qty 2

## 2017-06-13 MED ORDER — DEXAMETHASONE SODIUM PHOSPHATE 10 MG/ML IJ SOLN
10.0000 mg | Freq: Once | INTRAMUSCULAR | Status: AC
  Administered 2017-06-13: 10 mg

## 2017-06-13 MED ORDER — LACTATED RINGERS IV SOLN
1000.0000 mL | Freq: Once | INTRAVENOUS | Status: AC
  Administered 2017-06-13: 1000 mL via INTRAVENOUS

## 2017-06-13 MED ORDER — DEXAMETHASONE SODIUM PHOSPHATE 10 MG/ML IJ SOLN
INTRAMUSCULAR | Status: AC
  Filled 2017-06-13: qty 1

## 2017-06-13 MED ORDER — ROPIVACAINE HCL 2 MG/ML IJ SOLN
10.0000 mL | Freq: Once | INTRAMUSCULAR | Status: AC
  Administered 2017-06-13: 10 mL
  Filled 2017-06-13: qty 10

## 2017-06-13 MED ORDER — LIDOCAINE HCL (PF) 1 % IJ SOLN
10.0000 mL | Freq: Once | INTRAMUSCULAR | Status: AC
  Administered 2017-06-13: 5 mL
  Filled 2017-06-13: qty 10

## 2017-06-13 NOTE — Progress Notes (Signed)
Safety precautions to be maintained throughout the outpatient stay will include: orient to surroundings, keep bed in low position, maintain call bell within reach at all times, provide assistance with transfer out of bed and ambulation.  

## 2017-06-13 NOTE — Patient Instructions (Addendum)
-Ok to restart Plavix tomorrow afternoon -Follow up 2 weeks for post-procedural evaluation -  Pain Management Discharge Instructions  General Discharge Instructions :  If you need to reach your doctor call: Monday-Friday 8:00 am - 4:00 pm at [phone] or toll free [phone].  After clinic hours [phone] to have operator reach doctor.  Bring all of your medication bottles to all your appointments in the pain clinic.  To cancel or reschedule your appointment with Pain Management please remember to call 24 hours in advance to avoid a fee.  Refer to the educational materials which you have been given on: General Risks, I had my Procedure. Discharge Instructions, Post Sedation.  Post Procedure Instructions:  The drugs you were given will stay in your system until tomorrow, so for the next 24 hours you should not drive, make any legal decisions or drink any alcoholic beverages.  You may eat anything you prefer, but it is better to start with liquids then soups and crackers, and gradually work up to solid foods.  Please notify your doctor immediately if you have any unusual bleeding, trouble breathing or pain that is not related to your normal pain.  Depending on the type of procedure that was done, some parts of your body may feel week and/or numb.  This usually clears up by tonight or the next day.  Walk with the use of an assistive device or accompanied by an adult for the 24 hours.  You may use ice on the affected area for the first 24 hours.  Put ice in a Ziploc bag and cover with a towel and place against area 15 minutes on 15 minutes off.  You may switch to heat after 24 hours.GENERAL RISKS AND COMPLICATIONS  What are the risk, side effects and possible complications? Generally speaking, most procedures are safe.  However, with any procedure there are risks, side effects, and the possibility of complications.  The risks and complications are dependent upon the sites that  are lesioned, or the type of nerve block to be performed.  The closer the procedure is to the spine, the more serious the risks are.  Great care is taken when placing the radio frequency needles, block needles or lesioning probes, but sometimes complications can occur. 1. Infection: Any time there is an injection through the skin, there is a risk of infection.  This is why sterile conditions are used for these blocks.  There are four possible types of infection. 1. Localized skin infection. 2. Central Nervous System Infection-This can be in the form of Meningitis, which can be deadly. 3. Epidural Infections-This can be in the form of an epidural abscess, which can cause pressure inside of the spine, causing compression of the spinal cord with subsequent paralysis. This would require an emergency surgery to decompress, and there are no guarantees that the patient would recover from the paralysis. 4. Discitis-This is an infection of the intervertebral discs.  It occurs in about 1% of discography procedures.  It is difficult to treat and it may lead to surgery.        2. Pain: the needles have to go through skin and soft tissues, will cause soreness.       3. Damage to internal structures:  The nerves to be lesioned may be near blood vessels or    other nerves which can be potentially damaged.       4. Bleeding: Bleeding is more common if the patient is taking blood thinners such as  aspirin, Coumadin, Ticiid, Plavix, etc., or if he/she have some genetic predisposition  such as hemophilia. Bleeding into the spinal canal can cause compression of the spinal  cord with subsequent paralysis.  This would require an emergency surgery to  decompress and there are no guarantees that the patient would recover from the  paralysis.       5. Pneumothorax:  Puncturing of a lung is a possibility, every time a needle is introduced in  the area of the chest or upper back.  Pneumothorax refers to free air around the   collapsed lung(s), inside of the thoracic cavity (chest cavity).  Another two possible  complications related to a similar event would include: Hemothorax and Chylothorax.   These are variations of the Pneumothorax, where instead of air around the collapsed  lung(s), you may have blood or chyle, respectively.       6. Spinal headaches: They may occur with any procedures in the area of the spine.       7. Persistent CSF (Cerebro-Spinal Fluid) leakage: This is a rare problem, but may occur  with prolonged intrathecal or epidural catheters either due to the formation of a fistulous  track or a dural tear.       8. Nerve damage: By working so close to the spinal cord, there is always a possibility of  nerve damage, which could be as serious as a permanent spinal cord injury with  paralysis.       9. Death:  Although rare, severe deadly allergic reactions known as "Anaphylactic  reaction" can occur to any of the medications used.      10. Worsening of the symptoms:  We can always make thing worse.  What are the chances of something like this happening? Chances of any of this occuring are extremely low.  By statistics, you have more of a chance of getting killed in a motor vehicle accident: while driving to the hospital than any of the above occurring .  Nevertheless, you should be aware that they are possibilities.  In general, it is similar to taking a shower.  Everybody knows that you can slip, hit your head and get killed.  Does that mean that you should not shower again?  Nevertheless always keep in mind that statistics do not mean anything if you happen to be on the wrong side of them.  Even if a procedure has a 1 (one) in a 1,000,000 (million) chance of going wrong, it you happen to be that one..Also, keep in mind that by statistics, you have more of a chance of having something go wrong when taking medications.  Who should not have this procedure? If you are on a blood thinning medication (e.g.  Coumadin, Plavix, see list of "Blood Thinners"), or if you have an active infection going on, you should not have the procedure.  If you are taking any blood thinners, please inform your physician.  How should I prepare for this procedure?  Do not eat or drink anything at least six hours prior to the procedure.  Bring a driver with you .  It cannot be a taxi.  Come accompanied by an adult that can drive you back, and that is strong enough to help you if your legs get weak or numb from the local anesthetic.  Take all of your medicines the morning of the procedure with just enough water to swallow them.  If you have diabetes, make sure that you are scheduled to have  your procedure done first thing in the morning, whenever possible.  If you have diabetes, take only half of your insulin dose and notify our nurse that you have done so as soon as you arrive at the clinic.  If you are diabetic, but only take blood sugar pills (oral hypoglycemic), then do not take them on the morning of your procedure.  You may take them after you have had the procedure.  Do not take aspirin or any aspirin-containing medications, at least eleven (11) days prior to the procedure.  They may prolong bleeding.  Wear loose fitting clothing that may be easy to take off and that you would not mind if it got stained with Betadine or blood.  Do not wear any jewelry or perfume  Remove any nail coloring.  It will interfere with some of our monitoring equipment.  NOTE: Remember that this is not meant to be interpreted as a complete list of all possible complications.  Unforeseen problems may occur.  BLOOD THINNERS The following drugs contain aspirin or other products, which can cause increased bleeding during surgery and should not be taken for 2 weeks prior to and 1 week after surgery.  If you should need take something for relief of minor pain, you may take acetaminophen which is found in Tylenol,m Datril, Anacin-3 and  Panadol. It is not blood thinner. The products listed below are.  Do not take any of the products listed below in addition to any listed on your instruction sheet.  A.P.C or A.P.C with Codeine Codeine Phosphate Capsules #3 Ibuprofen Ridaura  ABC compound Congesprin Imuran rimadil  Advil Cope Indocin Robaxisal  Alka-Seltzer Effervescent Pain Reliever and Antacid Coricidin or Coricidin-D  Indomethacin Rufen  Alka-Seltzer plus Cold Medicine Cosprin Ketoprofen S-A-C Tablets  Anacin Analgesic Tablets or Capsules Coumadin Korlgesic Salflex  Anacin Extra Strength Analgesic tablets or capsules CP-2 Tablets Lanoril Salicylate  Anaprox Cuprimine Capsules Levenox Salocol  Anexsia-D Dalteparin Magan Salsalate  Anodynos Darvon compound Magnesium Salicylate Sine-off  Ansaid Dasin Capsules Magsal Sodium Salicylate  Anturane Depen Capsules Marnal Soma  APF Arthritis pain formula Dewitt's Pills Measurin Stanback  Argesic Dia-Gesic Meclofenamic Sulfinpyrazone  Arthritis Bayer Timed Release Aspirin Diclofenac Meclomen Sulindac  Arthritis pain formula Anacin Dicumarol Medipren Supac  Analgesic (Safety coated) Arthralgen Diffunasal Mefanamic Suprofen  Arthritis Strength Bufferin Dihydrocodeine Mepro Compound Suprol  Arthropan liquid Dopirydamole Methcarbomol with Aspirin Synalgos  ASA tablets/Enseals Disalcid Micrainin Tagament  Ascriptin Doan's Midol Talwin  Ascriptin A/D Dolene Mobidin Tanderil  Ascriptin Extra Strength Dolobid Moblgesic Ticlid  Ascriptin with Codeine Doloprin or Doloprin with Codeine Momentum Tolectin  Asperbuf Duoprin Mono-gesic Trendar  Aspergum Duradyne Motrin or Motrin IB Triminicin  Aspirin plain, buffered or enteric coated Durasal Myochrisine Trigesic  Aspirin Suppositories Easprin Nalfon Trillsate  Aspirin with Codeine Ecotrin Regular or Extra Strength Naprosyn Uracel  Atromid-S Efficin Naproxen Ursinus  Auranofin Capsules Elmiron Neocylate Vanquish  Axotal Emagrin Norgesic  Verin  Azathioprine Empirin or Empirin with Codeine Normiflo Vitamin E  Azolid Emprazil Nuprin Voltaren  Bayer Aspirin plain, buffered or children's or timed BC Tablets or powders Encaprin Orgaran Warfarin Sodium  Buff-a-Comp Enoxaparin Orudis Zorpin  Buff-a-Comp with Codeine Equegesic Os-Cal-Gesic   Buffaprin Excedrin plain, buffered or Extra Strength Oxalid   Bufferin Arthritis Strength Feldene Oxphenbutazone   Bufferin plain or Extra Strength Feldene Capsules Oxycodone with Aspirin   Bufferin with Codeine Fenoprofen Fenoprofen Pabalate or Pabalate-SF   Buffets II Flogesic Panagesic   Buffinol plain or Extra Strength Florinal or Florinal  with Codeine Panwarfarin   Buf-Tabs Flurbiprofen Penicillamine   Butalbital Compound Four-way cold tablets Penicillin   Butazolidin Fragmin Pepto-Bismol   Carbenicillin Geminisyn Percodan   Carna Arthritis Reliever Geopen Persantine   Carprofen Gold's salt Persistin   Chloramphenicol Goody's Phenylbutazone   Chloromycetin Haltrain Piroxlcam   Clmetidine heparin Plaquenil   Cllnoril Hyco-pap Ponstel   Clofibrate Hydroxy chloroquine Propoxyphen         Before stopping any of these medications, be sure to consult the physician who ordered them.  Some, such as Coumadin (Warfarin) are ordered to prevent or treat serious conditions such as "deep thrombosis", "pumonary embolisms", and other heart problems.  The amount of time that you may need off of the medication may also vary with the medication and the reason for which you were taking it.  If you are taking any of these medications, please make sure you notify your pain physician before you undergo any procedures.         Facet Blocks Patient Information  Description: The facets are joints in the spine between the vertebrae.  Like any joints in the body, facets can become irritated and painful.  Arthritis can also effect the facets.  By injecting steroids and local anesthetic in and around these  joints, we can temporarily block the nerve supply to them.  Steroids act directly on irritated nerves and tissues to reduce selling and inflammation which often leads to decreased pain.  Facet blocks may be done anywhere along the spine from the neck to the low back depending upon the location of your pain.   After numbing the skin with local anesthetic (like Novocaine), a small needle is passed onto the facet joints under x-ray guidance.  You may experience a sensation of pressure while this is being done.  The entire block usually lasts about 15-25 minutes.   Conditions which may be treated by facet blocks:   Low back/buttock pain  Neck/shoulder pain  Certain types of headaches  Preparation for the injection:  1. Do not eat any solid food or dairy products within 8 hours of your appointment. 2. You may drink clear liquid up to 3 hours before appointment.  Clear liquids include water, black coffee, juice or soda.  No milk or cream please. 3. You may take your regular medication, including pain medications, with a sip of water before your appointment.  Diabetics should hold regular insulin (if taken separately) and take 1/2 normal NPH dose the morning of the procedure.  Carry some sugar containing items with you to your appointment. 4. A driver must accompany you and be prepared to drive you home after your procedure. 5. Bring all your current medications with you. 6. An IV may be inserted and sedation may be given at the discretion of the physician. 7. A blood pressure cuff, EKG and other monitors will often be applied during the procedure.  Some patients may need to have extra oxygen administered for a short period. 8. You will be asked to provide medical information, including your allergies and medications, prior to the procedure.  We must know immediately if you are taking blood thinners (like Coumadin/Warfarin) or if you are allergic to IV iodine contrast (dye).  We must know if you could  possible be pregnant.  Possible side-effects:   Bleeding from needle site  Infection (rare, may require surgery)  Nerve injury (rare)  Numbness & tingling (temporary)  Difficulty urinating (rare, temporary)  Spinal headache (a headache worse with upright posture)  Light-headedness (temporary)  Pain at injection site (serveral days)  Decreased blood pressure (rare, temporary)  Weakness in arm/leg (temporary)  Pressure sensation in back/neck (temporary)   Call if you experience:   Fever/chills associated with headache or increased back/neck pain  Headache worsened by an upright position  New onset, weakness or numbness of an extremity below the injection site  Hives or difficulty breathing (go to the emergency room)  Inflammation or drainage at the injection site(s)  Severe back/neck pain greater than usual  New symptoms which are concerning to you  Please note:  Although the local anesthetic injected can often make your back or neck feel good for several hours after the injection, the pain will likely return. It takes 3-7 days for steroids to work.  You may not notice any pain relief for at least one week.  If effective, we will often do a series of 2-3 injections spaced 3-6 weeks apart to maximally decrease your pain.  After the initial series, you may be a candidate for a more permanent nerve block of the facets.  If you have any questions, please call #[phone] [facility]

## 2017-06-13 NOTE — Progress Notes (Signed)
Patient's Name: Ivan Reyes  MRN: [id number]  Referring Provider: Gillis Santa, MD  DOB: 07-31-1946  PCP: Madelyn Brunner, MD  DOS: 06/13/2017  Note by: Gillis Santa, MD  Service setting: Ambulatory outpatient  Specialty: Interventional Pain Management  Patient type: Established  Location: [facility] (AMB) Pain Management Facility  Visit type: Interventional Procedure   Primary Reason for Visit: Interventional Pain Management Treatment. CC: Neck Pain (bilateral)  Procedure:  Anesthesia, Analgesia, Anxiolysis:  Type: Diagnostic Cervical Facet Medial Branch Block(s) Region: Posterolateral cervical spine region Level: C5, C6, & C7 Medial Branch Level(s) Laterality: Bilateral Paraspinal  Patient stopped Plavix 5 days ago.   Type: Local Anesthesia with Moderate (Conscious) Sedation Local Anesthetic: Lidocaine 1% Route: Intravenous (IV) IV Access: Secured Sedation: Meaningful verbal contact was maintained at all times during the procedure  Indication(s): Analgesia and Anxiety   Indications: 1. Cervical facet joint syndrome   2. Cervical spondylosis without myelopathy   3. Cervicalgia    Pain Score: Pre-procedure: 1 /10 Post-procedure: 0-No pain/10  Pre-op Assessment:  Ivan Reyes is a 71 y.o. (year old), male patient, seen today for interventional treatment. He  has a past surgical history that includes Carotid endarterectomy (Bilateral); Coronary angioplasty; Eye surgery (Bilateral); Fracture surgery (Right); Knee Arthroplasty (Left, 06/30/2016); Joint replacement; Carpal tunnel release (Left, 12/23/2016); and Carpal tunnel release (Right, 01/13/2017). [name] has a current medication list which includes the following prescription(s): vitamin d, clopidogrel, enalapril, fenofibrate micronized, glucosamine hcl, hydrochlorothiazide, ibuprofen, omega-3 fish oil, sildenafil, simvastatin, vitamin b-12, vitamin e, gabapentin, and oxycodone, and the following Facility-Administered  Medications: fentanyl. His primarily concern today is the Neck Pain (bilateral)  Initial Vital Signs: Blood pressure 125/62, pulse (!) 59, temperature 98.2 F (36.8 C), temperature source Oral, resp. rate 16, height 5\' 10"  (1.778 m), weight 181 lb (82.1 kg), SpO2 97 %. BMI: Estimated body mass index is 25.97 kg/m as calculated from the following:   Height as of this encounter: 5\' 10"  (1.778 m).   Weight as of this encounter: 181 lb (82.1 kg).  Risk Assessment: Allergies: Reviewed. He has No Known Allergies.  Allergy Precautions: None required Coagulopathies: Reviewed. None identified.  Blood-thinner therapy: None at this time Active Infection(s): Reviewed. None identified. Ivan Reyes is afebrile  Site Confirmation: Ivan Reyes was asked to confirm the procedure and laterality before marking the site Procedure checklist: Completed Consent: Before the procedure and under the influence of no sedative(s), amnesic(s), or anxiolytics, the patient was informed of the treatment options, risks and possible complications. To fulfill our ethical and legal obligations, as recommended by the American Medical Association's Code of Ethics, I have informed the patient of my clinical impression; the nature and purpose of the treatment or procedure; the risks, benefits, and possible complications of the intervention; the alternatives, including doing nothing; the risk(s) and benefit(s) of the alternative treatment(s) or procedure(s); and the risk(s) and benefit(s) of doing nothing. The patient was provided information about the general risks and possible complications associated with the procedure. These may include, but are not limited to: failure to achieve desired goals, infection, bleeding, organ or nerve damage, allergic reactions, paralysis, and death. In addition, the patient was informed of those risks and complications associated to Spine-related procedures, such as failure to decrease pain; infection  (i.e.: Meningitis, epidural or intraspinal abscess); bleeding (i.e.: epidural hematoma, subarachnoid hemorrhage, or any other type of intraspinal or peri-dural bleeding); organ or nerve damage (i.e.: Any type of peripheral nerve, nerve root, or spinal cord injury) with  subsequent damage to sensory, motor, and/or autonomic systems, resulting in permanent pain, numbness, and/or weakness of one or several areas of the body; allergic reactions; (i.e.: anaphylactic reaction); and/or death. Furthermore, the patient was informed of those risks and complications associated with the medications. These include, but are not limited to: allergic reactions (i.e.: anaphylactic or anaphylactoid reaction(s)); adrenal axis suppression; blood sugar elevation that in diabetics may result in ketoacidosis or comma; water retention that in patients with history of congestive heart failure may result in shortness of breath, pulmonary edema, and decompensation with resultant heart failure; weight gain; swelling or edema; medication-induced neural toxicity; particulate matter embolism and blood vessel occlusion with resultant organ, and/or nervous system infarction; and/or aseptic necrosis of one or more joints. Finally, the patient was informed that Medicine is not an exact science; therefore, there is also the possibility of unforeseen or unpredictable risks and/or possible complications that may result in a catastrophic outcome. The patient indicated having understood very clearly. We have given the patient no guarantees and we have made no promises. Enough time was given to the patient to ask questions, all of which were answered to the patient's satisfaction. Ivan Reyes has indicated that he wanted to continue with the procedure. Attestation: I, the ordering provider, attest that I have discussed with the patient the benefits, risks, side-effects, alternatives, likelihood of achieving goals, and potential problems during recovery  for the procedure that I have provided informed consent. Date: 06/13/2017; Time: 9:57 AM  Pre-Procedure Preparation:  Monitoring: As per clinic protocol. Respiration, ETCO2, SpO2, BP, heart rate and rhythm monitor placed and checked for adequate function Safety Precautions: Patient was assessed for positional comfort and pressure points before starting the procedure. Time-out: I initiated and conducted the "Time-out" before starting the procedure, as per protocol. The patient was asked to participate by confirming the accuracy of the "Time Out" information. Verification of the correct person, site, and procedure were performed and confirmed by me, the nursing staff, and the patient. "Time-out" conducted as per Joint Commission's Universal Protocol (UP.01.01.01). "Time-out" Date & Time: 06/13/2017; 1035 hrs.  Description of Procedure Process:   Position: Prone with head of the table was raised to facilitate breathing. Target Area: For Cervical Facet blocks, the target is the postero-lateral waist of the articular pillars at the C3, C4, C5, C6, & C7 levels. Approach: Posterior approach. Area Prepped: Entire Posterior Cervico-thoracic Region Prepping solution: ChloraPrep (2% chlorhexidine gluconate and 70% isopropyl alcohol) Safety Precautions: Aspiration looking for blood return was conducted prior to all injections. At no point did we inject any substances, as a needle was being advanced. No attempts were made at seeking any paresthesias. Safe injection practices and needle disposal techniques used. Medications properly checked for expiration dates. SDV (single dose vial) medications used. Description of the Procedure: Protocol guidelines were followed. The patient was placed in position over the fluoroscopy table. The target area was identified and the area prepped in the usual manner. Skin desensitized using vapocoolant spray. Skin & deeper tissues infiltrated with local anesthetic. Appropriate  amount of time allowed to pass for local anesthetics to take effect. The procedure needle was introduced through the skin, ipsilateral to the reported pain, and advanced to the target area. Bone was contacted on the posterior aspect of the articular pillars and the needle walked lateral, until the border was cleared. Lateral views taken to make sure the needle tip did not advance past the posterior third of the lateral mass of the posterior columns. The procedure  was repeated in identical fashion for each level. Negative aspiration confirmed. Solution injected in intermittent fashion, asking for systemic symptoms every 0.5cc of injectate. The needles were then removed and the area cleansed, making sure to leave some of the prepping solution back to take advantage of its long term bactericidal properties. Vitals:   06/13/17 1100 06/13/17 1110 06/13/17 1120 06/13/17 1130  BP: 126/72 129/64 (!) 147/59 136/61  Pulse: (!) 58 (!) 56 (!) 52 (!) 59  Resp: 18 16 16 16   Temp: 97.6 F (36.4 C)   97.6 F (36.4 C)  TempSrc:      SpO2: 98% 97% 100% 100%  Weight:      Height:        Start Time: 1035 hrs. End Time: 1051 hrs. Materials:  Needle(s) Type: Regular needle Gauge: 22G Length: 3.5-in Medication(s): We administered fentaNYL, lidocaine (PF), ropivacaine (PF) 2 mg/mL (0.2%), lactated ringers, and dexamethasone. Please see chart orders for dosing details. 6 cc solution made of 5 cc of 0.2% ropivacaine and 1 cc of Decadron 10 kg/cc. 1 cc injected at each level. Imaging Guidance (Spinal):  Type of Imaging Technique: Fluoroscopy Guidance (Spinal) Indication(s): Assistance in needle guidance and placement for procedures requiring needle placement in or near specific anatomical locations not easily accessible without such assistance. Exposure Time: Please see nurses notes. Contrast: None used. Fluoroscopic Guidance: I was personally present during the use of fluoroscopy. "Tunnel Vision Technique" used to  obtain the best possible view of the target area. Parallax error corrected before commencing the procedure. "Direction-depth-direction" technique used to introduce the needle under continuous pulsed fluoroscopy. Once target was reached, antero-posterior, oblique, and lateral fluoroscopic projection used confirm needle placement in all planes. Images permanently stored in EMR. Interpretation: No contrast injected. I personally interpreted the imaging intraoperatively. Adequate needle placement confirmed in multiple planes. Permanent images saved into the patient's record.  Antibiotic Prophylaxis:  Indication(s): None identified Antibiotic given: None  Post-operative Assessment:  EBL: None Complications: No immediate post-treatment complications observed by team, or reported by patient. Note: The patient tolerated the entire procedure well. A repeat set of vitals were taken after the procedure and the patient was kept under observation following institutional policy, for this type of procedure. Post-procedural neurological assessment was performed, showing return to baseline, prior to discharge. The patient was provided with post-procedure discharge instructions, including a section on how to identify potential problems. Should any problems arise concerning this procedure, the patient was given instructions to immediately contact us, at any time, without hesitation. In any case, we plan to contact the patient by telephone for a follow-up status report regarding this interventional procedure. Comments:  No additional relevant information. 5 out of 5 strength bilateral upper extremity: Shoulder abduction, elbow flexion, elbow extension, thumb extension.  Plan of Care  Patient started to resume Plavix tomorrow afternoon. Follow-up in 2 weeks for postprocedure evaluation.  Imaging Orders     DG C-Arm 1-60 Min-No Report Procedure Orders    No procedure(s) ordered today    Medications ordered for  procedure: Meds ordered this encounter  Medications  . fentaNYL (SUBLIMAZE) injection 25-50 mcg    Make sure Narcan is available in the pyxis when using this medication. In the event of respiratory depression (RR< 8/min): Titrate NARCAN (naloxone) in increments of 0.1 to 0.2 mg IV at 2-3 minute intervals, until desired degree of reversal.  . lidocaine (PF) (XYLOCAINE) 1 % injection 10 mL  . ropivacaine (PF) 2 mg/mL (0.2%) (NAROPIN) injection 10 mL  .  lactated ringers infusion 1,000 mL  . dexamethasone (DECADRON) injection 10 mg   Medications administered: We administered fentaNYL, lidocaine (PF), ropivacaine (PF) 2 mg/mL (0.2%), lactated ringers, and dexamethasone.  See the medical record for exact dosing, route, and time of administration.  New Prescriptions   No medications on file   Disposition: Discharge home  Discharge Date & Time: 06/13/2017; 1135 hrs.   Physician-requested Follow-up: Return in about 2 weeks (around 06/27/2017) for Post Procedure Evaluation. Future Appointments Date Time Provider [place]  06/28/2017 9:30 AM Gillis Santa, MD [facility] None   Primary Care Physician: Madelyn Brunner, MD Location: [facility] Note by: Gillis Santa, MD Date: 06/13/2017; Time: 2:07 PM  Disclaimer:  Medicine is not an exact science. The only guarantee in medicine is that nothing is guaranteed. It is important to note that the decision to proceed with this intervention was based on the information collected from the patient. The Data and conclusions were drawn from the patient's questionnaire, the interview, and the physical examination. Because the information was provided in large part by the patient, it cannot be guaranteed that it has not been purposely or unconsciously manipulated. Every effort has been made to obtain as much relevant data as possible for this evaluation. It is important to note that the conclusions that lead to this  procedure are derived in large part from the available data. Always take into account that the treatment will also be dependent on availability of resources and existing treatment guidelines, considered by other Pain Management Practitioners as being common knowledge and practice, at the time of the intervention. For Medico-Legal purposes, it is also important to point out that variation in procedural techniques and pharmacological choices are the acceptable norm. The indications, contraindications, technique, and results of the above procedure should only be interpreted and judged by a Board-Certified Interventional Pain Specialist with extensive familiarity and expertise in the same exact procedure and technique.

## 2017-06-14 ENCOUNTER — Telehealth: Payer: Self-pay | Admitting: *Deleted

## 2017-06-14 NOTE — Telephone Encounter (Signed)
Attempted to call patient for post procedure follow-up. Unable to leave a message.

## 2017-06-28 ENCOUNTER — Encounter: Payer: Self-pay | Admitting: Student in an Organized Health Care Education/Training Program

## 2017-06-28 ENCOUNTER — Ambulatory Visit
Payer: PPO | Attending: Student in an Organized Health Care Education/Training Program | Admitting: Student in an Organized Health Care Education/Training Program

## 2017-06-28 ENCOUNTER — Other Ambulatory Visit: Payer: Self-pay

## 2017-06-28 VITALS — BP 144/46 | HR 64 | Temp 97.9°F | Resp 16 | Ht 70.0 in | Wt 179.0 lb

## 2017-06-28 DIAGNOSIS — M47812 Spondylosis without myelopathy or radiculopathy, cervical region: Secondary | ICD-10-CM | POA: Diagnosis not present

## 2017-06-28 DIAGNOSIS — Z96652 Presence of left artificial knee joint: Secondary | ICD-10-CM | POA: Insufficient documentation

## 2017-06-28 DIAGNOSIS — Z9889 Other specified postprocedural states: Secondary | ICD-10-CM | POA: Diagnosis not present

## 2017-06-28 DIAGNOSIS — I2510 Atherosclerotic heart disease of native coronary artery without angina pectoris: Secondary | ICD-10-CM | POA: Insufficient documentation

## 2017-06-28 DIAGNOSIS — M542 Cervicalgia: Secondary | ICD-10-CM | POA: Insufficient documentation

## 2017-06-28 DIAGNOSIS — I10 Essential (primary) hypertension: Secondary | ICD-10-CM | POA: Insufficient documentation

## 2017-06-28 DIAGNOSIS — I6523 Occlusion and stenosis of bilateral carotid arteries: Secondary | ICD-10-CM

## 2017-06-28 DIAGNOSIS — Z79899 Other long term (current) drug therapy: Secondary | ICD-10-CM | POA: Diagnosis not present

## 2017-06-28 DIAGNOSIS — M1990 Unspecified osteoarthritis, unspecified site: Secondary | ICD-10-CM | POA: Diagnosis not present

## 2017-06-28 DIAGNOSIS — G4730 Sleep apnea, unspecified: Secondary | ICD-10-CM | POA: Insufficient documentation

## 2017-06-28 DIAGNOSIS — F17210 Nicotine dependence, cigarettes, uncomplicated: Secondary | ICD-10-CM | POA: Insufficient documentation

## 2017-06-28 DIAGNOSIS — E785 Hyperlipidemia, unspecified: Secondary | ICD-10-CM | POA: Insufficient documentation

## 2017-06-28 DIAGNOSIS — I70213 Atherosclerosis of native arteries of extremities with intermittent claudication, bilateral legs: Secondary | ICD-10-CM | POA: Diagnosis not present

## 2017-06-28 NOTE — Progress Notes (Signed)
Safety precautions to be maintained throughout the outpatient stay will include: orient to surroundings, keep bed in low position, maintain call bell within reach at all times, provide assistance with transfer out of bed and ambulation.  

## 2017-06-28 NOTE — Progress Notes (Signed)
Patient's Name: Ivan Reyes  MRN: [id number]  Referring Provider: Madelyn Brunner, MD  DOB: 12-28-1945  PCP: Madelyn Brunner, MD  DOS: 06/28/2017  Note by: Gillis Santa, MD  Service setting: Ambulatory outpatient  Specialty: Interventional Pain Management  Location: [facility] (AMB) Pain Management Facility    Patient type: Established   Primary Reason(s) for Visit: Encounter for post-procedure evaluation of chronic illness with mild to moderate exacerbation CC: Neck Pain  HPI  [name] is a 71 y.o. year old, male patient, who comes today for a post-procedure evaluation. He has S/P total knee arthroplasty; Leg pain; Atherosclerosis of native arteries of extremity with intermittent claudication ([place]); Carotid stenosis; DJD (degenerative joint disease); HTN (hypertension); and Hyperlipidemia on their problem list. His primarily concern today is the Neck Pain  Pain Assessment: History of left neck pain that radiated to the anterior part of the patient's shoulder along with the posterior aspect of the scapula.  Patient also intermittently had right arm pain as well.  He describes it as numbing, tingling, stabbing, throbbing.  This is significantly improved since the cervical facet blocks.  His pain is down from a 6 to is 0.  He is able to sleep and do activities of daily living with greater ease.  He is pleased with the results.  Note: Reported level is compatible with observation.                         When using our objective Pain Scale, levels between 6 and 10/10 are said to belong in an emergency room, as it progressively worsens from a 6/10, described as severely limiting, requiring emergency care not usually available at an outpatient pain management facility. At a 6/10 level, communication becomes difficult and requires great effort. Assistance to reach the emergency department may be required. Facial flushing and profuse sweating along with potentially dangerous increases in heart  rate and blood pressure will be evident.  [name] comes in today for post-procedure evaluation after the treatment done on 06/13/2017.  Further details on both, my assessment(s), as well as the proposed treatment plan, please see below.  Post-Procedure Assessment  06/13/2017 Procedure: Bilateral C5/6, C6/7 cervical facet injections with steroid. Pre-procedure pain score:  1/10 Post-procedure pain score: 0/10         Influential Factors: BMI: 25.68 kg/m Intra-procedural challenges: None observed.         Assessment challenges: None detected.              Reported side-effects: None.        Post-procedural adverse reactions or complications: None reported         Sedation: Please see nurses note. When no sedatives are used, the analgesic levels obtained are directly associated to the effectiveness of the local anesthetics. However, when sedation is provided, the level of analgesia obtained during the initial 1 hour following the intervention, is believed to be the result of a combination of factors. These factors may include, but are not limited to: 1. The effectiveness of the local anesthetics used. 2. The effects of the analgesic(s) and/or anxiolytic(s) used. 3. The degree of discomfort experienced by the patient at the time of the procedure. 4. The patients ability and reliability in recalling and recording the events. 5. The presence and influence of possible secondary gains and/or psychosocial factors. Reported result: Relief experienced during the 1st hour after the procedure: 100 % (Ultra-Short Term Relief)  Interpretative annotation: Clinically appropriate result. Analgesia during this period is likely to be Local Anesthetic and/or IV Sedative (Analgesic/Anxiolytic) related.          Effects of local anesthetic: The analgesic effects attained during this period are directly associated to the localized infiltration of local anesthetics and therefore cary significant  diagnostic value as to the etiological location, or anatomical origin, of the pain. Expected duration of relief is directly dependent on the pharmacodynamics of the local anesthetic used. Long-acting (4-6 hours) anesthetics used.  Reported result: Relief during the next 4 to 6 hour after the procedure: 100 % (Short-Term Relief)            Interpretative annotation: Clinically appropriate result. Analgesia during this period is likely to be Local Anesthetic-related.          Long-term benefit: Defined as the period of time past the expected duration of local anesthetics (1 hour for short-acting and 4-6 hours for long-acting). With the possible exception of prolonged sympathetic blockade from the local anesthetics, benefits during this period are typically attributed to, or associated with, other factors such as analgesic sensory neuropraxia, antiinflammatory effects, or beneficial biochemical changes provided by agents other than the local anesthetics.  Reported result: Extended relief following procedure: 100 % (Long-Term Relief)            Interpretative annotation: Clinically appropriate result. Good relief. No permanent benefit expected. Inflammation plays a part in the etiology to the pain.          Current benefits: Defined as reported results that persistent at this point in time.   Analgesia: 100 % Mr. Oleson reports that both, extremity and the axial pain improved with the treatment. Function: Back to baseline ROM: Back to baseline Interpretative annotation: Complete relief. Therapeutic success. Effective therapeutic approach.          Interpretation: Results would suggest a successful diagnostic and therapeutic intervention.                  Plan:  Please see "Plan of Care" for details.        Laboratory Chemistry  Inflammation Markers (CRP: Acute Phase) (ESR: Chronic Phase) Lab Results  Component Value Date   ESRSEDRATE 6 06/16/2016                 Renal Function Markers Lab  Results  Component Value Date   BUN 13 07/02/2016   CREATININE 0.82 07/02/2016   GFRAA >60 07/02/2016   GFRNONAA >60 07/02/2016                 Hepatic Function Markers Lab Results  Component Value Date   AST 26 06/16/2016   ALT 16 (L) 06/16/2016   ALBUMIN 4.4 06/16/2016   ALKPHOS 39 06/16/2016                 Electrolytes Lab Results  Component Value Date   NA 135 07/02/2016   K 4.1 12/22/2016   CL 103 07/02/2016   CALCIUM 8.7 (L) 07/02/2016                 Neuropathy Markers No results found for: VANVBTYO06               Bone Pathology Markers Lab Results  Component Value Date   ALKPHOS 39 06/16/2016   CALCIUM 8.7 (L) 07/02/2016                 Rheumatology Markers No results found for: LABURIC  Coagulation Parameters Lab Results  Component Value Date   INR 0.92 06/16/2016   LABPROT 12.3 06/16/2016   APTT 28 06/16/2016   PLT 245 12/22/2016                 Cardiovascular Markers Lab Results  Component Value Date   HGB 13.4 12/22/2016   HCT 40.3 12/22/2016                 CA Markers No results found for: CEA, CA125, LABCA2               Note: Lab results reviewed.  Recent Diagnostic Imaging Results  DG C-Arm 1-60 Min-No Report Fluoroscopy was utilized by the requesting physician.  No radiographic  interpretation.   Complexity Note: Imaging results reviewed. Results shared with Mr. Brouillet, using Layman's terms.                         Meds   Current Outpatient Medications:  .  Cholecalciferol (VITAMIN D) 2000 units CAPS, Take 2,000 Units by mouth every evening., Disp: , Rfl:  .  clopidogrel (PLAVIX) 75 MG tablet, Take 75 mg by mouth daily., Disp: , Rfl:  .  enalapril (VASOTEC) 20 MG tablet, Take 20 mg by mouth 2 (two) times daily., Disp: , Rfl:  .  fenofibrate micronized (LOFIBRA) 134 MG capsule, Take 134 mg by mouth daily before breakfast. , Disp: , Rfl:  .  gabapentin (NEURONTIN) 100 MG capsule, Take 200 mg by mouth 4 (four)  times daily. , Disp: , Rfl:  .  Glucosamine HCl (GLUCOSAMINE PO), Take 1 tablet by mouth 2 (two) times daily., Disp: , Rfl:  .  hydrochlorothiazide (HYDRODIURIL) 12.5 MG tablet, Take 12.5 mg by mouth daily., Disp: , Rfl:  .  ibuprofen (ADVIL,MOTRIN) 800 MG tablet, Take 800 mg by mouth every 8 (eight) hours as needed for headache., Disp: , Rfl:  .  Omega-3 Fatty Acids (OMEGA-3 FISH OIL) 300 MG CAPS, Take 300 mg by mouth 2 (two) times daily., Disp: , Rfl:  .  oxyCODONE (ROXICODONE) 5 MG immediate release tablet, Take 1-2 tablets (5-10 mg total) by mouth every 4 (four) hours as needed for severe pain., Disp: 30 tablet, Rfl: 0 .  sildenafil (REVATIO) 20 MG tablet, Take 60-100 mg by mouth daily as needed (for erectile dysfunction)., Disp: , Rfl:  .  simvastatin (ZOCOR) 40 MG tablet, Take 40 mg by mouth daily at 6 PM. , Disp: , Rfl:  .  vitamin B-12 (CYANOCOBALAMIN) 1000 MCG tablet, Take 1,000 mcg by mouth daily., Disp: , Rfl:  .  vitamin E 400 UNIT capsule, Take 400 Units by mouth every evening. , Disp: , Rfl:   ROS  Constitutional: Denies any fever or chills Gastrointestinal: No reported hemesis, hematochezia, vomiting, or acute GI distress Musculoskeletal: Denies any acute onset joint swelling, redness, loss of ROM, or weakness Neurological: No reported episodes of acute onset apraxia, aphasia, dysarthria, agnosia, amnesia, paralysis, loss of coordination, or loss of consciousness  Allergies  Mr. Castellana has No Known Allergies.  [place]  Drug: Mr. Barella  reports that he does not use drugs. Alcohol:  reports that he drinks about 1.2 oz of alcohol per week. Tobacco:  reports that he has been smoking cigarettes.  He has been smoking about 1.00 pack per day. he has never used smokeless tobacco. Medical:  has a past medical history of Arthritis, Coronary artery disease, Hyperlipidemia, Hypertension, Peripheral vascular disease ([place]), and  Sleep apnea. Surgical: Mr. Town  has a past surgical  history that includes Carotid endarterectomy (Bilateral); Coronary angioplasty; Eye surgery (Bilateral); Fracture surgery (Right); Joint replacement; CARPAL TUNNEL RELEASE ENDOSCOPIC (Right, 01/13/2017); CARPAL TUNNEL RELEASE ENDOSCOPIC (Left, 12/23/2016); and COMPUTER ASSISTED TOTAL KNEE ARTHROPLASTY (Left, 06/30/2016). Family: family history is not on file.  Constitutional Exam  General appearance: Well nourished, well developed, and well hydrated. In no apparent acute distress Vitals:   06/28/17 0935  BP: (!) 144/46  Pulse: 64  Resp: 16  Temp: 97.9 F (36.6 C)  SpO2: 98%  Weight: 179 lb (81.2 kg)  Height: _0  (1.778 m)   BMI Assessment: Estimated body mass index is 25.68 kg/m as calculated from the following:   Height as of this encounter: _1  (1.778 m).   Weight as of this encounter: 179 lb (81.2 kg).  BMI interpretation table: BMI level Category Range association with higher incidence of chronic pain  <18 kg/m2 Underweight   18.5-24.9 kg/m2 Ideal body weight   25-29.9 kg/m2 Overweight Increased incidence by 20%  30-34.9 kg/m2 Obese (Class I) Increased incidence by 68%  35-39.9 kg/m2 Severe obesity (Class II) Increased incidence by 136%  >40 kg/m2 Extreme obesity (Class III) Increased incidence by 254%   BMI Readings from Last 4 Encounters:  06/28/17 25.68 kg/m  06/13/17 25.97 kg/m  05/25/17 26.26 kg/m  01/06/17 26.08 kg/m   Wt Readings from Last 4 Encounters:  06/28/17 179 lb (81.2 kg)  06/13/17 181 lb (82.1 kg)  05/25/17 183 lb (83 kg)  01/06/17 187 lb (84.8 kg)  Psych/Mental status: Alert, oriented x 3 (person, place, & time)       Eyes: PERLA Respiratory: No evidence of acute respiratory distress  Cervical Spine Area Exam  Skin & Axial Inspection: No masses, redness, edema, swelling, or associated skin lesions Alignment: Symmetrical Functional ROM: Improved after treatment      Stability: No instability detected Muscle Tone/Strength: Functionally  intact. No obvious neuro-muscular anomalies detected. Sensory (Neurological): Improved Palpation: No palpable anomalies              Upper Extremity (UE) Exam    Side: Right upper extremity  Side: Left upper extremity  Skin & Extremity Inspection: Skin color, temperature, and hair growth are WNL. No peripheral edema or cyanosis. No masses, redness, swelling, asymmetry, or associated skin lesions. No contractures.  Skin & Extremity Inspection: Skin color, temperature, and hair growth are WNL. No peripheral edema or cyanosis. No masses, redness, swelling, asymmetry, or associated skin lesions. No contractures.  Functional ROM: Unrestricted ROM          Functional ROM: Unrestricted ROM          Muscle Tone/Strength: Functionally intact. No obvious neuro-muscular anomalies detected.  Muscle Tone/Strength: Functionally intact. No obvious neuro-muscular anomalies detected.  Sensory (Neurological): Unimpaired          Sensory (Neurological): Unimpaired          Palpation: No palpable anomalies              Palpation: No palpable anomalies              Specialized Test(s): Deferred         Specialized Test(s): Deferred          Thoracic Spine Area Exam  Skin & Axial Inspection: No masses, redness, or swelling Alignment: Symmetrical Functional ROM: Unrestricted ROM Stability: No instability detected Muscle Tone/Strength: Functionally intact. No obvious neuro-muscular anomalies detected. Sensory (Neurological): Unimpaired Muscle strength &  Tone: No palpable anomalies  Lumbar Spine Area Exam  Skin & Axial Inspection: No masses, redness, or swelling Alignment: Symmetrical Functional ROM: Unrestricted ROM      Stability: No instability detected Muscle Tone/Strength: Functionally intact. No obvious neuro-muscular anomalies detected. Sensory (Neurological): Unimpaired Palpation: No palpable anomalies       Provocative Tests: Lumbar Hyperextension and rotation test: evaluation deferred today        Lumbar Lateral bending test: evaluation deferred today       Patrick's Maneuver: evaluation deferred today                    Gait & Posture Assessment  Ambulation: Unassisted Gait: Relatively normal for age and body habitus Posture: WNL   Lower Extremity Exam    Side: Right lower extremity  Side: Left lower extremity  Skin & Extremity Inspection: Skin color, temperature, and hair growth are WNL. No peripheral edema or cyanosis. No masses, redness, swelling, asymmetry, or associated skin lesions. No contractures.  Skin & Extremity Inspection: Skin color, temperature, and hair growth are WNL. No peripheral edema or cyanosis. No masses, redness, swelling, asymmetry, or associated skin lesions. No contractures.  Functional ROM: Unrestricted ROM          Functional ROM: Unrestricted ROM          Muscle Tone/Strength: Functionally intact. No obvious neuro-muscular anomalies detected.  Muscle Tone/Strength: Functionally intact. No obvious neuro-muscular anomalies detected.  Sensory (Neurological): Unimpaired  Sensory (Neurological): Unimpaired  Palpation: No palpable anomalies  Palpation: No palpable anomalies   Assessment  Primary Diagnosis & Pertinent Problem List: The primary encounter diagnosis was Cervical spondylosis without myelopathy. Diagnoses of Cervical facet joint syndrome, Cervicalgia, Bilateral carotid artery stenosis, and Atherosclerosis of native artery of both lower extremities with intermittent claudication ([facility]) were also pertinent to this visit.  Status Diagnosis  Controlled Controlled Controlled 1. Cervical spondylosis without myelopathy   2. Cervical facet joint syndrome   3. Cervicalgia   4. Bilateral carotid artery stenosis   5. Atherosclerosis of native artery of both lower extremities with intermittent claudication ([place])      71 year old male with cervical spondylosis status post bilateral C5, 6 and C6, 7 medial branch facet blocks with steroid who presents for  follow-up.  Patient notes 100% pain relief of his neck pain and is radiating upper extremity pain.  Endorses improve range of motion both to the right and left and improved cervical extension.  Given significant pain relief and improvement in function, patient can follow-up as needed for repeat block should symptoms return.  Time Note: Greater than 50% of the 25 minute(s) of face-to-face time spent with Mr. Mcqueary, was spent in counseling/coordination of care regarding: the treatment plan, the risks and possible complications of proposed treatment and the results, interpretation and significance of  his recent diagnostic interventional treatment(s). Provider-requested follow-up: Return if symptoms worsen or fail to improve.  No future appointments.  Primary Care Physician: Madelyn Brunner, MD Location: [facility] Note by: Gillis Santa, M.D Date: 06/28/2017; Time: 9:57 AM  There are no Patient Instructions on file for this visit.

## 2017-07-04 DIAGNOSIS — I6523 Occlusion and stenosis of bilateral carotid arteries: Secondary | ICD-10-CM | POA: Diagnosis not present

## 2017-07-04 DIAGNOSIS — Z720 Tobacco use: Secondary | ICD-10-CM | POA: Diagnosis not present

## 2017-07-04 DIAGNOSIS — I2510 Atherosclerotic heart disease of native coronary artery without angina pectoris: Secondary | ICD-10-CM | POA: Diagnosis not present

## 2017-07-04 DIAGNOSIS — F17200 Nicotine dependence, unspecified, uncomplicated: Secondary | ICD-10-CM | POA: Diagnosis not present

## 2017-07-04 DIAGNOSIS — G4733 Obstructive sleep apnea (adult) (pediatric): Secondary | ICD-10-CM | POA: Diagnosis not present

## 2017-07-04 DIAGNOSIS — E782 Mixed hyperlipidemia: Secondary | ICD-10-CM | POA: Diagnosis not present

## 2017-07-04 DIAGNOSIS — I10 Essential (primary) hypertension: Secondary | ICD-10-CM | POA: Diagnosis not present

## 2017-07-11 DIAGNOSIS — D2262 Melanocytic nevi of left upper limb, including shoulder: Secondary | ICD-10-CM | POA: Diagnosis not present

## 2017-07-11 DIAGNOSIS — L720 Epidermal cyst: Secondary | ICD-10-CM | POA: Diagnosis not present

## 2017-07-11 DIAGNOSIS — D225 Melanocytic nevi of trunk: Secondary | ICD-10-CM | POA: Diagnosis not present

## 2017-07-11 DIAGNOSIS — X32XXXA Exposure to sunlight, initial encounter: Secondary | ICD-10-CM | POA: Diagnosis not present

## 2017-07-11 DIAGNOSIS — D2261 Melanocytic nevi of right upper limb, including shoulder: Secondary | ICD-10-CM | POA: Diagnosis not present

## 2017-07-11 DIAGNOSIS — L570 Actinic keratosis: Secondary | ICD-10-CM | POA: Diagnosis not present

## 2017-08-20 DIAGNOSIS — R05 Cough: Secondary | ICD-10-CM | POA: Diagnosis not present

## 2017-08-20 DIAGNOSIS — B9689 Other specified bacterial agents as the cause of diseases classified elsewhere: Secondary | ICD-10-CM | POA: Diagnosis not present

## 2017-08-20 DIAGNOSIS — J019 Acute sinusitis, unspecified: Secondary | ICD-10-CM | POA: Diagnosis not present

## 2017-08-23 DIAGNOSIS — Z720 Tobacco use: Secondary | ICD-10-CM | POA: Diagnosis not present

## 2017-08-23 DIAGNOSIS — I2510 Atherosclerotic heart disease of native coronary artery without angina pectoris: Secondary | ICD-10-CM | POA: Diagnosis not present

## 2017-08-23 DIAGNOSIS — R1310 Dysphagia, unspecified: Secondary | ICD-10-CM | POA: Diagnosis not present

## 2017-08-23 DIAGNOSIS — E782 Mixed hyperlipidemia: Secondary | ICD-10-CM | POA: Diagnosis not present

## 2017-08-23 DIAGNOSIS — I10 Essential (primary) hypertension: Secondary | ICD-10-CM | POA: Diagnosis not present

## 2017-08-23 DIAGNOSIS — Z5181 Encounter for therapeutic drug level monitoring: Secondary | ICD-10-CM | POA: Diagnosis not present

## 2017-09-26 DIAGNOSIS — R4702 Dysphasia: Secondary | ICD-10-CM | POA: Diagnosis not present

## 2017-09-30 DIAGNOSIS — Z01818 Encounter for other preprocedural examination: Secondary | ICD-10-CM | POA: Diagnosis not present

## 2017-09-30 DIAGNOSIS — Z720 Tobacco use: Secondary | ICD-10-CM | POA: Diagnosis not present

## 2017-09-30 DIAGNOSIS — E782 Mixed hyperlipidemia: Secondary | ICD-10-CM | POA: Diagnosis not present

## 2017-09-30 DIAGNOSIS — I6523 Occlusion and stenosis of bilateral carotid arteries: Secondary | ICD-10-CM | POA: Diagnosis not present

## 2017-09-30 DIAGNOSIS — I2510 Atherosclerotic heart disease of native coronary artery without angina pectoris: Secondary | ICD-10-CM | POA: Diagnosis not present

## 2017-09-30 DIAGNOSIS — G4733 Obstructive sleep apnea (adult) (pediatric): Secondary | ICD-10-CM | POA: Diagnosis not present

## 2017-09-30 DIAGNOSIS — I10 Essential (primary) hypertension: Secondary | ICD-10-CM | POA: Diagnosis not present

## 2017-10-05 DIAGNOSIS — N528 Other male erectile dysfunction: Secondary | ICD-10-CM | POA: Insufficient documentation

## 2017-10-21 DIAGNOSIS — L729 Follicular cyst of the skin and subcutaneous tissue, unspecified: Secondary | ICD-10-CM | POA: Diagnosis not present

## 2017-11-08 DIAGNOSIS — M7042 Prepatellar bursitis, left knee: Secondary | ICD-10-CM | POA: Diagnosis not present

## 2017-11-08 DIAGNOSIS — M25562 Pain in left knee: Secondary | ICD-10-CM | POA: Diagnosis not present

## 2017-12-06 DIAGNOSIS — M7042 Prepatellar bursitis, left knee: Secondary | ICD-10-CM | POA: Diagnosis not present

## 2017-12-12 ENCOUNTER — Other Ambulatory Visit: Payer: Self-pay

## 2017-12-12 ENCOUNTER — Encounter: Payer: Self-pay | Admitting: Student

## 2017-12-12 ENCOUNTER — Encounter
Admission: RE | Disposition: A | Discharge status: Home / Self Care | Payer: Self-pay | Source: Ambulatory Visit | Attending: Unknown Physician Specialty

## 2017-12-12 ENCOUNTER — Ambulatory Visit: Payer: PPO | Admitting: Anesthesiology

## 2017-12-12 ENCOUNTER — Ambulatory Visit
Admission: RE | Admit: 2017-12-12 | Discharge: 2017-12-12 | Disposition: A | Discharge status: Home / Self Care | Payer: PPO | Source: Ambulatory Visit | Attending: Unknown Physician Specialty | Admitting: Unknown Physician Specialty

## 2017-12-12 DIAGNOSIS — E785 Hyperlipidemia, unspecified: Secondary | ICD-10-CM | POA: Diagnosis not present

## 2017-12-12 DIAGNOSIS — Z7902 Long term (current) use of antithrombotics/antiplatelets: Secondary | ICD-10-CM | POA: Insufficient documentation

## 2017-12-12 DIAGNOSIS — Z96652 Presence of left artificial knee joint: Secondary | ICD-10-CM | POA: Diagnosis not present

## 2017-12-12 DIAGNOSIS — K2270 Barrett's esophagus without dysplasia: Secondary | ICD-10-CM | POA: Diagnosis not present

## 2017-12-12 DIAGNOSIS — K2970 Gastritis, unspecified, without bleeding: Secondary | ICD-10-CM | POA: Diagnosis not present

## 2017-12-12 DIAGNOSIS — K3189 Other diseases of stomach and duodenum: Secondary | ICD-10-CM | POA: Diagnosis not present

## 2017-12-12 DIAGNOSIS — R1310 Dysphagia, unspecified: Secondary | ICD-10-CM | POA: Diagnosis not present

## 2017-12-12 DIAGNOSIS — Z9989 Dependence on other enabling machines and devices: Secondary | ICD-10-CM | POA: Diagnosis not present

## 2017-12-12 DIAGNOSIS — Z9861 Coronary angioplasty status: Secondary | ICD-10-CM | POA: Insufficient documentation

## 2017-12-12 DIAGNOSIS — I739 Peripheral vascular disease, unspecified: Secondary | ICD-10-CM | POA: Insufficient documentation

## 2017-12-12 DIAGNOSIS — Z7982 Long term (current) use of aspirin: Secondary | ICD-10-CM | POA: Diagnosis not present

## 2017-12-12 DIAGNOSIS — I10 Essential (primary) hypertension: Secondary | ICD-10-CM | POA: Insufficient documentation

## 2017-12-12 DIAGNOSIS — I059 Rheumatic mitral valve disease, unspecified: Secondary | ICD-10-CM | POA: Diagnosis not present

## 2017-12-12 DIAGNOSIS — G4730 Sleep apnea, unspecified: Secondary | ICD-10-CM | POA: Insufficient documentation

## 2017-12-12 DIAGNOSIS — K2950 Unspecified chronic gastritis without bleeding: Secondary | ICD-10-CM | POA: Diagnosis not present

## 2017-12-12 DIAGNOSIS — I2510 Atherosclerotic heart disease of native coronary artery without angina pectoris: Secondary | ICD-10-CM | POA: Diagnosis not present

## 2017-12-12 DIAGNOSIS — K222 Esophageal obstruction: Secondary | ICD-10-CM | POA: Diagnosis not present

## 2017-12-12 DIAGNOSIS — Z79899 Other long term (current) drug therapy: Secondary | ICD-10-CM | POA: Insufficient documentation

## 2017-12-12 DIAGNOSIS — M1990 Unspecified osteoarthritis, unspecified site: Secondary | ICD-10-CM | POA: Insufficient documentation

## 2017-12-12 DIAGNOSIS — F17210 Nicotine dependence, cigarettes, uncomplicated: Secondary | ICD-10-CM | POA: Diagnosis not present

## 2017-12-12 DIAGNOSIS — R4702 Dysphasia: Secondary | ICD-10-CM | POA: Diagnosis not present

## 2017-12-12 HISTORY — PX: ESOPHAGOGASTRODUODENOSCOPY (EGD) WITH PROPOFOL: SHX5813

## 2017-12-12 HISTORY — DX: Rheumatic mitral valve disease, unspecified: I05.9

## 2017-12-12 SURGERY — ESOPHAGOGASTRODUODENOSCOPY (EGD) WITH PROPOFOL
Anesthesia: General

## 2017-12-12 MED ORDER — PROPOFOL 500 MG/50ML IV EMUL
INTRAVENOUS | Status: AC
  Filled 2017-12-12: qty 50

## 2017-12-12 MED ORDER — BUTAMBEN-TETRACAINE-BENZOCAINE 2-2-14 % EX AERO
INHALATION_SPRAY | CUTANEOUS | Status: DC | PRN
  Administered 2017-12-12: 2 via TOPICAL

## 2017-12-12 MED ORDER — FENTANYL CITRATE (PF) 100 MCG/2ML IJ SOLN
INTRAMUSCULAR | Status: AC
  Filled 2017-12-12: qty 2

## 2017-12-12 MED ORDER — EPHEDRINE SULFATE 50 MG/ML IJ SOLN
INTRAMUSCULAR | Status: DC | PRN
  Administered 2017-12-12: 5 mg via INTRAVENOUS

## 2017-12-12 MED ORDER — MIDAZOLAM HCL 2 MG/2ML IJ SOLN
INTRAMUSCULAR | Status: DC | PRN
  Administered 2017-12-12: 2 mg via INTRAVENOUS

## 2017-12-12 MED ORDER — LIDOCAINE HCL (PF) 2 % IJ SOLN
INTRAMUSCULAR | Status: AC
  Filled 2017-12-12: qty 10

## 2017-12-12 MED ORDER — PIPERACILLIN-TAZOBACTAM 3.375 G IVPB
INTRAVENOUS | Status: AC
  Administered 2017-12-12: 3.375 g via INTRAVENOUS
  Filled 2017-12-12: qty 50

## 2017-12-12 MED ORDER — PROPOFOL 500 MG/50ML IV EMUL
INTRAVENOUS | Status: DC | PRN
  Administered 2017-12-12: 120 ug/kg/min via INTRAVENOUS

## 2017-12-12 MED ORDER — EPHEDRINE SULFATE 50 MG/ML IJ SOLN
INTRAMUSCULAR | Status: AC
  Filled 2017-12-12: qty 1

## 2017-12-12 MED ORDER — FENTANYL CITRATE (PF) 100 MCG/2ML IJ SOLN
INTRAMUSCULAR | Status: DC | PRN
  Administered 2017-12-12: 50 ug via INTRAVENOUS

## 2017-12-12 MED ORDER — MIDAZOLAM HCL 2 MG/2ML IJ SOLN
INTRAMUSCULAR | Status: AC
  Filled 2017-12-12: qty 2

## 2017-12-12 MED ORDER — SODIUM CHLORIDE 0.9 % IV SOLN
INTRAVENOUS | Status: DC
  Administered 2017-12-12: 14:00:00 via INTRAVENOUS

## 2017-12-12 MED ORDER — PIPERACILLIN-TAZOBACTAM 3.375 G IVPB 30 MIN
3.3750 g | Freq: Once | INTRAVENOUS | Status: AC
  Administered 2017-12-12: 3.375 g via INTRAVENOUS
  Filled 2017-12-12: qty 50

## 2017-12-12 MED ORDER — SODIUM CHLORIDE 0.9 % IV SOLN: INTRAVENOUS | Status: DC

## 2017-12-12 NOTE — H&P (Signed)
Primary Care Physician:  Baxter Hire, MD Primary Gastroenterologist:  Dr. Vira Agar  Pre-Procedure History & Physical HPI:  Ivan Reyes is a 72 y.o. male is here for an upper endoscopy for dysphagia.   Past Medical History:  Diagnosis Date  . Arthritis   . Coronary artery disease   . Hyperlipidemia   . Hypertension   . Mitral valve disorder   . Peripheral vascular disease ([place])   . Sleep apnea    use C-PAP    Past Surgical History:  Procedure Laterality Date  . CAROTID ENDARTERECTOMY Bilateral    Dr. Delana Meyer, [facility]  . CARPAL TUNNEL RELEASE Left 12/23/2016   Procedure: CARPAL TUNNEL RELEASE ENDOSCOPIC;  Surgeon: Corky Mull, MD;  Location: [facility] ORS;  Service: Orthopedics;  Laterality: Left;  . CARPAL TUNNEL RELEASE Right 01/13/2017   Procedure: CARPAL TUNNEL RELEASE ENDOSCOPIC;  Surgeon: Corky Mull, MD;  Location: [facility] ORS;  Service: Orthopedics;  Laterality: Right;  . CORONARY ANGIOPLASTY    . coronary atherosclerosis of  autologous graft    . EYE SURGERY Bilateral    Cataract Extraction with IOL  . FRACTURE SURGERY Right    Hip Pinning, Dr. Franchot Mimes, Jr  . JOINT REPLACEMENT    . KNEE ARTHROPLASTY Left 06/30/2016   Procedure: COMPUTER ASSISTED TOTAL KNEE ARTHROPLASTY;  Surgeon: Dereck Leep, MD;  Location: [facility] ORS;  Service: Orthopedics;  Laterality: Left;    Prior to Admission medications   Medication Sig Start Date End Date Taking? Authorizing Provider  aspirin 81 MG chewable tablet Chew 81 mg by mouth daily.   Yes [provider]  clopidogrel (PLAVIX) 75 MG tablet Take 75 mg by mouth daily.   Yes [provider]  enalapril (VASOTEC) 20 MG tablet Take 20 mg by mouth 2 (two) times daily.   Yes [provider]  fenofibrate micronized (LOFIBRA) 134 MG capsule Take 134 mg by mouth daily before breakfast.    Yes [provider]  simvastatin (ZOCOR) 40 MG tablet Take 40 mg by mouth daily at 6 PM.    Yes [provider]  vitamin B-12 (CYANOCOBALAMIN) 1000 MCG tablet Take 1,000 mcg by mouth daily.   Yes [provider]  Cholecalciferol (VITAMIN D) 2000 units CAPS Take 2,000 Units by mouth every evening.    [provider]  gabapentin (NEURONTIN) 100 MG capsule Take 200 mg by mouth 4 (four) times daily.  11/10/16   [provider]  Glucosamine HCl (GLUCOSAMINE PO) Take 1 tablet by mouth 2 (two) times daily.    [provider]  hydrochlorothiazide (HYDRODIURIL) 12.5 MG tablet Take 12.5 mg by mouth daily.    [provider]  ibuprofen (ADVIL,MOTRIN) 800 MG tablet Take 800 mg by mouth every 8 (eight) hours as needed for headache.    [provider]  Omega-3 Fatty Acids (OMEGA-3 FISH OIL) 300 MG CAPS Take 300 mg by mouth 2 (two) times daily.    [provider]  oxyCODONE (ROXICODONE) 5 MG immediate release tablet Take 1-2 tablets (5-10 mg total) by mouth every 4 (four) hours as needed for severe pain. Patient not taking: Reported on 12/12/2017 12/23/16   Poggi, Marshall Cork, MD  sildenafil (REVATIO) 20 MG tablet Take 60-100 mg by mouth daily as needed (for erectile dysfunction).    [provider]  vitamin E 400 UNIT capsule Take 400 Units by mouth every evening.     [provider]    Allergies as  of 10/01/2017  . (No Known Allergies)    History reviewed. No pertinent family history.  Social History   Socioeconomic History  . Marital status: Widowed    Spouse name: Not on file  . Number of children: Not on file  . Years of education: Not on file  . Highest education level: Not on file  Occupational History  . Not on file  Social Needs  . Financial resource strain: Not on file  . Food insecurity:    Worry: Not on file    Inability: Not on file  . Transportation needs:    Medical: Not on file    Non-medical: Not on file  Tobacco Use  . Smoking status: Current Every Day Smoker    Packs/day: 0.50    Types:  Cigarettes  . Smokeless tobacco: Never Used  Substance and Sexual Activity  . Alcohol use: Yes    Alcohol/week: 1.2 oz    Types: 2 Cans of beer per week    Comment: daily  . Drug use: No  . Sexual activity: Not on file  Lifestyle  . Physical activity:    Days per week: Not on file    Minutes per session: Not on file  . Stress: Not on file  Relationships  . Social connections:    Talks on phone: Not on file    Gets together: Not on file    Attends religious service: Not on file    Active member of club or organization: Not on file    Attends meetings of clubs or organizations: Not on file    Relationship status: Not on file  . Intimate partner violence:    Fear of current or ex partner: Not on file    Emotionally abused: Not on file    Physically abused: Not on file    Forced sexual activity: Not on file  Other Topics Concern  . Not on file  Social History Narrative  . Not on file    Review of Systems: See HPI, otherwise negative ROS  Physical Exam: BP (!) 149/65   Pulse 63   Temp (!) 97.3 F (36.3 C) (Tympanic)   Resp 16   Ht 5\' 10"  (1.778 m)   Wt 78 kg (172 lb)   SpO2 100%   BMI 24.68 kg/m  General:   Alert,  pleasant and cooperative in NAD Head:  Normocephalic and atraumatic. Neck:  Supple; no masses or thyromegaly. Lungs:  Clear throughout to auscultation.    Heart:  Regular rate and rhythm. Abdomen:  Soft, nontender and nondistended. Normal bowel sounds, without guarding, and without rebound.   Neurologic:  Alert and  oriented x4;  grossly normal neurologically.  Impression/Plan: Ivan Reyes is here for an endoscopy for dysphagia  Risks, benefits, limitations, and alternatives regarding  endoscopy have been reviewed with the patient.  Questions have been answered.  All parties agreeable.   Gaylyn Cheers, MD  12/12/2017, 2:22 PM

## 2017-12-12 NOTE — Anesthesia Post-op Follow-up Note (Signed)
Anesthesia QCDR form completed.        

## 2017-12-12 NOTE — Anesthesia Procedure Notes (Signed)
Performed by: Cook-Martin, Treylen Gibbs Pre-anesthesia Checklist: Patient identified, Emergency Drugs available, Suction available, Patient being monitored and Timeout performed Patient Re-evaluated:Patient Re-evaluated prior to induction Oxygen Delivery Method: Nasal cannula Preoxygenation: Pre-oxygenation with 100% oxygen Induction Type: IV induction Airway Equipment and Method: Bite block Placement Confirmation: positive ETCO2 and CO2 detector       

## 2017-12-12 NOTE — Op Note (Signed)
[facility] Patient Name: Ivan Reyes Procedure Date: 12/12/2017 2:23 PM MRN: [id number] Account #: [id number] Date of Birth: 13-Feb-1946 Admit Type: Outpatient Age: 72 Room: [facility] ENDO ROOM 3 Gender: Male Note Status: Finalized Procedure:            Upper GI endoscopy Indications:          Dysphagia Providers:            Manya Silvas, MD Referring MD:         Baxter Hire, MD (Referring MD) Medicines:            Propofol per Anesthesia Complications:        No immediate complications. Procedure:            Pre-Anesthesia Assessment:                       - After reviewing the risks and benefits, the patient                        was deemed in satisfactory condition to undergo the                        procedure.                       After obtaining informed consent, the endoscope was                        passed under direct vision. Throughout the procedure,                        the patient's blood pressure, pulse, and oxygen                        saturations were monitored continuously. The Endoscope                        was introduced through the mouth, and advanced to the                        second part of duodenum. The upper GI endoscopy was                        accomplished without difficulty. The patient tolerated                        the procedure well. Findings:      There were esophageal mucosal changes suggestive of short-segment       Barrett's esophagus present in the distal esophagus. The maximum       longitudinal extent of these mucosal changes was 3 cm in length. Mucosa       was biopsied with a cold forceps for histology. One specimen bottle was       sent to pathology.      Patchy mild inflammation characterized by erythema and granularity was       found in the gastric antrum. Biopsies were taken with a cold forceps for       histology. Biopsies were taken with a cold forceps for Helicobacter     pylori testing.      The examined duodenum was normal. Impression:           -  Esophageal mucosal changes suggestive of                        short-segment Barrett's esophagus. Biopsied.                       - Gastritis. Biopsied.                       - Normal examined duodenum. Recommendation:       - Await pathology results. Manya Silvas, MD 12/12/2017 2:54:01 PM This report has been signed electronically. Number of Addenda: 0 Note Initiated On: 12/12/2017 2:23 PM      [facility]

## 2017-12-12 NOTE — Anesthesia Preprocedure Evaluation (Signed)
Anesthesia Evaluation  Patient identified by MRN, date of birth, ID band Patient awake    Reviewed: Allergy & Precautions, H&P , NPO status , Patient's Chart, lab work & pertinent test results, reviewed documented beta blocker date and time   Airway Mallampati: II   Neck ROM: full    Dental  (+) Poor Dentition   Pulmonary neg pulmonary ROS, sleep apnea and Continuous Positive Airway Pressure Ventilation , Current Smoker,    Pulmonary exam normal        Cardiovascular Exercise Tolerance: Good hypertension, On Medications + CAD and + Peripheral Vascular Disease  negative cardio ROS Normal cardiovascular exam Rhythm:regular Rate:Normal     Neuro/Psych negative neurological ROS  negative psych ROS   GI/Hepatic negative GI ROS, Neg liver ROS,   Endo/Other  negative endocrine ROS  Renal/GU negative Renal ROS  negative genitourinary   Musculoskeletal   Abdominal   Peds  Hematology negative hematology ROS (+)   Anesthesia Other Findings Past Medical History: No date: Arthritis No date: Coronary artery disease No date: Hyperlipidemia No date: Hypertension No date: Mitral valve disorder No date: Peripheral vascular disease (HCC) No date: Sleep apnea     Comment:  use C-PAP Past Surgical History: No date: CAROTID ENDARTERECTOMY; Bilateral     Comment:  Dr. Delana Meyer, [facility] 12/23/2016: CARPAL TUNNEL RELEASE; Left     Comment:  Procedure: CARPAL TUNNEL RELEASE ENDOSCOPIC;  Surgeon:               Corky Mull, MD;  Location: [facility] ORS;  Service:               Orthopedics;  Laterality: Left; 01/13/2017: CARPAL TUNNEL RELEASE; Right     Comment:  Procedure: CARPAL TUNNEL RELEASE ENDOSCOPIC;  Surgeon:               Corky Mull, MD;  Location: [facility] ORS;  Service:               Orthopedics;  Laterality: Right; No date: CORONARY ANGIOPLASTY No date: coronary atherosclerosis of  autologous graft No date: EYE SURGERY;  Bilateral     Comment:  Cataract Extraction with IOL No date: FRACTURE SURGERY; Right     Comment:  Hip Pinning, Dr. Franchot Mimes, Jr No date: JOINT REPLACEMENT 06/30/2016: KNEE ARTHROPLASTY; Left     Comment:  Procedure: COMPUTER ASSISTED TOTAL KNEE ARTHROPLASTY;                Surgeon: Dereck Leep, MD;  Location: [facility] ORS;                Service: Orthopedics;  Laterality: Left; BMI    Body Mass Index:  24.68 kg/m     Reproductive/Obstetrics negative OB ROS                             Anesthesia Physical Anesthesia Plan  ASA: III  Anesthesia Plan: General   Post-op Pain Management:    Induction:   PONV Risk Score and Plan:   Airway Management Planned:   Additional Equipment:   Intra-op Plan:   Post-operative Plan:   Informed Consent: I have reviewed the patients History and Physical, chart, labs and discussed the procedure including the risks, benefits and alternatives for the proposed anesthesia with the patient or authorized representative who has indicated his/her understanding and acceptance.   Dental Advisory Given  Plan Discussed with: CRNA  Anesthesia Plan Comments:  Anesthesia Quick Evaluation  

## 2017-12-12 NOTE — Transfer of Care (Signed)
Immediate Anesthesia Transfer of Care Note  Patient: Ivan Reyes  Procedure(s) Performed: ESOPHAGOGASTRODUODENOSCOPY (EGD) WITH PROPOFOL (N/A )  Patient Location: PACU  Anesthesia Type:General  Level of Consciousness: awake and sedated  Airway & Oxygen Therapy: Patient Spontanous Breathing and Patient connected to nasal cannula oxygen  Post-op Assessment: Report given to RN and Post -op Vital signs reviewed and stable  Post vital signs: Reviewed and stable  Last Vitals:  Vitals Value Taken Time  BP    Temp    Pulse    Resp    SpO2      Last Pain:  Vitals:   12/12/17 1309  TempSrc: Tympanic  PainSc: 0-No pain         Complications: No apparent anesthesia complications

## 2017-12-13 ENCOUNTER — Encounter: Payer: Self-pay | Admitting: Unknown Physician Specialty

## 2017-12-13 NOTE — Anesthesia Postprocedure Evaluation (Signed)
Anesthesia Post Note  Patient: Ivan Reyes  Procedure(s) Performed: ESOPHAGOGASTRODUODENOSCOPY (EGD) WITH PROPOFOL (N/A )  Patient location during evaluation: Endoscopy Anesthesia Type: General Level of consciousness: awake and alert Pain management: pain level controlled Vital Signs Assessment: post-procedure vital signs reviewed and stable Respiratory status: spontaneous breathing, nonlabored ventilation, respiratory function stable and patient connected to nasal cannula oxygen Cardiovascular status: blood pressure returned to baseline and stable Postop Assessment: no apparent nausea or vomiting Anesthetic complications: no     Last Vitals:  Vitals:   12/12/17 1522 12/12/17 1532  BP: (!) 104/53 (!) 112/56  Pulse: 61 (!) 59  Resp: 16 (!) 21  Temp:    SpO2: 100% 100%    Last Pain:  Vitals:   12/12/17 1532  TempSrc:   PainSc: 0-No pain                 Martha Clan

## 2017-12-16 LAB — SURGICAL PATHOLOGY

## 2017-12-29 ENCOUNTER — Inpatient Hospital Stay: Admission: RE | Admit: 2017-12-29 | Payer: PPO | Source: Ambulatory Visit

## 2017-12-30 ENCOUNTER — Encounter
Admission: RE | Admit: 2017-12-30 | Discharge: 2017-12-30 | Disposition: A | Discharge status: Home / Self Care | Payer: PPO | Source: Ambulatory Visit | Attending: Orthopedic Surgery | Admitting: Orthopedic Surgery

## 2017-12-30 ENCOUNTER — Other Ambulatory Visit: Payer: Self-pay

## 2017-12-30 DIAGNOSIS — Z01812 Encounter for preprocedural laboratory examination: Secondary | ICD-10-CM | POA: Diagnosis not present

## 2017-12-30 LAB — BASIC METABOLIC PANEL
ANION GAP: 8 (ref 5–15)
BUN: 17 mg/dL (ref 6–20)
CO2: 24 mmol/L (ref 22–32)
Calcium: 9.4 mg/dL (ref 8.9–10.3)
Chloride: 105 mmol/L (ref 101–111)
Creatinine, Ser: 0.85 mg/dL (ref 0.61–1.24)
GLUCOSE: 89 mg/dL (ref 65–99)
POTASSIUM: 4 mmol/L (ref 3.5–5.1)
Sodium: 137 mmol/L (ref 135–145)

## 2017-12-30 LAB — DIFFERENTIAL
BASOS ABS: 0.1 10*3/uL (ref 0–0.1)
Basophils Relative: 1 %
Eosinophils Absolute: 0.2 10*3/uL (ref 0–0.7)
Eosinophils Relative: 4 %
LYMPHS ABS: 1.2 10*3/uL (ref 1.0–3.6)
Lymphocytes Relative: 18 %
MONOS PCT: 10 %
Monocytes Absolute: 0.7 10*3/uL (ref 0.2–1.0)
NEUTROS ABS: 4.5 10*3/uL (ref 1.4–6.5)
Neutrophils Relative %: 67 %

## 2017-12-30 LAB — CBC
HEMATOCRIT: 37 % — AB (ref 40.0–52.0)
HEMOGLOBIN: 12.7 g/dL — AB (ref 13.0–18.0)
MCH: 31.3 pg (ref 26.0–34.0)
MCHC: 34.2 g/dL (ref 32.0–36.0)
MCV: 91.5 fL (ref 80.0–100.0)
Platelets: 229 10*3/uL (ref 150–440)
RBC: 4.05 MIL/uL — ABNORMAL LOW (ref 4.40–5.90)
RDW: 13.7 % (ref 11.5–14.5)
WBC: 6.8 10*3/uL (ref 3.8–10.6)

## 2017-12-30 NOTE — Patient Instructions (Signed)
Your procedure is scheduled on: Wed. 01/11/18 Report to Day Surgery. To find out your arrival time please call [phone] between 1PM - 3PM on Tues. 01/10/18.  Remember: Instructions that are not followed completely may result in serious medical risk, up to and including death, or upon the discretion of your surgeon and anesthesiologist your surgery may need to be rescheduled.     _X__ 1. Do not eat food after midnight the night before your procedure.                 No gum chewing or hard candies. You may drink clear liquids up to 2 hours                 before you are scheduled to arrive for your surgery- DO not drink clear                 liquids within 2 hours of the start of your surgery.                 Clear Liquids include:  water, apple juice without pulp, clear carbohydrate                 drink such as Clearfast of Gartorade, Black Coffee or Tea (Do not add                 anything to coffee or tea).  __X__2.  On the morning of surgery brush your teeth with toothpaste and water, you may rinse your mouth with mouthwash if you wish.  Do not swallow any toothpaste of mouthwash.     _X__ 3.  No Alcohol for 24 hours before or after surgery.   _X__ 4.  Do Not Smoke or use e-cigarettes For 24 Hours Prior to Your Surgery.                 Do not use any chewable tobacco products for at least 6 hours prior to                 surgery.  ____  5.  Bring all medications with you on the day of surgery if instructed.   _x___  6.  Notify your doctor if there is any change in your medical condition      (cold, fever, infections).     Do not wear jewelry, make-up, hairpins, clips or nail polish. Do not wear lotions, powders, or perfumes. You may wear deodorant. Do not shave 48 hours prior to surgery. Men may shave face and neck. Do not bring valuables to the hospital.    [facility] is not responsible for any belongings or valuables.  Contacts, dentures or  bridgework may not be worn into surgery. Leave your suitcase in the car. After surgery it may be brought to your room. For patients admitted to the hospital, discharge time is determined by your treatment team.   Patients discharged the day of surgery will not be allowed to drive home.   Please read over the following fact sheets that you were given:    __x_ Take these medicines the morning of surgery with A SIP OF WATER:    1. fenofibrate micronized (LOFIBRA) 134 MG capsule  2.   3.   4.  5.  6.  ____ Fleet Enema (as directed)   __x__ Use CHG Soap as directed  ____ Use inhalers on the day of surgery  ____ Stop metformin 2 days prior to surgery  ____ Take 1/2 of usual insulin dose the night before surgery. No insulin the morning          of surgery.   ___x_ Stop Plavix/aspirin Last Dose on 23rd  ____ Stop Anti-inflammatories ibuprofen (ADVIL,MOTRIN) 800 MG tablet 5/22   __x__ Stop supplements until after surgery.Glucosamine HCl (GLUCOSAMINE PO) 5/22, Omega-3 Fatty Acids (OMEGA-3 FISH OIL) 300 MG CAPS,vitamin E 400 UNIT capsule  __x__ Bring C-Pap to the hospital. Bring it to Same Day Surgery

## 2018-01-10 MED ORDER — CEFAZOLIN SODIUM-DEXTROSE 2-4 GM/100ML-% IV SOLN
2.0000 g | INTRAVENOUS | Status: AC
  Administered 2018-01-11: 2 g via INTRAVENOUS

## 2018-01-11 ENCOUNTER — Ambulatory Visit
Admission: RE | Admit: 2018-01-11 | Discharge: 2018-01-11 | Disposition: A | Discharge status: Home / Self Care | Payer: PPO | Source: Ambulatory Visit | Attending: Orthopedic Surgery | Admitting: Orthopedic Surgery

## 2018-01-11 ENCOUNTER — Ambulatory Visit: Payer: PPO | Admitting: Anesthesiology

## 2018-01-11 ENCOUNTER — Other Ambulatory Visit: Payer: Self-pay

## 2018-01-11 ENCOUNTER — Encounter: Payer: Self-pay | Admitting: Orthopedic Surgery

## 2018-01-11 ENCOUNTER — Encounter
Admission: RE | Disposition: A | Discharge status: Home / Self Care | Payer: Self-pay | Source: Ambulatory Visit | Attending: Orthopedic Surgery

## 2018-01-11 DIAGNOSIS — G4730 Sleep apnea, unspecified: Secondary | ICD-10-CM | POA: Insufficient documentation

## 2018-01-11 DIAGNOSIS — I25810 Atherosclerosis of coronary artery bypass graft(s) without angina pectoris: Secondary | ICD-10-CM | POA: Diagnosis not present

## 2018-01-11 DIAGNOSIS — I2510 Atherosclerotic heart disease of native coronary artery without angina pectoris: Secondary | ICD-10-CM | POA: Diagnosis not present

## 2018-01-11 DIAGNOSIS — Z791 Long term (current) use of non-steroidal anti-inflammatories (NSAID): Secondary | ICD-10-CM | POA: Insufficient documentation

## 2018-01-11 DIAGNOSIS — Z7902 Long term (current) use of antithrombotics/antiplatelets: Secondary | ICD-10-CM | POA: Diagnosis not present

## 2018-01-11 DIAGNOSIS — F17200 Nicotine dependence, unspecified, uncomplicated: Secondary | ICD-10-CM | POA: Diagnosis not present

## 2018-01-11 DIAGNOSIS — I10 Essential (primary) hypertension: Secondary | ICD-10-CM | POA: Diagnosis not present

## 2018-01-11 DIAGNOSIS — Z79899 Other long term (current) drug therapy: Secondary | ICD-10-CM | POA: Diagnosis not present

## 2018-01-11 DIAGNOSIS — Z96652 Presence of left artificial knee joint: Secondary | ICD-10-CM

## 2018-01-11 DIAGNOSIS — M71862 Other specified bursopathies, left knee: Secondary | ICD-10-CM | POA: Insufficient documentation

## 2018-01-11 DIAGNOSIS — I739 Peripheral vascular disease, unspecified: Secondary | ICD-10-CM | POA: Insufficient documentation

## 2018-01-11 DIAGNOSIS — L729 Follicular cyst of the skin and subcutaneous tissue, unspecified: Secondary | ICD-10-CM | POA: Diagnosis not present

## 2018-01-11 DIAGNOSIS — Z720 Tobacco use: Secondary | ICD-10-CM | POA: Insufficient documentation

## 2018-01-11 DIAGNOSIS — Z955 Presence of coronary angioplasty implant and graft: Secondary | ICD-10-CM | POA: Insufficient documentation

## 2018-01-11 DIAGNOSIS — E785 Hyperlipidemia, unspecified: Secondary | ICD-10-CM | POA: Insufficient documentation

## 2018-01-11 DIAGNOSIS — M25862 Other specified joint disorders, left knee: Secondary | ICD-10-CM | POA: Diagnosis not present

## 2018-01-11 DIAGNOSIS — L720 Epidermal cyst: Secondary | ICD-10-CM | POA: Diagnosis not present

## 2018-01-11 HISTORY — PX: MASS EXCISION: SHX2000

## 2018-01-11 SURGERY — EXCISION MASS
Anesthesia: Monitor Anesthesia Care | Laterality: Left | Wound class: Clean

## 2018-01-11 MED ORDER — LACTATED RINGERS IV SOLN
INTRAVENOUS | Status: DC
  Administered 2018-01-11: 14:00:00 via INTRAVENOUS

## 2018-01-11 MED ORDER — OXYCODONE HCL 5 MG/5ML PO SOLN: 5.0000 mg | Freq: Once | ORAL | Status: DC | PRN

## 2018-01-11 MED ORDER — FENTANYL CITRATE (PF) 100 MCG/2ML IJ SOLN
INTRAMUSCULAR | Status: DC | PRN
  Administered 2018-01-11 (×2): 50 ug via INTRAVENOUS

## 2018-01-11 MED ORDER — CEFAZOLIN SODIUM-DEXTROSE 2-4 GM/100ML-% IV SOLN
INTRAVENOUS | Status: AC
  Filled 2018-01-11: qty 100

## 2018-01-11 MED ORDER — FENTANYL CITRATE (PF) 100 MCG/2ML IJ SOLN
INTRAMUSCULAR | Status: AC
  Filled 2018-01-11: qty 2

## 2018-01-11 MED ORDER — PROPOFOL 10 MG/ML IV BOLUS
INTRAVENOUS | Status: AC
  Filled 2018-01-11: qty 20

## 2018-01-11 MED ORDER — BUPIVACAINE HCL (PF) 0.25 % IJ SOLN
INTRAMUSCULAR | Status: AC
  Filled 2018-01-11: qty 30

## 2018-01-11 MED ORDER — CHLORHEXIDINE GLUCONATE 4 % EX LIQD
60.0000 mL | Freq: Once | CUTANEOUS | Status: AC
  Administered 2018-01-11: 4 via TOPICAL

## 2018-01-11 MED ORDER — BUPIVACAINE HCL (PF) 0.25 % IJ SOLN
INTRAMUSCULAR | Status: DC | PRN
  Administered 2018-01-11: 1.5 mL

## 2018-01-11 MED ORDER — MIDAZOLAM HCL 2 MG/2ML IJ SOLN
INTRAMUSCULAR | Status: AC
  Filled 2018-01-11: qty 2

## 2018-01-11 MED ORDER — FENTANYL CITRATE (PF) 100 MCG/2ML IJ SOLN: 25.0000 ug | INTRAMUSCULAR | Status: DC | PRN

## 2018-01-11 MED ORDER — FAMOTIDINE 20 MG PO TABS
20.0000 mg | ORAL_TABLET | Freq: Once | ORAL | Status: AC
  Administered 2018-01-11: 20 mg via ORAL

## 2018-01-11 MED ORDER — SODIUM CHLORIDE 0.9 % IR SOLN
Status: DC | PRN
  Administered 2018-01-11: 1000 mL

## 2018-01-11 MED ORDER — MIDAZOLAM HCL 2 MG/2ML IJ SOLN
INTRAMUSCULAR | Status: DC | PRN
  Administered 2018-01-11 (×2): 1 mg via INTRAVENOUS

## 2018-01-11 MED ORDER — ONDANSETRON HCL 4 MG/2ML IJ SOLN
INTRAMUSCULAR | Status: AC
  Filled 2018-01-11: qty 2

## 2018-01-11 MED ORDER — LIDOCAINE HCL 1 % IJ SOLN
INTRAMUSCULAR | Status: DC | PRN
  Administered 2018-01-11: 1.5 mL

## 2018-01-11 MED ORDER — NEOMYCIN-POLYMYXIN B GU 40-200000 IR SOLN
Status: AC
  Filled 2018-01-11: qty 4

## 2018-01-11 MED ORDER — LIDOCAINE HCL (PF) 1 % IJ SOLN
INTRAMUSCULAR | Status: AC
  Filled 2018-01-11: qty 30

## 2018-01-11 MED ORDER — FAMOTIDINE 20 MG PO TABS
ORAL_TABLET | ORAL | Status: AC
  Administered 2018-01-11: 20 mg via ORAL
  Filled 2018-01-11: qty 1

## 2018-01-11 MED ORDER — OXYCODONE HCL 5 MG PO TABS: 5.0000 mg | ORAL_TABLET | Freq: Once | ORAL | Status: DC | PRN

## 2018-01-11 MED ORDER — PROPOFOL 500 MG/50ML IV EMUL
INTRAVENOUS | Status: AC
  Filled 2018-01-11: qty 50

## 2018-01-11 MED ORDER — DEXAMETHASONE SODIUM PHOSPHATE 10 MG/ML IJ SOLN
INTRAMUSCULAR | Status: AC
  Filled 2018-01-11: qty 1

## 2018-01-11 SURGICAL SUPPLY — 26 items
CANISTER SUCT 1200ML W/VALVE (MISCELLANEOUS) ×2 IMPLANT
DRAPE LAPAROTOMY TRNSV 106X77 (MISCELLANEOUS) ×4 IMPLANT
DRSG DERMACEA 8X12 NADH (GAUZE/BANDAGES/DRESSINGS) IMPLANT
DRSG OPSITE POSTOP 3X4 (GAUZE/BANDAGES/DRESSINGS) ×2 IMPLANT
DURAPREP 26ML APPLICATOR (WOUND CARE) ×2 IMPLANT
ELECT CAUTERY BLADE 6.4 (BLADE) ×2 IMPLANT
ELECT REM PT RETURN 9FT ADLT (ELECTROSURGICAL) ×2
ELECTRODE REM PT RTRN 9FT ADLT (ELECTROSURGICAL) ×1 IMPLANT
GAUZE SPONGE 4X4 12PLY STRL (GAUZE/BANDAGES/DRESSINGS) IMPLANT
GLOVE BIO SURGEON STRL SZ8 (GLOVE) ×4 IMPLANT
GLOVE BIOGEL M STRL SZ7.5 (GLOVE) ×4 IMPLANT
GOWN STRL REUS W/ TWL LRG LVL3 (GOWN DISPOSABLE) ×2 IMPLANT
GOWN STRL REUS W/TWL LRG LVL3 (GOWN DISPOSABLE) ×2
KIT TURNOVER KIT A (KITS) ×2 IMPLANT
NS IRRIG 500ML POUR BTL (IV SOLUTION) ×2 IMPLANT
PACK BASIN MAJOR ARMC (MISCELLANEOUS) IMPLANT
PACK TOTAL KNEE (MISCELLANEOUS) ×2 IMPLANT
SOL PREP PVP 2OZ (MISCELLANEOUS) ×2
SOLUTION PREP PVP 2OZ (MISCELLANEOUS) ×1 IMPLANT
STRIP CLOSURE SKIN 1/2X4 (GAUZE/BANDAGES/DRESSINGS) IMPLANT
SUCTION FRAZIER HANDLE 10FR (MISCELLANEOUS) ×1
SUCTION TUBE FRAZIER 10FR DISP (MISCELLANEOUS) ×1 IMPLANT
SUT VIC AB 2-0 SH 27 (SUTURE) ×1
SUT VIC AB 2-0 SH 27XBRD (SUTURE) ×1 IMPLANT
SUT VIC AB 3-0 PS2 18 (SUTURE) IMPLANT
TAPE MICROPORE 2IN (TAPE) IMPLANT

## 2018-01-11 NOTE — Transfer of Care (Signed)
Immediate Anesthesia Transfer of Care Note  Patient: Ivan Reyes  Procedure(s) Performed: EXCISION CYSTIC MASS ANTERIOR KNEE (Left )  Patient Location: PACU  Anesthesia Type:MAC  Level of Consciousness: awake, alert  and oriented  Airway & Oxygen Therapy: Patient Spontanous Breathing  Post-op Assessment: Report given to RN and Post -op Vital signs reviewed and stable  Post vital signs: Reviewed and stable  Last Vitals:  Vitals Value Taken Time  BP 146/72   Temp    Pulse 57 01/11/2018  4:55 PM  Resp 11 01/11/2018  4:55 PM  SpO2 99 % 01/11/2018  4:55 PM  Vitals shown include unvalidated device data.  Last Pain:  Vitals:   01/11/18 1656  TempSrc:   PainSc: 0-No pain         Complications: No apparent anesthesia complications

## 2018-01-11 NOTE — Op Note (Signed)
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   ANESTHESIA: MAC  ESTIMATED BLOOD LOSS: Minimal  FLUIDS REPLACED: 600 mL of crystalloid  TOURNIQUET TIME: Not used  DRAINS: None  IMPLANTS UTILIZED: None  INDICATIONS FOR SURGERY: [name] is a 72 y.o. year old male who has been seen for complaints of an irritating mass to the anterior aspect of the left knee.  He previously undergone a left total knee arthroplasty without difficulty.  The mass was in line with the surgical incision and was a source of irritation when direct pressure was applied to the knee.. After discussion of the risks and benefits of surgical intervention, the patient expressed understanding of the risks benefits and agree with plans for excision of the prepatellar mass.Marland Kitchen   PROCEDURE IN DETAIL: The patient was brought in the operating room and, after adequate MAC anesthesia, tourniquet was placed on the patient's upper left thigh.  Patient's left knee leg were cleaned and prepped with alcohol and DuraPrep draped usual sterile fashion.  A "timeout" was performed as per usual protocol.  The anticipated incision site was infiltrated with a combination of 1% lidocaine and 0.25% Marcaine menses (3 mL).  A 2 cm incision was made in line with the previous surgical scar.  Blunt dissection was carried down around the palpable mass.  The mass was then excised using tenotomy scissors and submitted to pathology for evaluation.  There was no purulence or evidence of retained suture.  The mass measured approximately 4 mm in diameter.  Good hemostasis was noted.  Wound was irrigated with copious amounts of normal saline with antibiotic solution.  Skin  edges were reapproximated with interrupted sutures of #2-0 Vicryl.  Steri-Strips were applied.  A sterile dressing was applied.  The patient tolerated the procedure well.  He was transported to the recovery room in stable condition.   James P. Holley Bouche M.D.

## 2018-01-11 NOTE — Anesthesia Preprocedure Evaluation (Signed)
Anesthesia Evaluation  Patient identified by MRN, date of birth, ID band Patient awake    Reviewed: Allergy & Precautions, H&P , NPO status , Patient's Chart, lab work & pertinent test results  History of Anesthesia Complications Negative for: history of anesthetic complications  Airway Mallampati: III  TM Distance: <3 FB Neck ROM: full    Dental  (+) Chipped, Poor Dentition   Pulmonary neg shortness of breath, sleep apnea , Current Smoker,           Cardiovascular Exercise Tolerance: Good hypertension, (-) angina+ CAD, + Cardiac Stents and + Peripheral Vascular Disease       Neuro/Psych  Neuromuscular disease negative psych ROS   GI/Hepatic negative GI ROS, Neg liver ROS, neg GERD  ,  Endo/Other  negative endocrine ROS  Renal/GU negative Renal ROS  negative genitourinary   Musculoskeletal  (+) Arthritis ,   Abdominal   Peds  Hematology negative hematology ROS (+)   Anesthesia Other Findings Past Medical History: No date: Arthritis No date: Coronary artery disease No date: Hyperlipidemia No date: Hypertension No date: Mitral valve disorder No date: Peripheral vascular disease (HCC) No date: Sleep apnea     Comment:  use C-PAP  Past Surgical History: No date: CAROTID ENDARTERECTOMY; Bilateral     Comment:  Dr. Delana Meyer, [facility] 12/23/2016: CARPAL TUNNEL RELEASE; Left     Comment:  Procedure: CARPAL TUNNEL RELEASE ENDOSCOPIC;  Surgeon:               Corky Mull, MD;  Location: [facility] ORS;  Service:               Orthopedics;  Laterality: Left; 01/13/2017: CARPAL TUNNEL RELEASE; Right     Comment:  Procedure: CARPAL TUNNEL RELEASE ENDOSCOPIC;  Surgeon:               Corky Mull, MD;  Location: [facility] ORS;  Service:               Orthopedics;  Laterality: Right; No date: CORONARY ANGIOPLASTY     Comment:  3 stents No date: coronary atherosclerosis of  autologous graft 12/12/2017: ESOPHAGOGASTRODUODENOSCOPY (EGD)  WITH PROPOFOL; N/A     Comment:  Procedure: ESOPHAGOGASTRODUODENOSCOPY (EGD) WITH               PROPOFOL;  Surgeon: Manya Silvas, MD;  Location:               [facility] ENDOSCOPY;  Service: Endoscopy;  Laterality: N/A; No date: EYE SURGERY; Bilateral     Comment:  Cataract Extraction with IOL No date: FRACTURE SURGERY; Right     Comment:  Hip Pinning, Dr. Franchot Mimes, Jr No date: JOINT REPLACEMENT     Comment:  left knee 06/30/2016: KNEE ARTHROPLASTY; Left     Comment:  Procedure: COMPUTER ASSISTED TOTAL KNEE ARTHROPLASTY;                Surgeon: Dereck Leep, MD;  Location: [facility] ORS;                Service: Orthopedics;  Laterality: Left;  BMI    Body Mass Index:  25.98 kg/m      Reproductive/Obstetrics negative OB ROS                             Anesthesia Physical Anesthesia Plan  ASA: III  Anesthesia Plan: General   Post-op Pain Management:  Induction: Intravenous  PONV Risk Score and Plan: Propofol infusion and TIVA  Airway Management Planned: Natural Airway and Nasal Cannula  Additional Equipment:   Intra-op Plan:   Post-operative Plan:   Informed Consent: I have reviewed the patients History and Physical, chart, labs and discussed the procedure including the risks, benefits and alternatives for the proposed anesthesia with the patient or authorized representative who has indicated his/her understanding and acceptance.   Dental Advisory Given  Plan Discussed with: Anesthesiologist, CRNA and Surgeon  Anesthesia Plan Comments: (Patient consented for risks of anesthesia including but not limited to:  - adverse reactions to medications - risk of intubation if required - damage to teeth, lips or other oral mucosa - sore throat or hoarseness - Damage to heart, brain, lungs or loss of life  Patient voiced understanding.)        Anesthesia Quick Evaluation

## 2018-01-11 NOTE — Discharge Instructions (Signed)
AMBULATORY SURGERY  DISCHARGE INSTRUCTIONS   1) The drugs that you were given will stay in your system until tomorrow so for the next 24 hours you should not:  A) Drive an automobile B) Make any legal decisions C) Drink any alcoholic beverage   2) You may resume regular meals tomorrow.  Today it is better to start with liquids and gradually work up to solid foods.  You may eat anything you prefer, but it is better to start with liquids, then soup and crackers, and gradually work up to solid foods.   3) Please notify your doctor immediately if you have any unusual bleeding, trouble breathing, redness and pain at the surgery site, drainage, fever, or pain not relieved by medication.    4) Additional Instructions:        Please contact your physician with any problems or Same Day Surgery at [phone], Monday through Friday 6 am to 4 pm, or [place] at [facility] number at [phone]. Instructions after Knee Surgery   James P. Holley Bouche., M.D.     Dept. of [facility]  [place], [place]  [address]   Phone: [phone]   Fax: [phone]   DIET:  Drink plenty of non-alcoholic fluids & begin a light diet.  Resume your normal diet the day after surgery.  ACTIVITY:   You may use crutches or a walker with weight-bearing as tolerated, unless instructed otherwise.  You may wean yourself off of the walker or crutches as tolerated.   Begin doing gentle exercises. Exercising will reduce the pain and swelling, increase motion, and prevent muscle weakness.    Avoid strenuous activities or athletics for a minimum of 4-6 weeks.  Do not drive or operate any equipment until instructed.  WOUND CARE:   Place one to two pillows under the knee the first day or two when sitting or lying.   Continue to use the ice packs periodically to reduce pain and swelling.  The small incisions in your knee are closed with  nylon stitches. The stitches will be removed in the office.  The bulky dressing may be removed on the second day after surgery. DO NOT TOUCH THE STITCHES. Put a Band-Aid over each stitch. Do NOT use any ointments or creams on the incisions.   You may bathe or shower after the stitches are removed at the first office visit following surgery.  MEDICATIONS:  You may resume your regular medications.  Please take the pain medication as prescribed.  Do not take pain medication on an empty stomach.  Do not drive or drink alcoholic beverages when taking pain medications.  CALL THE OFFICE FOR:  Temperature above 101 degrees  Excessive bleeding or drainage on the dressing.  Excessive swelling, coldness, or paleness of the toes.  Persistent nausea and vomiting.  FOLLOW-UP:   You should have an appointment to return to the office in 7-10 days after surgery.

## 2018-01-11 NOTE — Anesthesia Post-op Follow-up Note (Signed)
Anesthesia QCDR form completed.        

## 2018-01-11 NOTE — Anesthesia Postprocedure Evaluation (Signed)
Anesthesia Post Note  Patient: Ivan Reyes  Procedure(s) Performed: EXCISION CYSTIC MASS ANTERIOR KNEE (Left )  Patient location during evaluation: PACU Anesthesia Type: MAC Level of consciousness: awake and alert Pain management: pain level controlled Vital Signs Assessment: post-procedure vital signs reviewed and stable Respiratory status: spontaneous breathing and respiratory function stable Cardiovascular status: stable Anesthetic complications: no     Last Vitals:  Vitals:   01/11/18 1705 01/11/18 1712  BP:  (!) 161/72  Pulse: 63 60  Resp: 14 16  Temp: (!) 36.3 C (!) 36.3 C  SpO2: 98% 100%    Last Pain:  Vitals:   01/11/18 1712  TempSrc:   PainSc: 0-No pain                 KEPHART,WILLIAM K

## 2018-01-11 NOTE — H&P (Signed)
The patient has been re-examined, and the chart reviewed, and there have been no interval changes to the documented history and physical.    The risks, benefits, and alternatives have been discussed at length. The patient expressed understanding of the risks benefits and agreed with plans for surgical intervention.  James P. Hooten, Jr. M.D.    

## 2018-01-11 NOTE — OR Nursing (Signed)
Discussed discharge instructions with pt and wife. Both voice understanding. 

## 2018-01-12 ENCOUNTER — Encounter: Payer: Self-pay | Admitting: Orthopedic Surgery

## 2018-01-13 LAB — SURGICAL PATHOLOGY

## 2018-01-24 DIAGNOSIS — M25552 Pain in left hip: Secondary | ICD-10-CM | POA: Diagnosis not present

## 2018-01-24 DIAGNOSIS — M5442 Lumbago with sciatica, left side: Secondary | ICD-10-CM | POA: Diagnosis not present

## 2018-02-14 DIAGNOSIS — E782 Mixed hyperlipidemia: Secondary | ICD-10-CM | POA: Diagnosis not present

## 2018-02-14 DIAGNOSIS — Z5181 Encounter for therapeutic drug level monitoring: Secondary | ICD-10-CM | POA: Diagnosis not present

## 2018-02-21 DIAGNOSIS — Z125 Encounter for screening for malignant neoplasm of prostate: Secondary | ICD-10-CM | POA: Diagnosis not present

## 2018-02-21 DIAGNOSIS — I10 Essential (primary) hypertension: Secondary | ICD-10-CM | POA: Diagnosis not present

## 2018-02-21 DIAGNOSIS — Z0000 Encounter for general adult medical examination without abnormal findings: Secondary | ICD-10-CM | POA: Diagnosis not present

## 2018-02-21 DIAGNOSIS — D649 Anemia, unspecified: Secondary | ICD-10-CM | POA: Diagnosis not present

## 2018-02-21 DIAGNOSIS — G4733 Obstructive sleep apnea (adult) (pediatric): Secondary | ICD-10-CM | POA: Diagnosis not present

## 2018-02-21 DIAGNOSIS — Z0001 Encounter for general adult medical examination with abnormal findings: Secondary | ICD-10-CM | POA: Diagnosis not present

## 2018-02-21 DIAGNOSIS — E782 Mixed hyperlipidemia: Secondary | ICD-10-CM | POA: Diagnosis not present

## 2018-02-21 DIAGNOSIS — I2510 Atherosclerotic heart disease of native coronary artery without angina pectoris: Secondary | ICD-10-CM | POA: Diagnosis not present

## 2018-03-01 DIAGNOSIS — Z872 Personal history of diseases of the skin and subcutaneous tissue: Secondary | ICD-10-CM | POA: Insufficient documentation

## 2018-03-01 DIAGNOSIS — Z9889 Other specified postprocedural states: Secondary | ICD-10-CM

## 2018-03-15 DIAGNOSIS — K2270 Barrett's esophagus without dysplasia: Secondary | ICD-10-CM | POA: Diagnosis not present

## 2018-03-15 DIAGNOSIS — K2970 Gastritis, unspecified, without bleeding: Secondary | ICD-10-CM | POA: Diagnosis not present

## 2018-03-15 DIAGNOSIS — R1310 Dysphagia, unspecified: Secondary | ICD-10-CM | POA: Diagnosis not present

## 2018-03-28 DIAGNOSIS — E782 Mixed hyperlipidemia: Secondary | ICD-10-CM | POA: Diagnosis not present

## 2018-03-28 DIAGNOSIS — I10 Essential (primary) hypertension: Secondary | ICD-10-CM | POA: Diagnosis not present

## 2018-03-28 DIAGNOSIS — G4733 Obstructive sleep apnea (adult) (pediatric): Secondary | ICD-10-CM | POA: Diagnosis not present

## 2018-03-28 DIAGNOSIS — Z720 Tobacco use: Secondary | ICD-10-CM | POA: Diagnosis not present

## 2018-03-28 DIAGNOSIS — I2510 Atherosclerotic heart disease of native coronary artery without angina pectoris: Secondary | ICD-10-CM | POA: Diagnosis not present

## 2018-03-28 DIAGNOSIS — I6523 Occlusion and stenosis of bilateral carotid arteries: Secondary | ICD-10-CM | POA: Diagnosis not present

## 2018-05-10 DIAGNOSIS — K2970 Gastritis, unspecified, without bleeding: Secondary | ICD-10-CM | POA: Diagnosis not present

## 2018-05-10 DIAGNOSIS — R1310 Dysphagia, unspecified: Secondary | ICD-10-CM | POA: Diagnosis not present

## 2018-05-10 DIAGNOSIS — K2270 Barrett's esophagus without dysplasia: Secondary | ICD-10-CM | POA: Diagnosis not present

## 2018-06-05 DIAGNOSIS — M7918 Myalgia, other site: Secondary | ICD-10-CM | POA: Diagnosis not present

## 2018-06-08 DIAGNOSIS — M7918 Myalgia, other site: Secondary | ICD-10-CM | POA: Diagnosis not present

## 2018-06-08 DIAGNOSIS — M1612 Unilateral primary osteoarthritis, left hip: Secondary | ICD-10-CM | POA: Diagnosis not present

## 2018-06-13 DIAGNOSIS — M7918 Myalgia, other site: Secondary | ICD-10-CM | POA: Diagnosis not present

## 2018-06-13 DIAGNOSIS — K2270 Barrett's esophagus without dysplasia: Secondary | ICD-10-CM | POA: Insufficient documentation

## 2018-06-13 DIAGNOSIS — K224 Dyskinesia of esophagus: Secondary | ICD-10-CM | POA: Insufficient documentation

## 2018-06-13 DIAGNOSIS — N529 Male erectile dysfunction, unspecified: Secondary | ICD-10-CM | POA: Diagnosis not present

## 2018-06-13 DIAGNOSIS — K229 Disease of esophagus, unspecified: Secondary | ICD-10-CM

## 2018-06-13 DIAGNOSIS — R1314 Dysphagia, pharyngoesophageal phase: Secondary | ICD-10-CM | POA: Diagnosis not present

## 2018-06-13 DIAGNOSIS — K228 Other specified diseases of esophagus: Secondary | ICD-10-CM | POA: Diagnosis not present

## 2018-06-13 DIAGNOSIS — M1612 Unilateral primary osteoarthritis, left hip: Secondary | ICD-10-CM | POA: Diagnosis not present

## 2018-07-10 DIAGNOSIS — D2271 Melanocytic nevi of right lower limb, including hip: Secondary | ICD-10-CM | POA: Diagnosis not present

## 2018-07-10 DIAGNOSIS — D2272 Melanocytic nevi of left lower limb, including hip: Secondary | ICD-10-CM | POA: Diagnosis not present

## 2018-07-10 DIAGNOSIS — D2262 Melanocytic nevi of left upper limb, including shoulder: Secondary | ICD-10-CM | POA: Diagnosis not present

## 2018-07-10 DIAGNOSIS — D225 Melanocytic nevi of trunk: Secondary | ICD-10-CM | POA: Diagnosis not present

## 2018-07-10 DIAGNOSIS — L821 Other seborrheic keratosis: Secondary | ICD-10-CM | POA: Diagnosis not present

## 2018-07-10 DIAGNOSIS — D2261 Melanocytic nevi of right upper limb, including shoulder: Secondary | ICD-10-CM | POA: Diagnosis not present

## 2018-07-10 DIAGNOSIS — L720 Epidermal cyst: Secondary | ICD-10-CM | POA: Diagnosis not present

## 2018-08-21 DIAGNOSIS — I10 Essential (primary) hypertension: Secondary | ICD-10-CM | POA: Diagnosis not present

## 2018-08-21 DIAGNOSIS — Z125 Encounter for screening for malignant neoplasm of prostate: Secondary | ICD-10-CM | POA: Diagnosis not present

## 2018-08-30 DIAGNOSIS — E782 Mixed hyperlipidemia: Secondary | ICD-10-CM | POA: Diagnosis not present

## 2018-08-30 DIAGNOSIS — I70219 Atherosclerosis of native arteries of extremities with intermittent claudication, unspecified extremity: Secondary | ICD-10-CM | POA: Diagnosis not present

## 2018-08-30 DIAGNOSIS — I2510 Atherosclerotic heart disease of native coronary artery without angina pectoris: Secondary | ICD-10-CM | POA: Diagnosis not present

## 2018-08-30 DIAGNOSIS — Z1211 Encounter for screening for malignant neoplasm of colon: Secondary | ICD-10-CM | POA: Diagnosis not present

## 2018-08-30 DIAGNOSIS — K229 Disease of esophagus, unspecified: Secondary | ICD-10-CM | POA: Diagnosis not present

## 2018-08-30 DIAGNOSIS — I6523 Occlusion and stenosis of bilateral carotid arteries: Secondary | ICD-10-CM | POA: Diagnosis not present

## 2018-08-30 DIAGNOSIS — N529 Male erectile dysfunction, unspecified: Secondary | ICD-10-CM | POA: Diagnosis not present

## 2018-08-30 DIAGNOSIS — K2270 Barrett's esophagus without dysplasia: Secondary | ICD-10-CM | POA: Diagnosis not present

## 2018-08-30 DIAGNOSIS — I10 Essential (primary) hypertension: Secondary | ICD-10-CM | POA: Diagnosis not present

## 2018-08-30 DIAGNOSIS — D649 Anemia, unspecified: Secondary | ICD-10-CM | POA: Diagnosis not present

## 2018-09-01 ENCOUNTER — Encounter: Payer: Self-pay | Admitting: *Deleted

## 2018-09-01 DIAGNOSIS — K2970 Gastritis, unspecified, without bleeding: Secondary | ICD-10-CM | POA: Diagnosis not present

## 2018-09-01 DIAGNOSIS — D509 Iron deficiency anemia, unspecified: Secondary | ICD-10-CM | POA: Diagnosis not present

## 2018-09-01 DIAGNOSIS — K229 Disease of esophagus, unspecified: Secondary | ICD-10-CM | POA: Diagnosis not present

## 2018-09-01 DIAGNOSIS — K2270 Barrett's esophagus without dysplasia: Secondary | ICD-10-CM | POA: Diagnosis not present

## 2018-09-02 ENCOUNTER — Encounter: Payer: Self-pay | Admitting: Anesthesiology

## 2018-09-04 ENCOUNTER — Ambulatory Visit: Payer: PPO | Admitting: Anesthesiology

## 2018-09-04 ENCOUNTER — Encounter
Admission: RE | Disposition: A | Discharge status: Home / Self Care | Payer: Self-pay | Source: Home / Self Care | Attending: Unknown Physician Specialty

## 2018-09-04 ENCOUNTER — Ambulatory Visit
Admission: RE | Admit: 2018-09-04 | Discharge: 2018-09-04 | Disposition: A | Discharge status: Home / Self Care | Payer: PPO | Attending: Unknown Physician Specialty | Admitting: Unknown Physician Specialty

## 2018-09-04 DIAGNOSIS — D122 Benign neoplasm of ascending colon: Secondary | ICD-10-CM | POA: Insufficient documentation

## 2018-09-04 DIAGNOSIS — I10 Essential (primary) hypertension: Secondary | ICD-10-CM | POA: Insufficient documentation

## 2018-09-04 DIAGNOSIS — K640 First degree hemorrhoids: Secondary | ICD-10-CM | POA: Insufficient documentation

## 2018-09-04 DIAGNOSIS — D123 Benign neoplasm of transverse colon: Secondary | ICD-10-CM | POA: Diagnosis not present

## 2018-09-04 DIAGNOSIS — Z7982 Long term (current) use of aspirin: Secondary | ICD-10-CM | POA: Insufficient documentation

## 2018-09-04 DIAGNOSIS — Z79899 Other long term (current) drug therapy: Secondary | ICD-10-CM | POA: Insufficient documentation

## 2018-09-04 DIAGNOSIS — I2510 Atherosclerotic heart disease of native coronary artery without angina pectoris: Secondary | ICD-10-CM | POA: Diagnosis not present

## 2018-09-04 DIAGNOSIS — D509 Iron deficiency anemia, unspecified: Secondary | ICD-10-CM | POA: Insufficient documentation

## 2018-09-04 DIAGNOSIS — F17210 Nicotine dependence, cigarettes, uncomplicated: Secondary | ICD-10-CM | POA: Insufficient documentation

## 2018-09-04 DIAGNOSIS — K2270 Barrett's esophagus without dysplasia: Secondary | ICD-10-CM | POA: Diagnosis not present

## 2018-09-04 DIAGNOSIS — K210 Gastro-esophageal reflux disease with esophagitis: Secondary | ICD-10-CM | POA: Insufficient documentation

## 2018-09-04 DIAGNOSIS — G4730 Sleep apnea, unspecified: Secondary | ICD-10-CM | POA: Diagnosis not present

## 2018-09-04 DIAGNOSIS — K229 Disease of esophagus, unspecified: Secondary | ICD-10-CM | POA: Diagnosis not present

## 2018-09-04 DIAGNOSIS — K635 Polyp of colon: Secondary | ICD-10-CM | POA: Diagnosis not present

## 2018-09-04 DIAGNOSIS — K219 Gastro-esophageal reflux disease without esophagitis: Secondary | ICD-10-CM | POA: Diagnosis not present

## 2018-09-04 DIAGNOSIS — I739 Peripheral vascular disease, unspecified: Secondary | ICD-10-CM | POA: Diagnosis not present

## 2018-09-04 DIAGNOSIS — D126 Benign neoplasm of colon, unspecified: Secondary | ICD-10-CM | POA: Diagnosis not present

## 2018-09-04 DIAGNOSIS — M1990 Unspecified osteoarthritis, unspecified site: Secondary | ICD-10-CM | POA: Diagnosis not present

## 2018-09-04 DIAGNOSIS — Z791 Long term (current) use of non-steroidal anti-inflammatories (NSAID): Secondary | ICD-10-CM | POA: Insufficient documentation

## 2018-09-04 DIAGNOSIS — E785 Hyperlipidemia, unspecified: Secondary | ICD-10-CM | POA: Diagnosis not present

## 2018-09-04 HISTORY — PX: COLONOSCOPY WITH PROPOFOL: SHX5780

## 2018-09-04 HISTORY — PX: ESOPHAGOGASTRODUODENOSCOPY (EGD) WITH PROPOFOL: SHX5813

## 2018-09-04 HISTORY — DX: Gastro-esophageal reflux disease without esophagitis: K21.9

## 2018-09-04 SURGERY — ESOPHAGOGASTRODUODENOSCOPY (EGD) WITH PROPOFOL
Anesthesia: General

## 2018-09-04 MED ORDER — PIPERACILLIN-TAZOBACTAM 3.375 G IVPB 30 MIN
3.3750 g | Freq: Once | INTRAVENOUS | Status: AC
Start: 2018-09-04 — End: 2018-09-04
  Administered 2018-09-04: 3.375 g via INTRAVENOUS
  Filled 2018-09-04: qty 50

## 2018-09-04 MED ORDER — MIDAZOLAM HCL 2 MG/2ML IJ SOLN
INTRAMUSCULAR | Status: AC
  Filled 2018-09-04: qty 2

## 2018-09-04 MED ORDER — PROPOFOL 10 MG/ML IV BOLUS
INTRAVENOUS | Status: DC | PRN
  Administered 2018-09-04: 20 mg via INTRAVENOUS
  Administered 2018-09-04: 30 mg via INTRAVENOUS
  Administered 2018-09-04: 20 mg via INTRAVENOUS

## 2018-09-04 MED ORDER — SODIUM CHLORIDE 0.9 % IV SOLN
INTRAVENOUS | Status: DC
  Administered 2018-09-04: 1000 mL via INTRAVENOUS

## 2018-09-04 MED ORDER — FENTANYL CITRATE (PF) 100 MCG/2ML IJ SOLN
INTRAMUSCULAR | Status: DC | PRN
  Administered 2018-09-04 (×2): 50 ug via INTRAVENOUS

## 2018-09-04 MED ORDER — LIDOCAINE HCL (PF) 2 % IJ SOLN
INTRAMUSCULAR | Status: DC | PRN
  Administered 2018-09-04: 100 mg

## 2018-09-04 MED ORDER — PIPERACILLIN-TAZOBACTAM 3.375 G IVPB
INTRAVENOUS | Status: AC
  Filled 2018-09-04: qty 50

## 2018-09-04 MED ORDER — MIDAZOLAM HCL 5 MG/5ML IJ SOLN
INTRAMUSCULAR | Status: DC | PRN
  Administered 2018-09-04: 2 mg via INTRAVENOUS

## 2018-09-04 MED ORDER — EPHEDRINE SULFATE 50 MG/ML IJ SOLN
INTRAMUSCULAR | Status: DC | PRN
  Administered 2018-09-04 (×2): 10 mg via INTRAVENOUS

## 2018-09-04 MED ORDER — PROPOFOL 500 MG/50ML IV EMUL
INTRAVENOUS | Status: DC | PRN
  Administered 2018-09-04: 50 ug/kg/min via INTRAVENOUS

## 2018-09-04 MED ORDER — FENTANYL CITRATE (PF) 100 MCG/2ML IJ SOLN
INTRAMUSCULAR | Status: AC
  Filled 2018-09-04: qty 2

## 2018-09-04 MED ORDER — LIDOCAINE HCL (PF) 2 % IJ SOLN
INTRAMUSCULAR | Status: AC
Start: 2018-09-04 — End: ?
  Filled 2018-09-04: qty 10

## 2018-09-04 NOTE — Anesthesia Postprocedure Evaluation (Signed)
Anesthesia Post Note  Patient: Ivan Reyes  Procedure(s) Performed: ESOPHAGOGASTRODUODENOSCOPY (EGD) WITH PROPOFOL (N/A ) COLONOSCOPY WITH PROPOFOL (N/A )  Patient location during evaluation: Endoscopy Anesthesia Type: General Level of consciousness: awake and alert Pain management: pain level controlled Vital Signs Assessment: post-procedure vital signs reviewed and stable Respiratory status: spontaneous breathing, nonlabored ventilation, respiratory function stable and patient connected to nasal cannula oxygen Cardiovascular status: blood pressure returned to baseline and stable Postop Assessment: no apparent nausea or vomiting Anesthetic complications: no     Last Vitals:  Vitals:   09/04/18 1025 09/04/18 1035  BP: (!) 111/45 (!) 117/52  Pulse: 65 66  Resp: (!) 21 13  Temp:    SpO2: 100% 100%    Last Pain:  Vitals:   09/04/18 1035  TempSrc:   PainSc: 0-No pain                 Abbegail Matuska S

## 2018-09-04 NOTE — Anesthesia Post-op Follow-up Note (Signed)
Anesthesia QCDR form completed.        

## 2018-09-04 NOTE — Op Note (Signed)
[facility] Patient Name: Ivan Reyes Procedure Date: 09/04/2018 9:20 AM MRN: [id number] Account #: [id number] Date of Birth: [birth year] Admit Type: Outpatient Age: 73 Room: [facility] ENDO ROOM 1 Gender: Male Note Status: Finalized Procedure:            Colonoscopy Indications:          Unexplained iron deficiency anemia Providers:            Manya Silvas, MD Medicines:            Propofol per Anesthesia Complications:        No immediate complications. Procedure:            Pre-Anesthesia Assessment:                       - After reviewing the risks and benefits, the patient                        was deemed in satisfactory condition to undergo the                        procedure.                       After obtaining informed consent, the colonoscope was                        passed under direct vision. Throughout the procedure,                        the patient's blood pressure, pulse, and oxygen                        saturations were monitored continuously. The                        Colonoscope was introduced through the anus and                        advanced to the the cecum, identified by appendiceal                        orifice and ileocecal valve. The colonoscopy was                        performed without difficulty. The patient tolerated the                        procedure well. The quality of the bowel preparation                        was excellent. Findings:      A diminutive polyp was found in the ascending colon. The polyp was       sessile. The polyp was removed with a jumbo cold forceps. Resection and       retrieval were complete.      A diminutive polyp was found in the transverse colon. The polyp was       sessile. The polyp was removed with a jumbo cold forceps. Resection and       retrieval were complete.      Two sessile polyps were found  in the recto-sigmoid colon. The polyps       were diminutive in  size. These polyps were removed with a jumbo cold       forceps. Resection and retrieval were complete.      Internal hemorrhoids were found during endoscopy. The hemorrhoids were       small and Grade I (internal hemorrhoids that do not prolapse). Impression:           - One diminutive polyp in the ascending colon, removed                        with a jumbo cold forceps. Resected and retrieved.                       - One diminutive polyp in the transverse colon, removed                        with a jumbo cold forceps. Resected and retrieved.                       - Two diminutive polyps at the recto-sigmoid colon,                        removed with a jumbo cold forceps. Resected and                        retrieved.                       - Internal hemorrhoids. Recommendation:       - Await pathology results. Manya Silvas, MD 09/04/2018 10:08:02 AM This report has been signed electronically. Number of Addenda: 0 Note Initiated On: 09/04/2018 9:20 AM Scope Withdrawal Time: 0 hours 10 minutes 42 seconds  Total Procedure Duration: 0 hours 18 minutes 57 seconds       [facility]

## 2018-09-04 NOTE — H&P (Signed)
Primary Care Physician:  Baxter Hire, MD Primary Gastroenterologist:  Dr. Vira Agar  Pre-Procedure History & Physical: HPI:  Ivan Reyes is a 73 y.o. male is here for an endoscopy and colonoscopy.   Past Medical History:  Diagnosis Date  . Arthritis   . Coronary artery disease   . GERD (gastroesophageal reflux disease)   . Hyperlipidemia   . Hypertension   . Mitral valve disorder   . Peripheral vascular disease ([place])   . Sleep apnea    use C-PAP    Past Surgical History:  Procedure Laterality Date  . CAROTID ENDARTERECTOMY Bilateral    Dr. Delana Meyer, [facility]  . CARPAL TUNNEL RELEASE Left 12/23/2016   Procedure: CARPAL TUNNEL RELEASE ENDOSCOPIC;  Surgeon: Corky Mull, MD;  Location: [facility] ORS;  Service: Orthopedics;  Laterality: Left;  . CARPAL TUNNEL RELEASE Right 01/13/2017   Procedure: CARPAL TUNNEL RELEASE ENDOSCOPIC;  Surgeon: Corky Mull, MD;  Location: [facility] ORS;  Service: Orthopedics;  Laterality: Right;  . CORONARY ANGIOPLASTY     3 stents  . coronary atherosclerosis of  autologous graft    . ESOPHAGOGASTRODUODENOSCOPY (EGD) WITH PROPOFOL N/A 12/12/2017   Procedure: ESOPHAGOGASTRODUODENOSCOPY (EGD) WITH PROPOFOL;  Surgeon: Manya Silvas, MD;  Location: [facility] ENDOSCOPY;  Service: Endoscopy;  Laterality: N/A;  . EYE SURGERY Bilateral    Cataract Extraction with IOL  . FRACTURE SURGERY Right    Hip Pinning, Dr. Franchot Mimes, Jr  . JOINT REPLACEMENT     left knee  . KNEE ARTHROPLASTY Left 06/30/2016   Procedure: COMPUTER ASSISTED TOTAL KNEE ARTHROPLASTY;  Surgeon: Dereck Leep, MD;  Location: [facility] ORS;  Service: Orthopedics;  Laterality: Left;  Marland Kitchen MASS EXCISION Left 01/11/2018   Procedure: EXCISION CYSTIC MASS ANTERIOR KNEE;  Surgeon: Dereck Leep, MD;  Location: [facility] ORS;  Service: Orthopedics;  Laterality: Left;    Prior to Admission medications   Medication Sig Start Date End Date Taking? Authorizing Provider  Cholecalciferol (VITAMIN D) 2000 units  CAPS Take 2,000 Units by mouth every evening.   Yes [provider]  clopidogrel (PLAVIX) 75 MG tablet Take 75 mg by mouth daily.   Yes [provider]  enalapril (VASOTEC) 20 MG tablet Take 20 mg by mouth 2 (two) times daily.   Yes [provider]  fenofibrate micronized (LOFIBRA) 134 MG capsule Take 134 mg by mouth daily before breakfast.    Yes [provider]  Glucosamine HCl (GLUCOSAMINE PO) Take 1 tablet by mouth 2 (two) times daily.   Yes [provider]  ibuprofen (ADVIL,MOTRIN) 800 MG tablet Take 800 mg by mouth every 8 (eight) hours as needed for headache or moderate pain.    Yes [provider]  Omega-3 Fatty Acids (OMEGA-3 FISH OIL) 300 MG CAPS Take 300 mg by mouth 2 (two) times daily.   Yes [provider]  sildenafil (REVATIO) 20 MG tablet Take 60-100 mg by mouth daily as needed (for erectile dysfunction).   Yes [provider]  simvastatin (ZOCOR) 40 MG tablet Take 40 mg by mouth daily at 6 PM.    Yes [provider]  vitamin B-12 (CYANOCOBALAMIN) 1000 MCG tablet Take 1,000 mcg by mouth daily.   Yes [provider]  vitamin E 400 UNIT capsule Take 400 Units by mouth every evening.    Yes [provider]  aspirin 81 MG chewable tablet Chew 81 mg by mouth daily.    [provider]  hydrochlorothiazide (HYDRODIURIL) 12.5  MG tablet Take 12.5 mg by mouth daily.    [provider]    Allergies as of 09/01/2018  . (No Known Allergies)    History reviewed. No pertinent family history.  Social History   Socioeconomic History  . Marital status: Widowed    Spouse name: Not on file  . Number of children: Not on file  . Years of education: Not on file  . Highest education level: Not on file  Occupational History  . Not on file  Social Needs  . Financial resource strain: Not on file  . Food insecurity:    Worry: Not on file    Inability: Not on file  .  Transportation needs:    Medical: Not on file    Non-medical: Not on file  Tobacco Use  . Smoking status: Current Every Day Smoker    Packs/day: 0.50    Types: Cigarettes  . Smokeless tobacco: Never Used  Substance and Sexual Activity  . Alcohol use: Yes    Alcohol/week: 2.0 standard drinks    Types: 2 Cans of beer per week    Comment: 4/ week  . Drug use: No  . Sexual activity: Not on file  Lifestyle  . Physical activity:    Days per week: Not on file    Minutes per session: Not on file  . Stress: Not on file  Relationships  . Social connections:    Talks on phone: Not on file    Gets together: Not on file    Attends religious service: Not on file    Active member of club or organization: Not on file    Attends meetings of clubs or organizations: Not on file    Relationship status: Not on file  . Intimate partner violence:    Fear of current or ex partner: Not on file    Emotionally abused: Not on file    Physically abused: Not on file    Forced sexual activity: Not on file  Other Topics Concern  . Not on file  Social History Narrative  . Not on file    Review of Systems: See HPI, otherwise negative ROS  Physical Exam: BP 131/84   Pulse 61   Temp (!) 96.7 F (35.9 C) (Tympanic)   Resp 20   Ht 5\' 10"  (1.778 m)   Wt 79.4 kg   SpO2 99%   BMI 25.11 kg/m  General:   Alert,  pleasant and cooperative in NAD Head:  Normocephalic and atraumatic. Neck:  Supple; no masses or thyromegaly. Lungs:  Clear throughout to auscultation.    Heart:  Regular rate and rhythm. Abdomen:  Soft, nontender and nondistended. Normal bowel sounds, without guarding, and without rebound.   Neurologic:  Alert and  oriented x4;  grossly normal neurologically.  Impression/Plan: Ivan Reyes is here for an endoscopy and colonoscopy to be performed for dysphagia and colonoscopy.  The colon was done 11/22/2008 and no polyps found.  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  Risks, benefits, limitations, and alternatives regarding  endoscopy and colonoscopy have been reviewed with the patient.  Questions have been answered.  All parties agreeable.   Gaylyn Cheers, MD  09/04/2018, 9:17 AM

## 2018-09-04 NOTE — Anesthesia Preprocedure Evaluation (Signed)
Anesthesia Evaluation  Patient identified by MRN, date of birth, ID band Patient awake    Reviewed: Allergy & Precautions, NPO status , Patient's Chart, lab work & pertinent test results, reviewed documented beta blocker date and time   Airway Mallampati: II  TM Distance: >3 FB     Dental  (+) Chipped   Pulmonary sleep apnea and Continuous Positive Airway Pressure Ventilation , Current Smoker,           Cardiovascular hypertension, Pt. on medications + CAD and + Peripheral Vascular Disease       Neuro/Psych    GI/Hepatic GERD  ,  Endo/Other    Renal/GU      Musculoskeletal  (+) Arthritis ,   Abdominal   Peds  Hematology   Anesthesia Other Findings 3 cardiac stents. Smokes. EKG ok.  Reproductive/Obstetrics                             Anesthesia Physical Anesthesia Plan  ASA: III  Anesthesia Plan: General   Post-op Pain Management:    Induction: Intravenous  PONV Risk Score and Plan:   Airway Management Planned:   Additional Equipment:   Intra-op Plan:   Post-operative Plan:   Informed Consent: I have reviewed the patients History and Physical, chart, labs and discussed the procedure including the risks, benefits and alternatives for the proposed anesthesia with the patient or authorized representative who has indicated his/her understanding and acceptance.       Plan Discussed with: CRNA  Anesthesia Plan Comments:         Anesthesia Quick Evaluation

## 2018-09-04 NOTE — Transfer of Care (Signed)
Immediate Anesthesia Transfer of Care Note  Patient: Ivan Reyes  Procedure(s) Performed: ESOPHAGOGASTRODUODENOSCOPY (EGD) WITH PROPOFOL (N/A ) COLONOSCOPY WITH PROPOFOL (N/A )  Patient Location: PACU  Anesthesia Type:General  Level of Consciousness: sedated  Airway & Oxygen Therapy: Patient Spontanous Breathing and Patient connected to nasal cannula oxygen  Post-op Assessment: Report given to RN and Post -op Vital signs reviewed and stable  Post vital signs: Reviewed and stable  Last Vitals:  Vitals Value Taken Time  BP 96/52 09/04/2018 10:06 AM  Temp    Pulse 64 09/04/2018 10:06 AM  Resp 19 09/04/2018 10:06 AM  SpO2 100 % 09/04/2018 10:06 AM  Vitals shown include unvalidated device data.  Last Pain:  Vitals:   09/04/18 0855  TempSrc: Tympanic  PainSc: 0-No pain         Complications: No apparent anesthesia complications

## 2018-09-04 NOTE — Op Note (Signed)
[facility] Patient Name: Ivan Reyes Procedure Date: 09/04/2018 9:21 AM MRN: [id number] Account #: [id number] Date of Birth: 09/22/1945 Admit Type: Outpatient Age: 73 Room: [facility] ENDO ROOM 1 Gender: Male Note Status: Finalized Procedure:            Upper GI endoscopy Indications:          Dysphagia, Follow-up of Barrett's esophagus Providers:            Manya Silvas, MD Medicines:            Propofol per Anesthesia Complications:        No immediate complications. Procedure:            Pre-Anesthesia Assessment:                       - After reviewing the risks and benefits, the patient                        was deemed in satisfactory condition to undergo the                        procedure.                       After obtaining informed consent, the endoscope was                        passed under direct vision. Throughout the procedure,                        the patient's blood pressure, pulse, and oxygen                        saturations were monitored continuously. The Endoscope                        was introduced through the mouth, and advanced to the                        second part of duodenum. The upper GI endoscopy was                        accomplished without difficulty. The patient tolerated                        the procedure well. Findings:      There were esophageal mucosal changes consistent with short-segment       Barrett's esophagus present in the distal esophagus. The maximum       longitudinal extent of these mucosal changes was 2 cm in length. Mucosa       was biopsied with a cold forceps for histology. One specimen bottle was       sent to pathology.      Diffuse mild mucosal changes characterized by granularity were found in       the middle third of the esophagus and in the lower third of the       esophagus. Biopsies were taken with a cold forceps for histology.      The patient has a diagnosis of  "jackhammer esophagus" and this was       clearly seen where the gastro esophageal  area was seen at the GEJ. Impression:           - Esophageal mucosal changes consistent with                        short-segment Barrett's esophagus. Biopsied.                       - Granular mucosa in the esophagus. Biopsied. Recommendation:       - Await pathology results. Manya Silvas, MD 09/04/2018 9:40:05 AM This report has been signed electronically. Number of Addenda: 0 Note Initiated On: 09/04/2018 9:21 AM      [facility]

## 2018-09-05 ENCOUNTER — Encounter: Payer: Self-pay | Admitting: Unknown Physician Specialty

## 2018-09-06 LAB — SURGICAL PATHOLOGY

## 2018-09-10 DIAGNOSIS — D649 Anemia, unspecified: Secondary | ICD-10-CM | POA: Insufficient documentation

## 2018-09-10 NOTE — Progress Notes (Signed)
[place]  [phone] [phone]  ID: [name] OB: 06-12-1946  MR#: [id number]  CBS#:[id number]  Patient Care Team: Baxter Hire, MD as PCP - General (Internal Medicine)  CHIEF COMPLAINT: Iron deficiency anemia.  INTERVAL HISTORY: Patient is a 73 year old male who was found to have severe iron deficiency anemia.  He underwent colonoscopy and upper endoscopy on September 04, 2018 with out definitive source of bleeding.  He has noticed some increased weakness fatigue, but otherwise feels well.  He has no neurologic complaints.  He denies any recent fevers or illnesses.  He has a good appetite and denies weight loss.  He has no chest pain or shortness of breath.  He denies any nausea, vomiting, constipation, or diarrhea.  He has no melena or hematochezia.  He has no urinary complaints.  Patient otherwise feels well and offers no further specific complaints today.  REVIEW OF SYSTEMS:   Review of Systems  Constitutional: Positive for malaise/fatigue. Negative for fever and weight loss.  Respiratory: Negative.  Negative for cough and shortness of breath.   Cardiovascular: Negative.  Negative for chest pain and leg swelling.  Gastrointestinal: Negative.  Negative for abdominal pain, blood in stool and melena.  Genitourinary: Negative.  Negative for dysuria and hematuria.  Musculoskeletal: Negative.  Negative for back pain.  Skin: Negative.  Negative for rash.  Neurological: Negative.  Negative for focal weakness, weakness and headaches.  Psychiatric/Behavioral: Negative.  The patient is not nervous/anxious.     As per HPI. Otherwise, a complete review of systems is negative.  PAST MEDICAL HISTORY: Past Medical History:  Diagnosis Date  . Arthritis   . Coronary artery disease   . GERD (gastroesophageal reflux disease)   . Hyperlipidemia   . Hypertension   . Mitral valve disorder   . Peripheral vascular disease ([place])   . Sleep apnea     use C-PAP    PAST SURGICAL HISTORY: Past Surgical History:  Procedure Laterality Date  . CAROTID ENDARTERECTOMY Bilateral    Dr. Delana Meyer, [facility]  . CARPAL TUNNEL RELEASE Left 12/23/2016   Procedure: CARPAL TUNNEL RELEASE ENDOSCOPIC;  Surgeon: Corky Mull, MD;  Location: [facility] ORS;  Service: Orthopedics;  Laterality: Left;  . CARPAL TUNNEL RELEASE Right 01/13/2017   Procedure: CARPAL TUNNEL RELEASE ENDOSCOPIC;  Surgeon: Corky Mull, MD;  Location: [facility] ORS;  Service: Orthopedics;  Laterality: Right;  . COLONOSCOPY WITH PROPOFOL N/A 09/04/2018   Procedure: COLONOSCOPY WITH PROPOFOL;  Surgeon: Manya Silvas, MD;  Location: [facility] ENDOSCOPY;  Service: Endoscopy;  Laterality: N/A;  . CORONARY ANGIOPLASTY     3 stents  . coronary atherosclerosis of  autologous graft    . ESOPHAGOGASTRODUODENOSCOPY (EGD) WITH PROPOFOL N/A 12/12/2017   Procedure: ESOPHAGOGASTRODUODENOSCOPY (EGD) WITH PROPOFOL;  Surgeon: Manya Silvas, MD;  Location: [facility] ENDOSCOPY;  Service: Endoscopy;  Laterality: N/A;  . ESOPHAGOGASTRODUODENOSCOPY (EGD) WITH PROPOFOL N/A 09/04/2018   Procedure: ESOPHAGOGASTRODUODENOSCOPY (EGD) WITH PROPOFOL;  Surgeon: Manya Silvas, MD;  Location: [facility] ENDOSCOPY;  Service: Endoscopy;  Laterality: N/A;  . EYE SURGERY Bilateral    Cataract Extraction with IOL  . FRACTURE SURGERY Right    Hip Pinning, Dr. Franchot Mimes, Jr  . JOINT REPLACEMENT     left knee  . KNEE ARTHROPLASTY Left 06/30/2016   Procedure: COMPUTER ASSISTED TOTAL KNEE ARTHROPLASTY;  Surgeon: Dereck Leep, MD;  Location: [facility] ORS;  Service: Orthopedics;  Laterality: Left;  Marland Kitchen MASS EXCISION Left 01/11/2018   Procedure: EXCISION  CYSTIC MASS ANTERIOR KNEE;  Surgeon: Dereck Leep, MD;  Location: [facility] ORS;  Service: Orthopedics;  Laterality: Left;    FAMILY HISTORY: No family history on file.  ADVANCED DIRECTIVES (Y/N):  N  HEALTH MAINTENANCE: Social History   Tobacco Use  . Smoking status: Current Every Day  Smoker    Packs/day: 0.50    Types: Cigarettes  . Smokeless tobacco: Never Used  Substance Use Topics  . Alcohol use: Yes    Alcohol/week: 2.0 standard drinks    Types: 2 Cans of beer per week    Comment: 4/ week  . Drug use: No     Colonoscopy:  PAP:  Bone density:  Lipid panel:  No Known Allergies  Current Outpatient Medications  Medication Sig Dispense Refill  . Cholecalciferol (VITAMIN D) 2000 units CAPS Take 2,000 Units by mouth every evening.    . clopidogrel (PLAVIX) 75 MG tablet Take 75 mg by mouth daily.    . enalapril (VASOTEC) 20 MG tablet Take 20 mg by mouth 2 (two) times daily.    . fenofibrate micronized (LOFIBRA) 134 MG capsule Take 134 mg by mouth daily before breakfast.     . Glucosamine HCl (GLUCOSAMINE PO) Take 1 tablet by mouth 2 (two) times daily.    Marland Kitchen ibuprofen (ADVIL,MOTRIN) 800 MG tablet Take 800 mg by mouth every 8 (eight) hours as needed for headache or moderate pain.     . Multiple Vitamins-Minerals (PX COMPLETE SENIOR MULTIVITS) TABS Take 1 tablet by mouth.    . Omega-3 Fatty Acids (OMEGA-3 FISH OIL) 300 MG CAPS Take 300 mg by mouth 2 (two) times daily.    . pantoprazole (PROTONIX) 20 MG tablet Take 1 tablet by mouth.    . sildenafil (REVATIO) 20 MG tablet Take 60-100 mg by mouth daily as needed (for erectile dysfunction).    . simvastatin (ZOCOR) 40 MG tablet Take 40 mg by mouth daily at 6 PM.     . vitamin B-12 (CYANOCOBALAMIN) 1000 MCG tablet Take 1,000 mcg by mouth daily.    . vitamin E 400 UNIT capsule Take 400 Units by mouth every evening.      No current facility-administered medications for this visit.     OBJECTIVE: Vitals:   09/12/18 1125  BP: (!) 146/80  Pulse: 70  Temp: 98.7 F (37.1 C)     Body mass index is 26.7 kg/m.    ECOG FS:0 - Asymptomatic  General: Well-developed, well-nourished, no acute distress. Eyes: Pink conjunctiva, anicteric sclera. HEENT: Normocephalic, moist mucous membranes, clear oropharnyx. Lungs: Clear to  auscultation bilaterally. Heart: Regular rate and rhythm. No rubs, murmurs, or gallops. Abdomen: Soft, nontender, nondistended. No organomegaly noted, normoactive bowel sounds. Musculoskeletal: No edema, cyanosis, or clubbing. Neuro: Alert, answering all questions appropriately. Cranial nerves grossly intact. Skin: No rashes or petechiae noted. Psych: Normal affect. Lymphatics: No cervical, calvicular, axillary or inguinal LAD.   LAB RESULTS:  Lab Results  Component Value Date   NA 137 12/30/2017   K 4.0 12/30/2017   CL 105 12/30/2017   CO2 24 12/30/2017   GLUCOSE 89 12/30/2017   BUN 17 12/30/2017   CREATININE 0.85 12/30/2017   CALCIUM 9.4 12/30/2017   PROT 7.5 06/16/2016   ALBUMIN 4.4 06/16/2016   AST 26 06/16/2016   ALT 16 (L) 06/16/2016   ALKPHOS 39 06/16/2016   BILITOT 0.6 06/16/2016   GFRNONAA >60 12/30/2017   GFRAA >60 12/30/2017    Lab Results  Component Value Date   WBC  6.8 12/30/2017   NEUTROABS 4.5 12/30/2017   HGB 12.7 (L) 12/30/2017   HCT 37.0 (L) 12/30/2017   MCV 91.5 12/30/2017   PLT 229 12/30/2017     STUDIES: No results found.  ASSESSMENT: Iron deficiency anemia.  PLAN:   1.  Iron deficiency anemia: Patient's most recent hemoglobin on September 01, 2018 was reported at 8.2.  He had a total iron of 13, and a percent saturation of 3%.  Patient has no obvious evidence of bleeding no definitive source noted on colonoscopy and upper endoscopy on September 04, 2018.  Patient will return later this week to receive 1 infusion of 510 mg IV Feraheme.  He was then return to clinic next week for second infusion.  He will return to clinic in 3 months with repeat laboratory work and further evaluation.  Will consider full anemia work-up at that time if there is no improvement of his hemoglobin.  I spent a total of 45 minutes face-to-face with the patient of which greater than 50% of the visit was spent in counseling and coordination of care as detailed  above.  Patient expressed understanding and was in agreement with this plan. He also understands that He can call clinic at any time with any questions, concerns, or complaints.    Lloyd Huger, MD   09/13/2018 9:11 AM

## 2018-09-12 ENCOUNTER — Inpatient Hospital Stay: Payer: PPO | Attending: Oncology | Admitting: Oncology

## 2018-09-12 ENCOUNTER — Encounter (INDEPENDENT_AMBULATORY_CARE_PROVIDER_SITE_OTHER): Payer: Self-pay

## 2018-09-12 ENCOUNTER — Other Ambulatory Visit: Payer: Self-pay

## 2018-09-12 VITALS — BP 146/80 | HR 70 | Temp 98.7°F | Ht 70.0 in | Wt 186.1 lb

## 2018-09-12 DIAGNOSIS — Z79899 Other long term (current) drug therapy: Secondary | ICD-10-CM | POA: Insufficient documentation

## 2018-09-12 DIAGNOSIS — F17210 Nicotine dependence, cigarettes, uncomplicated: Secondary | ICD-10-CM | POA: Diagnosis not present

## 2018-09-12 DIAGNOSIS — I10 Essential (primary) hypertension: Secondary | ICD-10-CM | POA: Diagnosis not present

## 2018-09-12 DIAGNOSIS — D509 Iron deficiency anemia, unspecified: Secondary | ICD-10-CM | POA: Insufficient documentation

## 2018-09-12 NOTE — Progress Notes (Signed)
Patient is here today to establish care for his anemia. 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

## 2018-09-15 ENCOUNTER — Inpatient Hospital Stay: Payer: PPO

## 2018-09-15 VITALS — BP 156/80 | HR 76 | Resp 20

## 2018-09-15 DIAGNOSIS — D509 Iron deficiency anemia, unspecified: Secondary | ICD-10-CM

## 2018-09-15 MED ORDER — SODIUM CHLORIDE 0.9 % IV SOLN
Freq: Once | INTRAVENOUS | Status: AC
  Administered 2018-09-15: 14:00:00 via INTRAVENOUS
  Filled 2018-09-15: qty 250

## 2018-09-15 MED ORDER — SODIUM CHLORIDE 0.9 % IV SOLN
510.0000 mg | Freq: Once | INTRAVENOUS | Status: AC
  Administered 2018-09-15: 510 mg via INTRAVENOUS
  Filled 2018-09-15: qty 17

## 2018-09-21 ENCOUNTER — Inpatient Hospital Stay: Payer: PPO | Attending: Oncology

## 2018-09-21 VITALS — BP 158/72 | HR 59 | Resp 20

## 2018-09-21 DIAGNOSIS — D509 Iron deficiency anemia, unspecified: Secondary | ICD-10-CM | POA: Diagnosis not present

## 2018-09-21 MED ORDER — SODIUM CHLORIDE 0.9 % IV SOLN
510.0000 mg | Freq: Once | INTRAVENOUS | Status: AC
  Administered 2018-09-21: 510 mg via INTRAVENOUS
  Filled 2018-09-21: qty 17

## 2018-09-21 MED ORDER — SODIUM CHLORIDE 0.9 % IV SOLN
Freq: Once | INTRAVENOUS | Status: AC
  Administered 2018-09-21: 14:00:00 via INTRAVENOUS
  Filled 2018-09-21: qty 250

## 2018-09-25 DIAGNOSIS — D509 Iron deficiency anemia, unspecified: Secondary | ICD-10-CM | POA: Diagnosis not present

## 2018-10-20 DIAGNOSIS — D509 Iron deficiency anemia, unspecified: Secondary | ICD-10-CM | POA: Diagnosis not present

## 2018-10-20 DIAGNOSIS — Z86010 Personal history of colonic polyps: Secondary | ICD-10-CM | POA: Diagnosis not present

## 2018-10-20 DIAGNOSIS — K229 Disease of esophagus, unspecified: Secondary | ICD-10-CM | POA: Diagnosis not present

## 2018-10-20 DIAGNOSIS — K2270 Barrett's esophagus without dysplasia: Secondary | ICD-10-CM | POA: Diagnosis not present

## 2018-12-04 DIAGNOSIS — I10 Essential (primary) hypertension: Secondary | ICD-10-CM | POA: Diagnosis not present

## 2018-12-07 DIAGNOSIS — E782 Mixed hyperlipidemia: Secondary | ICD-10-CM | POA: Diagnosis not present

## 2018-12-07 DIAGNOSIS — G4733 Obstructive sleep apnea (adult) (pediatric): Secondary | ICD-10-CM | POA: Diagnosis not present

## 2018-12-07 DIAGNOSIS — I2510 Atherosclerotic heart disease of native coronary artery without angina pectoris: Secondary | ICD-10-CM | POA: Diagnosis not present

## 2018-12-07 DIAGNOSIS — Z0000 Encounter for general adult medical examination without abnormal findings: Secondary | ICD-10-CM | POA: Diagnosis not present

## 2018-12-07 DIAGNOSIS — I10 Essential (primary) hypertension: Secondary | ICD-10-CM | POA: Diagnosis not present

## 2018-12-07 DIAGNOSIS — F17210 Nicotine dependence, cigarettes, uncomplicated: Secondary | ICD-10-CM | POA: Diagnosis not present

## 2018-12-07 DIAGNOSIS — K2270 Barrett's esophagus without dysplasia: Secondary | ICD-10-CM | POA: Diagnosis not present

## 2018-12-07 DIAGNOSIS — Z720 Tobacco use: Secondary | ICD-10-CM | POA: Diagnosis not present

## 2018-12-08 ENCOUNTER — Other Ambulatory Visit: Payer: PPO

## 2018-12-08 ENCOUNTER — Ambulatory Visit: Payer: PPO

## 2018-12-08 ENCOUNTER — Ambulatory Visit: Payer: PPO | Admitting: Oncology

## 2019-03-04 NOTE — Progress Notes (Signed)
[place]  [phone] [phone]  ID: Ivan Reyes OB: [birth year]  MR#: [id number]  MCN#:[id number]  Patient Care Team: Baxter Hire, MD as PCP - General (Internal Medicine)  CHIEF COMPLAINT: Iron deficiency anemia.  INTERVAL HISTORY: Patient returns to clinic today for repeat laboratory work and further evaluation.  He feels significantly improved after receiving IV Feraheme several months ago.  He currently feels well and is asymptomatic.  He has no neurologic complaints.  He denies any recent fevers or illnesses.  He has a good appetite and denies weight loss.  He denies any chest pain, shortness of breath, cough, or hemoptysis.  He denies any nausea, vomiting, constipation, or diarrhea.  He has no melena or hematochezia.  He has no urinary complaints.  Patient feels at his baseline offers no specific complaints today.  REVIEW OF SYSTEMS:   Review of Systems  Constitutional: Negative.  Negative for fever, malaise/fatigue and weight loss.  Respiratory: Negative.  Negative for cough and shortness of breath.   Cardiovascular: Negative.  Negative for chest pain and leg swelling.  Gastrointestinal: Negative.  Negative for abdominal pain, blood in stool and melena.  Genitourinary: Negative.  Negative for dysuria and hematuria.  Musculoskeletal: Negative.  Negative for back pain.  Skin: Negative.  Negative for rash.  Neurological: Negative.  Negative for dizziness, focal weakness, weakness and headaches.  Psychiatric/Behavioral: Negative.  The patient is not nervous/anxious.     As per HPI. Otherwise, a complete review of systems is negative.  PAST MEDICAL HISTORY: Past Medical History:  Diagnosis Date  . Arthritis   . Coronary artery disease   . GERD (gastroesophageal reflux disease)   . Hyperlipidemia   . Hypertension   . Mitral valve disorder   . Peripheral vascular disease ([place])   . Sleep apnea    use C-PAP    PAST  SURGICAL HISTORY: Past Surgical History:  Procedure Laterality Date  . CAROTID ENDARTERECTOMY Bilateral    Dr. Delana Meyer, [facility]  . CARPAL TUNNEL RELEASE Left 12/23/2016   Procedure: CARPAL TUNNEL RELEASE ENDOSCOPIC;  Surgeon: Corky Mull, MD;  Location: [facility] ORS;  Service: Orthopedics;  Laterality: Left;  . CARPAL TUNNEL RELEASE Right 01/13/2017   Procedure: CARPAL TUNNEL RELEASE ENDOSCOPIC;  Surgeon: Corky Mull, MD;  Location: [facility] ORS;  Service: Orthopedics;  Laterality: Right;  . COLONOSCOPY WITH PROPOFOL N/A 09/04/2018   Procedure: COLONOSCOPY WITH PROPOFOL;  Surgeon: Manya Silvas, MD;  Location: [facility] ENDOSCOPY;  Service: Endoscopy;  Laterality: N/A;  . CORONARY ANGIOPLASTY     3 stents  . coronary atherosclerosis of  autologous graft    . ESOPHAGOGASTRODUODENOSCOPY (EGD) WITH PROPOFOL N/A 12/12/2017   Procedure: ESOPHAGOGASTRODUODENOSCOPY (EGD) WITH PROPOFOL;  Surgeon: Manya Silvas, MD;  Location: [facility] ENDOSCOPY;  Service: Endoscopy;  Laterality: N/A;  . ESOPHAGOGASTRODUODENOSCOPY (EGD) WITH PROPOFOL N/A 09/04/2018   Procedure: ESOPHAGOGASTRODUODENOSCOPY (EGD) WITH PROPOFOL;  Surgeon: Manya Silvas, MD;  Location: [facility] ENDOSCOPY;  Service: Endoscopy;  Laterality: N/A;  . EYE SURGERY Bilateral    Cataract Extraction with IOL  . FRACTURE SURGERY Right    Hip Pinning, Dr. Franchot Mimes, Jr  . JOINT REPLACEMENT     left knee  . KNEE ARTHROPLASTY Left 06/30/2016   Procedure: COMPUTER ASSISTED TOTAL KNEE ARTHROPLASTY;  Surgeon: Dereck Leep, MD;  Location: [facility] ORS;  Service: Orthopedics;  Laterality: Left;  Marland Kitchen MASS EXCISION Left 01/11/2018   Procedure: EXCISION CYSTIC MASS ANTERIOR KNEE;  Surgeon: Dereck Leep,  MD;  Location: [facility] ORS;  Service: Orthopedics;  Laterality: Left;    FAMILY HISTORY: No family history on file.  ADVANCED DIRECTIVES (Y/N):  N  HEALTH MAINTENANCE: Social History   Tobacco Use  . Smoking status: Current Every Day Smoker    Packs/day:  0.50    Types: Cigarettes  . Smokeless tobacco: Never Used  Substance Use Topics  . Alcohol use: Yes    Alcohol/week: 2.0 standard drinks    Types: 2 Cans of beer per week    Comment: 4/ week  . Drug use: No     Colonoscopy:  PAP:  Bone density:  Lipid panel:  No Known Allergies  Current Outpatient Medications  Medication Sig Dispense Refill  . Cholecalciferol (VITAMIN D) 2000 units CAPS Take 2,000 Units by mouth every evening.    . clopidogrel (PLAVIX) 75 MG tablet Take 75 mg by mouth daily.    . enalapril (VASOTEC) 20 MG tablet Take 20 mg by mouth 2 (two) times daily.    . fenofibrate micronized (LOFIBRA) 134 MG capsule Take 134 mg by mouth daily before breakfast.     . Glucosamine HCl (GLUCOSAMINE PO) Take 1 tablet by mouth 2 (two) times daily.    . Multiple Vitamins-Minerals (PX COMPLETE SENIOR MULTIVITS) TABS Take 1 tablet by mouth.    . Omega-3 Fatty Acids (OMEGA-3 FISH OIL) 300 MG CAPS Take 300 mg by mouth 2 (two) times daily.    . pantoprazole (PROTONIX) 20 MG tablet Take 1 tablet by mouth.    . sildenafil (REVATIO) 20 MG tablet Take 60-100 mg by mouth daily as needed (for erectile dysfunction).    . simvastatin (ZOCOR) 40 MG tablet Take 40 mg by mouth daily at 6 PM.     . vitamin B-12 (CYANOCOBALAMIN) 1000 MCG tablet Take 1,000 mcg by mouth daily.    . vitamin E 400 UNIT capsule Take 400 Units by mouth every evening.     Marland Kitchen ibuprofen (ADVIL,MOTRIN) 800 MG tablet Take 800 mg by mouth every 8 (eight) hours as needed for headache or moderate pain.      No current facility-administered medications for this visit.     OBJECTIVE: Vitals:   03/09/19 1258  BP: (!) 153/77  Pulse: (!) 58  Resp: 18  Temp: (!) 97.1 F (36.2 C)     Body mass index is 24.79 kg/m.    ECOG FS:0 - Asymptomatic  General: Well-developed, well-nourished, no acute distress. Eyes: Pink conjunctiva, anicteric sclera. HEENT: Normocephalic, moist mucous membranes. Lungs: Clear to auscultation  bilaterally. Heart: Regular rate and rhythm. No rubs, murmurs, or gallops. Abdomen: Soft, nontender, nondistended. No organomegaly noted, normoactive bowel sounds. Musculoskeletal: No edema, cyanosis, or clubbing. Neuro: Alert, answering all questions appropriately. Cranial nerves grossly intact. Skin: No rashes or petechiae noted. Psych: Normal affect.  LAB RESULTS:  Lab Results  Component Value Date   NA 137 12/30/2017   K 4.0 12/30/2017   CL 105 12/30/2017   CO2 24 12/30/2017   GLUCOSE 89 12/30/2017   BUN 17 12/30/2017   CREATININE 0.85 12/30/2017   CALCIUM 9.4 12/30/2017   PROT 7.5 06/16/2016   ALBUMIN 4.4 06/16/2016   AST 26 06/16/2016   ALT 16 (L) 06/16/2016   ALKPHOS 39 06/16/2016   BILITOT 0.6 06/16/2016   GFRNONAA >60 12/30/2017   GFRAA >60 12/30/2017    Lab Results  Component Value Date   WBC 8.4 03/09/2019   NEUTROABS 6.1 03/09/2019   HGB 14.0 03/09/2019   HCT  41.6 03/09/2019   MCV 91.2 03/09/2019   PLT 219 03/09/2019   Lab Results  Component Value Date   IRON 44 (L) 03/09/2019   TIBC 378 03/09/2019   IRONPCTSAT 12 (L) 03/09/2019   Lab Results  Component Value Date   FERRITIN 15 (L) 03/09/2019     STUDIES: No results found.  ASSESSMENT: Iron deficiency anemia.  PLAN:   1.  Iron deficiency anemia: Significantly improved.  Patient's hemoglobin is now within normal limits.  His iron stores remain decreased, but are also significantly improved.  He is no longer symptomatic. Colonoscopy and upper endoscopy on September 04, 2018 did not reveal any significant pathology.  He does not require additional Feraheme today.  Return to clinic in 4 months with repeat laboratory work, further evaluation, and consideration of additional IV Feraheme.  I spent a total of 20 minutes face-to-face with the patient of which greater than 50% of the visit was spent in counseling and coordination of care as detailed above.   Patient expressed understanding and was in  agreement with this plan. He also understands that He can call clinic at any time with any questions, concerns, or complaints.    Lloyd Huger, MD   03/11/2019 8:52 AM

## 2019-03-08 ENCOUNTER — Other Ambulatory Visit: Payer: Self-pay

## 2019-03-09 ENCOUNTER — Other Ambulatory Visit: Payer: Self-pay

## 2019-03-09 ENCOUNTER — Inpatient Hospital Stay: Payer: PPO | Attending: Oncology

## 2019-03-09 ENCOUNTER — Inpatient Hospital Stay: Payer: PPO

## 2019-03-09 ENCOUNTER — Inpatient Hospital Stay (HOSPITAL_BASED_OUTPATIENT_CLINIC_OR_DEPARTMENT_OTHER): Payer: PPO | Admitting: Oncology

## 2019-03-09 VITALS — BP 153/77 | HR 58 | Temp 97.1°F | Resp 18 | Wt 172.8 lb

## 2019-03-09 DIAGNOSIS — F17210 Nicotine dependence, cigarettes, uncomplicated: Secondary | ICD-10-CM | POA: Diagnosis not present

## 2019-03-09 DIAGNOSIS — I10 Essential (primary) hypertension: Secondary | ICD-10-CM | POA: Diagnosis not present

## 2019-03-09 DIAGNOSIS — Z79899 Other long term (current) drug therapy: Secondary | ICD-10-CM | POA: Diagnosis not present

## 2019-03-09 DIAGNOSIS — D509 Iron deficiency anemia, unspecified: Secondary | ICD-10-CM

## 2019-03-09 DIAGNOSIS — G4730 Sleep apnea, unspecified: Secondary | ICD-10-CM | POA: Insufficient documentation

## 2019-03-09 DIAGNOSIS — E785 Hyperlipidemia, unspecified: Secondary | ICD-10-CM | POA: Diagnosis not present

## 2019-03-09 LAB — CBC WITH DIFFERENTIAL/PLATELET
Abs Immature Granulocytes: 0.03 10*3/uL (ref 0.00–0.07)
Basophils Absolute: 0.1 10*3/uL (ref 0.0–0.1)
Basophils Relative: 1 %
Eosinophils Absolute: 0.2 10*3/uL (ref 0.0–0.5)
Eosinophils Relative: 2 %
HCT: 41.6 % (ref 39.0–52.0)
Hemoglobin: 14 g/dL (ref 13.0–17.0)
Immature Granulocytes: 0 %
Lymphocytes Relative: 18 %
Lymphs Abs: 1.5 10*3/uL (ref 0.7–4.0)
MCH: 30.7 pg (ref 26.0–34.0)
MCHC: 33.7 g/dL (ref 30.0–36.0)
MCV: 91.2 fL (ref 80.0–100.0)
Monocytes Absolute: 0.5 10*3/uL (ref 0.1–1.0)
Monocytes Relative: 6 %
Neutro Abs: 6.1 10*3/uL (ref 1.7–7.7)
Neutrophils Relative %: 73 %
Platelets: 219 10*3/uL (ref 150–400)
RBC: 4.56 MIL/uL (ref 4.22–5.81)
RDW: 14 % (ref 11.5–15.5)
WBC: 8.4 10*3/uL (ref 4.0–10.5)
nRBC: 0 % (ref 0.0–0.2)

## 2019-03-09 LAB — IRON AND TIBC
Iron: 44 ug/dL — ABNORMAL LOW (ref 45–182)
Saturation Ratios: 12 % — ABNORMAL LOW (ref 17.9–39.5)
TIBC: 378 ug/dL (ref 250–450)
UIBC: 334 ug/dL

## 2019-03-09 LAB — FOLATE: Folate: 34 ng/mL (ref >5.9)

## 2019-03-09 LAB — LACTATE DEHYDROGENASE: LDH: 105 U/L (ref 98–192)

## 2019-03-09 LAB — FERRITIN: Ferritin: 15 ng/mL — ABNORMAL LOW (ref 24–336)

## 2019-03-09 LAB — VITAMIN B12: Vitamin B-12: 947 pg/mL — ABNORMAL HIGH (ref 180–914)

## 2019-03-09 NOTE — Progress Notes (Signed)
Per Dr. Gary Fleet checkout note, no feraheme today.

## 2019-03-10 LAB — HAPTOGLOBIN: Haptoglobin: 173 mg/dL (ref 34–355)

## 2019-04-17 DIAGNOSIS — R310 Gross hematuria: Secondary | ICD-10-CM | POA: Diagnosis not present

## 2019-04-17 DIAGNOSIS — R399 Unspecified symptoms and signs involving the genitourinary system: Secondary | ICD-10-CM | POA: Diagnosis not present

## 2019-05-21 ENCOUNTER — Ambulatory Visit (INDEPENDENT_AMBULATORY_CARE_PROVIDER_SITE_OTHER): Payer: PPO | Admitting: Urology

## 2019-05-21 ENCOUNTER — Other Ambulatory Visit: Payer: Self-pay

## 2019-05-21 ENCOUNTER — Encounter: Payer: Self-pay | Admitting: Urology

## 2019-05-21 VITALS — BP 158/72 | HR 71 | Ht 71.0 in | Wt 172.0 lb

## 2019-05-21 DIAGNOSIS — R310 Gross hematuria: Secondary | ICD-10-CM

## 2019-05-21 NOTE — Progress Notes (Signed)
05/21/19 10:27 AM   Ivan Reyes 1946/07/23 [id number]  Referring provider: Mariana Arn, MD [address] [place],  [place] [address]  CC: Gross hematuria  HPI: I saw Ivan Reyes in urology clinic in consultation from Dr. Sherilyn Cooter for gross hematuria.  He is a 73 year old male with extensive CAD and PVD on Plavix for multiple cardiac stents who noted a one-month history of gross hematuria with clots and pieces of pale tissue.  Urine culture ultimately did grow >100k group B strep, and he was treated with 1 week of Cipro.  His symptoms resolved.  He denies any history of urinary retention or UTIs previously.  At baseline he voids with a weak stream and some frequency, but is minimally bothersome.  He has a 50-pack-year smoking history and continues to smoke 1/2 pack a day.  He denies any family history of kidney or bladder cancer.  There are no aggravating or alleviating factors.  Severity is moderate.  Renal function is normal with creatinine 1.0, EGFR greater than 60.  PSA was normal at 1.78 in January 2020.  PMH: Past Medical History:  Diagnosis Date  . Arthritis   . Coronary artery disease   . GERD (gastroesophageal reflux disease)   . Hyperlipidemia   . Hypertension   . Mitral valve disorder   . Peripheral vascular disease ([place])   . Sleep apnea    use C-PAP    Surgical History: Past Surgical History:  Procedure Laterality Date  . CAROTID ENDARTERECTOMY Bilateral    Dr. Delana Meyer, [facility]  . CARPAL TUNNEL RELEASE Left 12/23/2016   Procedure: CARPAL TUNNEL RELEASE ENDOSCOPIC;  Surgeon: Corky Mull, MD;  Location: [facility] ORS;  Service: Orthopedics;  Laterality: Left;  . CARPAL TUNNEL RELEASE Right 01/13/2017   Procedure: CARPAL TUNNEL RELEASE ENDOSCOPIC;  Surgeon: Corky Mull, MD;  Location: [facility] ORS;  Service: Orthopedics;  Laterality: Right;  . COLONOSCOPY WITH PROPOFOL N/A 09/04/2018   Procedure: COLONOSCOPY WITH PROPOFOL;  Surgeon: Manya Silvas, MD;  Location:  [facility] ENDOSCOPY;  Service: Endoscopy;  Laterality: N/A;  . CORONARY ANGIOPLASTY     3 stents  . coronary atherosclerosis of  autologous graft    . ESOPHAGOGASTRODUODENOSCOPY (EGD) WITH PROPOFOL N/A 12/12/2017   Procedure: ESOPHAGOGASTRODUODENOSCOPY (EGD) WITH PROPOFOL;  Surgeon: Manya Silvas, MD;  Location: [facility] ENDOSCOPY;  Service: Endoscopy;  Laterality: N/A;  . ESOPHAGOGASTRODUODENOSCOPY (EGD) WITH PROPOFOL N/A 09/04/2018   Procedure: ESOPHAGOGASTRODUODENOSCOPY (EGD) WITH PROPOFOL;  Surgeon: Manya Silvas, MD;  Location: [facility] END[facility]COPY;  Service: Endoscopy;  Laterality: N/A;  . EYE SURGERY Bilateral    Cataract Extraction with IOL  . FRACTURE SURGERY Right    Hip Pinning, Dr. Franchot Mimes, Jr  . JOINT REPLACEMENT     left knee  . KNEE ARTHROPLASTY Left 06/30/2016   Procedure: COMPUTER ASSISTED TOTAL KNEE ARTHROPLASTY;  Surgeon: Dereck Leep, MD;  Location: [facility] ORS;  Service: Orthopedics;  Laterality: Left;  Marland Kitchen MASS EXCISION Left 01/11/2018   Procedure: EXCISION CYSTIC MASS ANTERIOR KNEE;  Surgeon: Dereck Leep, MD;  Location: [facility] ORS;  Service: Orthopedics;  Laterality: Left;    Allergies: No Known Allergies  Family History: No family history of prostate or bladder cancer.  Social History:  reports that he has been smoking cigarettes. He has been smoking about 0.50 packs per day. He has never used smokeless tobacco. He reports current alcohol use of about 2.0 standard drinks of alcohol per week. He reports that he does not use drugs.  ROS: Please see flowsheet from today's date for complete review of systems.  Physical Exam: BP (!) 158/72   Pulse 71   Ht 5' 11"  (1.803 m)   Wt 172 lb (78 kg)   BMI 23.99 kg/m    Constitutional:  Alert and oriented, No acute distress. Cardiovascular: No clubbing, cyanosis, or edema. Respiratory: Normal respiratory effort, no increased work of breathing. GI: Abdomen is soft, nontender, nondistended, no abdominal masses Lymph: No  cervical or inguinal lymphadenopathy. Skin: No rashes, bruises or suspicious lesions. Neurologic: Grossly intact, no focal deficits, moving all 4 extremities. Psychiatric: Normal mood and affect.  Laboratory Data: Reviewed, see HPI Urinalysis today 0-5 WBCs, 0-2 RBCs, no bacteria, nitrite negative  Pertinent Imaging: None to review  Assessment & Plan:   In summary, the patient is a 73 year old male with CAD on Plavix who reports 1 month of gross hematuria with clots as well as pale tissue in the urine.  He was ultimately found to have a UTI and the hematuria resolved with antibiotics.  His urinalysis is benign today.  We discussed possible etiologies of his gross hematuria including UTI versus bladder malignancy.  I did recommend proceeding with CT urogram and cystoscopy in the setting of his passage of pieces of tissue to rule out a bladder tumor with his extensive smoking history.  Return for CT urogram and cystoscopy  Billey Co, MD  [facility] [address] [place], [place] [address] [phone]

## 2019-05-21 NOTE — Patient Instructions (Signed)
Hematuria, Adult Hematuria is blood in the urine. Blood may be visible in the urine, or it may be identified with a test. This condition can be caused by infections of the bladder, urethra, kidney, or prostate. Other possible causes include:  Kidney stones.  Cancer of the urinary tract.  Too much calcium in the urine.  Conditions that are passed from parent to child (inherited conditions).  Exercise that requires a lot of energy. Infections can usually be treated with medicine, and a kidney stone usually will pass through your urine. If neither of these is the cause of your hematuria, more tests may be needed to identify the cause of your symptoms. It is very important to tell your health care provider about any blood in your urine, even if it is painless or the blood stops without treatment. Blood in the urine, when it happens and then stops and then happens again, can be a symptom of a very serious condition, including cancer. There is no pain in the initial stages of many urinary cancers. Follow these instructions at home: Medicines  Take over-the-counter and prescription medicines only as told by your health care provider.  If you were prescribed an antibiotic medicine, take it as told by your health care provider. Do not stop taking the antibiotic even if you start to feel better. Eating and drinking  Drink enough fluid to keep your urine clear or pale yellow. It is recommended that you drink 3-4 quarts (2.8-3.8 L) a day. If you have been diagnosed with an infection, it is recommended that you drink cranberry juice in addition to large amounts of water.  Avoid caffeine, tea, and carbonated beverages. These tend to irritate the bladder.  Avoid alcohol because it may irritate the prostate (men). General instructions  If you have been diagnosed with a kidney stone, follow your health care provider's instructions about straining your urine to catch the stone.  Empty your bladder  often. Avoid holding urine for long periods of time.  If you are male: ? After a bowel movement, wipe from front to back and use each piece of toilet paper only once. ? Empty your bladder before and after sex.  Pay attention to any changes in your symptoms. Tell your health care provider about any changes or any new symptoms.  It is your responsibility to get your test results. Ask your health care provider, or the department performing the test, when your results will be ready.  Keep all follow-up visits as told by your health care provider. This is important. Contact a health care provider if:  You develop back pain.  You have a fever.  You have nausea or vomiting.  Your symptoms do not improve after 3 days.  Your symptoms get worse. Get help right away if:  You develop severe vomiting and are unable take medicine without vomiting.  You develop severe pain in your back or abdomen even though you are taking medicine.  You pass a large amount of blood in your urine.  You pass blood clots in your urine.  You feel very weak or like you might faint.  You faint. Summary  Hematuria is blood in the urine. It has many possible causes.  It is very important that you tell your health care provider about any blood in your urine, even if it is painless or the blood stops without treatment.  Take over-the-counter and prescription medicines only as told by your health care provider.  Drink enough fluid to keep   your urine clear or pale yellow. This information is not intended to replace advice given to you by your health care provider. Make sure you discuss any questions you have with your health care provider. Document Released: 08/02/2005 Document Revised: 07/15/2017 Document Reviewed: 09/04/2016 Elsevier Patient Education  2020 [employer]  

## 2019-05-22 LAB — URINALYSIS, COMPLETE
Bilirubin, UA: NEGATIVE
Glucose, UA: NEGATIVE
Ketones, UA: NEGATIVE
Leukocytes,UA: NEGATIVE
Nitrite, UA: NEGATIVE
Protein,UA: NEGATIVE
Specific Gravity, UA: 1.025 (ref 1.005–1.030)
Urobilinogen, Ur: 0.2 mg/dL (ref 0.2–1.0)
pH, UA: 6.5 (ref 5.0–7.5)

## 2019-05-22 LAB — MICROSCOPIC EXAMINATION
Bacteria, UA: NONE SEEN
Epithelial Cells (non renal): NONE SEEN /hpf (ref 0–10)

## 2019-06-08 ENCOUNTER — Other Ambulatory Visit: Payer: Self-pay

## 2019-06-08 ENCOUNTER — Ambulatory Visit
Admission: RE | Admit: 2019-06-08 | Discharge: 2019-06-08 | Disposition: A | Discharge status: Home / Self Care | Payer: PPO | Source: Ambulatory Visit | Attending: Urology | Admitting: Urology

## 2019-06-08 DIAGNOSIS — R310 Gross hematuria: Secondary | ICD-10-CM | POA: Insufficient documentation

## 2019-06-08 DIAGNOSIS — K5730 Diverticulosis of large intestine without perforation or abscess without bleeding: Secondary | ICD-10-CM | POA: Diagnosis not present

## 2019-06-08 LAB — POCT I-STAT CREATININE: Creatinine, Ser: 1 mg/dL (ref 0.61–1.24)

## 2019-06-08 MED ORDER — IOHEXOL 300 MG/ML  SOLN
125.0000 mL | Freq: Once | INTRAMUSCULAR | Status: AC | PRN
  Administered 2019-06-08: 09:00:00 125 mL via INTRAVENOUS

## 2019-06-13 ENCOUNTER — Ambulatory Visit: Payer: PPO | Admitting: Urology

## 2019-06-13 ENCOUNTER — Other Ambulatory Visit: Payer: Self-pay

## 2019-06-13 ENCOUNTER — Encounter: Payer: Self-pay | Admitting: Urology

## 2019-06-13 VITALS — BP 138/76 | HR 78 | Ht 71.0 in | Wt 177.0 lb

## 2019-06-13 DIAGNOSIS — R310 Gross hematuria: Secondary | ICD-10-CM

## 2019-06-13 DIAGNOSIS — N401 Enlarged prostate with lower urinary tract symptoms: Secondary | ICD-10-CM

## 2019-06-13 DIAGNOSIS — N138 Other obstructive and reflux uropathy: Secondary | ICD-10-CM

## 2019-06-13 LAB — URINALYSIS, COMPLETE
Bilirubin, UA: NEGATIVE
Glucose, UA: NEGATIVE
Ketones, UA: NEGATIVE
Nitrite, UA: NEGATIVE
Protein,UA: NEGATIVE
Specific Gravity, UA: 1.02 (ref 1.005–1.030)
Urobilinogen, Ur: 0.2 mg/dL (ref 0.2–1.0)
pH, UA: 5 (ref 5.0–7.5)

## 2019-06-13 LAB — MICROSCOPIC EXAMINATION
Bacteria, UA: NONE SEEN
Epithelial Cells (non renal): NONE SEEN /hpf (ref 0–10)

## 2019-06-13 MED ORDER — TAMSULOSIN HCL 0.4 MG PO CAPS: 0.4000 mg | ORAL_CAPSULE | Freq: Every day | ORAL | 3 refills | Status: DC

## 2019-06-13 NOTE — Progress Notes (Signed)
Cystoscopy Procedure Note:  Indication: Gross hematuria, history of UTI  After informed consent and discussion of the procedure and its risks, Ivan Reyes was positioned and prepped in the standard fashion. Cystoscopy was performed with a flexible cystoscope. The urethra, bladder neck and entire bladder was visualized in a standard fashion. The prostate was moderate in size with a small intravesical median lobe. The ureteral orifices were visualized in their normal location and orientation.  Moderate bladder trabeculations and multiple small diverticula.  No abnormalities on retroflexion.  Imaging: I personally reviewed the CT urogram dated 06/08/2019.  No hydronephrosis, nephrolithiasis, filling defects, or enhancing masses.  Findings: Moderate bladder trabeculations and small diverticula, no tumors or lesions  Assessment and Plan: Trial of Flomax for his urinary symptoms RTC 3 months for symptom check and PVR  Nickolas Madrid, MD 06/13/2019

## 2019-06-13 NOTE — Patient Instructions (Addendum)
Benign Prostatic Hyperplasia  Benign prostatic hyperplasia (BPH) is an enlarged prostate gland that is caused by the normal aging process and not by cancer. The prostate is a walnut-sized gland that is involved in the production of semen. It is located in front of the rectum and below the bladder. The bladder stores urine and the urethra is the tube that carries the urine out of the body. The prostate may get bigger as a man gets older. An enlarged prostate can press on the urethra. This can make it harder to pass urine. The build-up of urine in the bladder can cause infection. Back pressure and infection may progress to bladder damage and kidney (renal) failure. What are the causes? This condition is part of a normal aging process. However, not all men develop problems from this condition. If the prostate enlarges away from the urethra, urine flow will not be blocked. If it enlarges toward the urethra and compresses it, there will be problems passing urine. What increases the risk? This condition is more likely to develop in men over the age of 75 years. What are the signs or symptoms? Symptoms of this condition include:  Getting up often during the night to urinate.  Needing to urinate frequently during the day.  Difficulty starting urine flow.  Decrease in size and strength of your urine stream.  Leaking (dribbling) after urinating.  Inability to pass urine. This needs immediate treatment.  Inability to completely empty your bladder.  Pain when you pass urine. This is more common if there is also an infection.  Urinary tract infection (UTI). How is this diagnosed? This condition is diagnosed based on your medical history, a physical exam, and your symptoms. Tests will also be done, such as:  A post-void bladder scan. This measures any amount of urine that may remain in your bladder after you finish urinating.  A digital rectal exam. In a rectal exam, your health care provider  checks your prostate by putting a lubricated, gloved finger into your rectum to feel the back of your prostate gland. This exam detects the size of your gland and any abnormal lumps or growths.  An exam of your urine (urinalysis).  A prostate specific antigen (PSA) screening. This is a blood test used to screen for prostate cancer.  An ultrasound. This test uses sound waves to electronically produce a picture of your prostate gland. Your health care provider may refer you to a specialist in kidney and prostate diseases (urologist). How is this treated? Once symptoms begin, your health care provider will monitor your condition (active surveillance or watchful waiting). Treatment for this condition will depend on the severity of your condition. Treatment may include:  Observation and yearly exams. This may be the only treatment needed if your condition and symptoms are mild.  Medicines to relieve your symptoms, including: ? Medicines to shrink the prostate. ? Medicines to relax the muscle of the prostate.  Surgery in severe cases. Surgery may include: ? Prostatectomy. In this procedure, the prostate tissue is removed completely through an open incision or with a laparoscope or robotics. ? Transurethral resection of the prostate (TURP). In this procedure, a tool is inserted through the opening at the tip of the penis (urethra). It is used to cut away tissue of the inner core of the prostate. The pieces are removed through the same opening of the penis. This removes the blockage. ? Transurethral incision (TUIP). In this procedure, small cuts are made in the prostate. This lessens  the prostate's pressure on the urethra. ? Transurethral microwave thermotherapy (TUMT). This procedure uses microwaves to create heat. The heat destroys and removes a small amount of prostate tissue. ? Transurethral needle ablation (TUNA). This procedure uses radio frequencies to destroy and remove a small amount of  prostate tissue. ? Interstitial laser coagulation ([place]). This procedure uses a laser to destroy and remove a small amount of prostate tissue. ? Transurethral electrovaporization (TUVP). This procedure uses electrodes to destroy and remove a small amount of prostate tissue. ? Prostatic urethral lift. This procedure inserts an implant to push the lobes of the prostate away from the urethra. Follow these instructions at home:  Take over-the-counter and prescription medicines only as told by your health care provider.  Monitor your symptoms for any changes. Contact your health care provider with any changes.  Avoid drinking large amounts of liquid before going to bed or out in public.  Avoid or reduce how much caffeine or alcohol you drink.  Give yourself time when you urinate.  Keep all follow-up visits as told by your health care provider. This is important. Contact a health care provider if:  You have unexplained back pain.  Your symptoms do not get better with treatment.  You develop side effects from the medicine you are taking.  Your urine becomes very dark or has a bad smell.  Your lower abdomen becomes distended and you have trouble passing your urine. Get help right away if:  You have a fever or chills.  You suddenly cannot urinate.  You feel lightheaded, or very dizzy, or you faint.  There are large amounts of blood or clots in the urine.  Your urinary problems become hard to manage.  You develop moderate to severe low back or flank pain. The flank is the side of your body between the ribs and the hip. These symptoms may represent a serious problem that is an emergency. Do not wait to see if the symptoms will go away. Get medical help right away. Call your local emergency services (911 in the [place] Do not drive yourself to the hospital. Summary  Benign prostatic hyperplasia (BPH) is an enlarged prostate that is caused by the normal aging process and not by  cancer.  An enlarged prostate can press on the urethra. This can make it hard to pass urine.  This condition is part of a normal aging process and is more likely to develop in men over the age of 31 years.  Get help right away if you suddenly cannot urinate. This information is not intended to replace advice given to you by your health care provider. Make sure you discuss any questions you have with your health care provider. Document Released: 08/02/2005 Document Revised: 06/27/2018 Document Reviewed: 09/06/2016 Elsevier Patient Education  [place]. Tamsulosin capsules What is this medicine? TAMSULOSIN (tam SOO loe sin) is an alpha blocker. It is used to treat the signs and symptoms of an enlarged prostate in men. This condition is also called benign prostatic hyperplasia (BPH). This medicine may be used for other purposes; ask your health care provider or pharmacist if you have questions. COMMON BRAND NAME(S): Flomax What should I tell my health care provider before I take this medicine? They need to know if you have any of the following conditions: advanced kidney disease advanced liver disease low blood pressure prostate cancer an unusual or allergic reaction to tamsulosin, sulfa drugs, other medicines, foods, dyes, or preservatives pregnant or trying to get pregnant breast-feeding  How should I use this medicine? Take this medicine by mouth about 30 minutes after the same meal every day. Follow the directions on the prescription label. Swallow the capsules whole with a glass of water. Do not crush, chew, or open capsules. Do not take your medicine more often than directed. Do not stop taking your medicine unless your doctor tells you to. Talk to your pediatrician regarding the use of this medicine in children. Special care may be needed. Overdosage: If you think you have taken too much of this medicine contact a poison control center or emergency room at once. NOTE: This  medicine is only for you. Do not share this medicine with others. What if I miss a dose? If you miss a dose, take it as soon as you can. If it is almost time for your next dose, take only that dose. Do not take double or extra doses. If you stop taking your medicine for several days or more, ask your doctor or health care professional what dose you should start back on. What may interact with this medicine? cimetidine fluoxetine ketoconazole medicines for erectile disfunction like sildenafil, tadalafil, vardenafil medicines for high blood pressure other alpha-blockers like alfuzosin, doxazosin, phentolamine, phenoxybenzamine, prazosin, terazosin warfarin This list may not describe all possible interactions. Give your health care provider a list of all the medicines, herbs, non-prescription drugs, or dietary supplements you use. Also tell them if you smoke, drink alcohol, or use illegal drugs. Some items may interact with your medicine. What should I watch for while using this medicine? Visit your doctor or health care professional for regular check ups. You will need lab work done before you start this medicine and regularly while you are taking it. Check your blood pressure as directed. Ask your health care professional what your blood pressure should be, and when you should contact him or her. This medicine may make you feel dizzy or lightheaded. This is more likely to happen after the first dose, after an increase in dose, or during hot weather or exercise. Drinking alcohol and taking some medicines can make this worse. Do not drive, use machinery, or do anything that needs mental alertness until you know how this medicine affects you. Do not sit or stand up quickly. If you begin to feel dizzy, sit down until you feel better. These effects can decrease once your body adjusts to the medicine. Contact your doctor or health care professional right away if you have an erection that lasts longer than 4  hours or if it becomes painful. This may be a sign of a serious problem and must be treated right away to prevent permanent damage. If you are thinking of having cataract surgery, tell your eye surgeon that you have taken this medicine. What side effects may I notice from receiving this medicine? Side effects that you should report to your doctor or health care professional as soon as possible: allergic reactions like skin rash or itching, hives, swelling of the lips, mouth, tongue, or throat breathing problems change in vision feeling faint or lightheaded irregular heartbeat prolonged or painful erection weakness Side effects that usually do not require medical attention (report to your doctor or health care professional if they continue or are bothersome): back pain change in sex drive or performance constipation, nausea or vomiting cough drowsy runny or stuffy nose trouble sleeping This list may not describe all possible side effects. Call your doctor for medical advice about side effects. You may report side effects  to FDA at 1-800-FDA-1088. Where should I keep my medicine? Keep out of the reach of children. Store at room temperature between 15 and 30 degrees C (59 and 86 degrees F). Throw away any unused medicine after the expiration date. NOTE: This sheet is a summary. It may not cover all possible information. If you have questions about this medicine, talk to your doctor, pharmacist, or health care provider.  2020 Elsevier/Gold Standard (2018-01-05 12:54:06)

## 2019-07-23 ENCOUNTER — Ambulatory Visit: Payer: PPO | Admitting: Oncology

## 2019-07-23 ENCOUNTER — Ambulatory Visit: Payer: PPO

## 2019-07-23 ENCOUNTER — Other Ambulatory Visit: Payer: PPO

## 2019-09-13 ENCOUNTER — Other Ambulatory Visit: Payer: Self-pay

## 2019-09-13 ENCOUNTER — Ambulatory Visit: Payer: PPO | Admitting: Urology

## 2019-09-13 ENCOUNTER — Encounter: Payer: Self-pay | Admitting: Urology

## 2019-09-13 VITALS — BP 126/80 | HR 80 | Ht 71.0 in | Wt 172.0 lb

## 2019-09-13 DIAGNOSIS — N401 Enlarged prostate with lower urinary tract symptoms: Secondary | ICD-10-CM | POA: Diagnosis not present

## 2019-09-13 DIAGNOSIS — N138 Other obstructive and reflux uropathy: Secondary | ICD-10-CM

## 2019-09-13 LAB — BLADDER SCAN AMB NON-IMAGING

## 2019-09-13 MED ORDER — TAMSULOSIN HCL 0.4 MG PO CAPS: 0.4000 mg | ORAL_CAPSULE | Freq: Every day | ORAL | 3 refills | Status: DC

## 2019-09-13 NOTE — Patient Instructions (Signed)

## 2019-09-13 NOTE — Progress Notes (Signed)
   09/13/2019 11:48 AM   GARRED ROQUES 06/27/1946 [id number]  Reason for visit: Follow up BPH, UTI, gross hematuria  HPI: I saw Mr. Pallett back in urology clinic today for follow-up of the above issues.  He is a 74 year old comorbid male with extensive CAD and PVD on Plavix who I originally saw in the fall 2020 with gross hematuria and passing pieces of pale tissue.  He did have a UTI on culture and his symptoms improved.  With his extensive smoking history and passage of tissue he underwent a work-up with a CT urogram and cystoscopy.  CT was benign and showed a 35 g prostate, and cystoscopy showed moderate bladder trabeculations and a small diverticula, but no tumors or lesions.  He was started on Flomax for symptoms of weak stream and frequency.  Over the last few months he reports has been doing very well and denies any urologic complaints today.  He specifically denies any hematuria, UTI symptoms, or urinary complaints.  He continues to take the Flomax nightly which she feels has significantly improved his urination.  PVR in clinic today normal at 49 mL.  We discussed UTI prevention strategies including cranberry tablet prophylaxis and adequate hydration.  We discussed return precautions including recurrent gross hematuria, UTI, or worsening urinary symptoms.  Continue Flomax RTC 1 year for symptom check   A total of 30 minutes were spent face-to-face with the patient, greater than 50% was spent in patient education, counseling, and coordination of care regarding BPH, UTI, gross hematuria.  Billey Co, [facility] [address], [place], [place] [address] [phone]

## 2019-09-21 ENCOUNTER — Other Ambulatory Visit: Payer: Self-pay | Admitting: Emergency Medicine

## 2019-09-21 DIAGNOSIS — D509 Iron deficiency anemia, unspecified: Secondary | ICD-10-CM

## 2019-09-21 NOTE — Progress Notes (Deleted)
[place]  [phone] [phone]) [phone]  ID: Ivan Reyes OB: January 31, 1946  MR#: [id number]  [id number]  Patient Care Team: Baxter Hire, MD as PCP - General (Internal Medicine)  CHIEF COMPLAINT: Iron deficiency anemia.  INTERVAL HISTORY: Patient returns to clinic today for repeat laboratory work and further evaluation.  He feels significantly improved after receiving IV Feraheme several months ago.  He currently feels well and is asymptomatic.  He has no neurologic complaints.  He denies any recent fevers or illnesses.  He has a good appetite and denies weight loss.  He denies any chest pain, shortness of breath, cough, or hemoptysis.  He denies any nausea, vomiting, constipation, or diarrhea.  He has no melena or hematochezia.  He has no urinary complaints.  Patient feels at his baseline offers no specific complaints today.  REVIEW OF SYSTEMS:   Review of Systems  Constitutional: Negative.  Negative for fever, malaise/fatigue and weight loss.  Respiratory: Negative.  Negative for cough and shortness of breath.   Cardiovascular: Negative.  Negative for chest pain and leg swelling.  Gastrointestinal: Negative.  Negative for abdominal pain, blood in stool and melena.  Genitourinary: Negative.  Negative for dysuria and hematuria.  Musculoskeletal: Negative.  Negative for back pain.  Skin: Negative.  Negative for rash.  Neurological: Negative.  Negative for dizziness, focal weakness, weakness and headaches.  Psychiatric/Behavioral: Negative.  The patient is not nervous/anxious.     As per HPI. Otherwise, a complete review of systems is negative.  PAST MEDICAL HISTORY: Past Medical History:  Diagnosis Date  . Arthritis   . Coronary artery disease   . GERD (gastroesophageal reflux disease)   . Hyperlipidemia   . Hypertension   . Mitral valve disorder   . Peripheral vascular disease ([place])   . Sleep apnea    use C-PAP    PAST  SURGICAL HISTORY: Past Surgical History:  Procedure Laterality Date  . CAROTID ENDARTERECTOMY Bilateral    Dr. Delana Meyer, [facility]  . CARPAL TUNNEL RELEASE Left 12/23/2016   Procedure: CARPAL TUNNEL RELEASE ENDOSCOPIC;  Surgeon: Corky Mull, MD;  Location: [facility] ORS;  Service: Orthopedics;  Laterality: Left;  . CARPAL TUNNEL RELEASE Right 01/13/2017   Procedure: CARPAL TUNNEL RELEASE ENDOSCOPIC;  Surgeon: Corky Mull, MD;  Location: [facility] ORS;  Service: Orthopedics;  Laterality: Right;  . COLONOSCOPY WITH PROPOFOL N/A 09/04/2018   Procedure: COLONOSCOPY WITH PROPOFOL;  Surgeon: Manya Silvas, MD;  Location: [facility] ENDOSCOPY;  Service: Endoscopy;  Laterality: N/A;  . CORONARY ANGIOPLASTY     3 stents  . coronary atherosclerosis of  autologous graft    . ESOPHAGOGASTRODUODENOSCOPY (EGD) WITH PROPOFOL N/A 12/12/2017   Procedure: ESOPHAGOGASTRODUODENOSCOPY (EGD) WITH PROPOFOL;  Surgeon: Manya Silvas, MD;  Location: [facility] ENDOSCOPY;  Service: Endoscopy;  Laterality: N/A;  . ESOPHAGOGASTRODUODENOSCOPY (EGD) WITH PROPOFOL N/A 09/04/2018   Procedure: ESOPHAGOGASTRODUODENOSCOPY (EGD) WITH PROPOFOL;  Surgeon: Manya Silvas, MD;  Location: [facility]COPY;  Service: Endoscopy;  Laterality: N/A;  . EYE SURGERY Bilateral    Cataract Extraction with IOL  . FRACTURE SURGERY Right    Hip Pinning, Dr. Franchot Mimes, Jr  . JOINT REPLACEMENT     left knee  . KNEE ARTHROPLASTY Left 06/30/2016   Procedure: COMPUTER ASSISTED TOTAL KNEE ARTHROPLASTY;  Surgeon: Dereck Leep, MD;  Location: [facility] ORS;  Service: Orthopedics;  Laterality: Left;  Marland Kitchen MASS EXCISION Left 01/11/2018   Procedure: EXCISION CYSTIC MASS ANTERIOR KNEE;  Surgeon: Dereck Leep,  MD;  Location: [facility] ORS;  Service: Orthopedics;  Laterality: Left;    FAMILY HISTORY: No family history on file.  ADVANCED DIRECTIVES (Y/N):  N  HEALTH MAINTENANCE: Social History   Tobacco Use  . Smoking status: Current Every Day Smoker    Packs/day:  0.50    Types: Cigarettes  . Smokeless tobacco: Never Used  Substance Use Topics  . Alcohol use: Yes    Alcohol/week: 2.0 standard drinks    Types: 2 Cans of beer per week    Comment: 4/ week  . Drug use: No     Colonoscopy:  PAP:  Bone density:  Lipid panel:  No Known Allergies  Current Outpatient Medications  Medication Sig Dispense Refill  . Cholecalciferol (VITAMIN D) 2000 units CAPS Take 2,000 Units by mouth every evening.    . clopidogrel (PLAVIX) 75 MG tablet Take 75 mg by mouth daily.    . enalapril (VASOTEC) 20 MG tablet Take 20 mg by mouth 2 (two) times daily.    . fenofibrate micronized (LOFIBRA) 134 MG capsule Take 134 mg by mouth daily before breakfast.     . Glucosamine HCl (GLUCOSAMINE PO) Take 1 tablet by mouth 2 (two) times daily.    . Multiple Vitamins-Minerals (PX COMPLETE SENIOR MULTIVITS) TABS Take 1 tablet by mouth.    . Omega-3 Fatty Acids (OMEGA-3 FISH OIL) 300 MG CAPS Take 300 mg by mouth 2 (two) times daily.    . sildenafil (REVATIO) 20 MG tablet Take 60-100 mg by mouth daily as needed (for erectile dysfunction). Pt states 2 morning and night    . simvastatin (ZOCOR) 40 MG tablet Take 40 mg by mouth daily at 6 PM.     . tamsulosin (FLOMAX) 0.4 MG CAPS capsule Take 1 capsule (0.4 mg total) by mouth daily. 90 capsule 3  . vitamin B-12 (CYANOCOBALAMIN) 1000 MCG tablet Take 1,000 mcg by mouth daily.    . vitamin E 400 UNIT capsule Take 400 Units by mouth every evening.      No current facility-administered medications for this visit.    OBJECTIVE: There were no vitals filed for this visit.   There is no height or weight on file to calculate BMI.    ECOG FS:0 - Asymptomatic  General: Well-developed, well-nourished, no acute distress. Eyes: Pink conjunctiva, anicteric sclera. HEENT: Normocephalic, moist mucous membranes. Lungs: Clear to auscultation bilaterally. Heart: Regular rate and rhythm. No rubs, murmurs, or gallops. Abdomen: Soft, nontender,  nondistended. No organomegaly noted, normoactive bowel sounds. Musculoskeletal: No edema, cyanosis, or clubbing. Neuro: Alert, answering all questions appropriately. Cranial nerves grossly intact. Skin: No rashes or petechiae noted. Psych: Normal affect.  LAB RESULTS:  Lab Results  Component Value Date   NA 137 12/30/2017   K 4.0 12/30/2017   CL 105 12/30/2017   CO2 24 12/30/2017   GLUCOSE 89 12/30/2017   BUN 17 12/30/2017   CREATININE 1.00 06/08/2019   CALCIUM 9.4 12/30/2017   PROT 7.5 06/16/2016   ALBUMIN 4.4 06/16/2016   AST 26 06/16/2016   ALT 16 (L) 06/16/2016   ALKPHOS 39 06/16/2016   BILITOT 0.6 06/16/2016   GFRNONAA >60 12/30/2017   GFRAA >60 12/30/2017    Lab Results  Component Value Date   WBC 8.4 03/09/2019   NEUTROABS 6.1 03/09/2019   HGB 14.0 03/09/2019   HCT 41.6 03/09/2019   MCV 91.2 03/09/2019   PLT 219 03/09/2019   Lab Results  Component Value Date   IRON 44 (L) 03/09/2019  TIBC 378 03/09/2019   IRONPCTSAT 12 (L) 03/09/2019   Lab Results  Component Value Date   FERRITIN 15 (L) 03/09/2019     STUDIES: No results found.  ASSESSMENT: Iron deficiency anemia.  PLAN:   1.  Iron deficiency anemia: Significantly improved.  Patient's hemoglobin is now within normal limits.  His iron stores remain decreased, but are also significantly improved.  He is no longer symptomatic. Colonoscopy and upper endoscopy on September 04, 2018 did not reveal any significant pathology.  He does not require additional Feraheme today.  Return to clinic in 4 months with repeat laboratory work, further evaluation, and consideration of additional IV Feraheme.  I spent a total of 20 minutes face-to-face with the patient of which greater than 50% of the visit was spent in counseling and coordination of care as detailed above.   Patient expressed understanding and was in agreement with this plan. He also understands that He can call clinic at any time with any questions,  concerns, or complaints.    Lloyd Huger, MD   09/21/2019 6:08 AM

## 2019-09-24 ENCOUNTER — Inpatient Hospital Stay: Payer: PPO | Admitting: Oncology

## 2019-09-24 ENCOUNTER — Inpatient Hospital Stay: Payer: PPO

## 2019-10-26 ENCOUNTER — Telehealth: Payer: Self-pay

## 2019-10-26 NOTE — Telephone Encounter (Signed)
Patient walked in clinic to request his last missed appts r/s for lab/MD/Feraheme.

## 2019-11-02 NOTE — Progress Notes (Signed)
[place]  [phone] Fax:(336) [phone]  ID: [name] OB: [birth year]  MR#: [id number]  [id number]  Patient Care Team: Baxter Hire, MD as PCP - General (Internal Medicine)  CHIEF COMPLAINT: Iron deficiency anemia.  INTERVAL HISTORY: Patient was last evaluated in clinic in July 2020.  He returns to clinic today for repeat laboratory work, further evaluation, and continuation of IV Feraheme.  He has noticed increased weakness and fatigue over the past several months.  He otherwise feels well.  He has no neurologic complaints.  He denies any recent fevers or illnesses.  He has a good appetite and denies weight loss.  He denies any chest pain, shortness of breath, cough, or hemoptysis.  He denies any nausea, vomiting, constipation, or diarrhea.  He has no melena or hematochezia.  He has no urinary complaints.  Patient offers no further specific complaints today.  REVIEW OF SYSTEMS:   Review of Systems  Constitutional: Positive for malaise/fatigue. Negative for fever and weight loss.  Respiratory: Negative.  Negative for cough and shortness of breath.   Cardiovascular: Negative.  Negative for chest pain and leg swelling.  Gastrointestinal: Negative.  Negative for abdominal pain, blood in stool and melena.  Genitourinary: Negative.  Negative for dysuria and hematuria.  Musculoskeletal: Negative.  Negative for back pain.  Skin: Negative.  Negative for rash.  Neurological: Positive for weakness. Negative for dizziness, focal weakness and headaches.  Psychiatric/Behavioral: Negative.  The patient is not nervous/anxious.     As per HPI. Otherwise, a complete review of systems is negative.  PAST MEDICAL HISTORY: Past Medical History:  Diagnosis Date  . Arthritis   . Coronary artery disease   . GERD (gastroesophageal reflux disease)   . Hyperlipidemia   . Hypertension   . Mitral valve disorder   . Peripheral vascular disease ([place])   .  Sleep apnea    use C-PAP    PAST SURGICAL HISTORY: Past Surgical History:  Procedure Laterality Date  . CAROTID ENDARTERECTOMY Bilateral    Dr. Delana Meyer, [facility]  . CARPAL TUNNEL RELEASE Left 12/23/2016   Procedure: CARPAL TUNNEL RELEASE ENDOSCOPIC;  Surgeon: Corky Mull, MD;  Location: [facility] ORS;  Service: Orthopedics;  Laterality: Left;  . CARPAL TUNNEL RELEASE Right 01/13/2017   Procedure: CARPAL TUNNEL RELEASE ENDOSCOPIC;  Surgeon: Corky Mull, MD;  Location: [facility] ORS;  Service: Orthopedics;  Laterality: Right;  . COLONOSCOPY WITH PROPOFOL N/A 09/04/2018   Procedure: COLONOSCOPY WITH PROPOFOL;  Surgeon: Manya Silvas, MD;  Location: [facility] ENDOSCOPY;  Service: Endoscopy;  Laterality: N/A;  . CORONARY ANGIOPLASTY     3 stents  . coronary atherosclerosis of  autologous graft    . ESOPHAGOGASTRODUODENOSCOPY (EGD) WITH PROPOFOL N/A 12/12/2017   Procedure: ESOPHAGOGASTRODUODENOSCOPY (EGD) WITH PROPOFOL;  Surgeon: Manya Silvas, MD;  Location: [facility] ENDOSCOPY;  Service: Endoscopy;  Laterality: N/A;  . ESOPHAGOGASTRODUODENOSCOPY (EGD) WITH PROPOFOL N/A 09/04/2018   Procedure: ESOPHAGOGASTRODUODENOSCOPY (EGD) WITH PROPOFOL;  Surgeon: Manya Silvas, MD;  Location: [facility] ENDOSCOPY;  Service: Endoscopy;  Laterality: N/A;  . EYE SURGERY Bilateral    Cataract Extraction with IOL  . FRACTURE SURGERY Right    Hip Pinning, Dr. Franchot Mimes, Jr  . JOINT REPLACEMENT     left knee  . KNEE ARTHROPLASTY Left 06/30/2016   Procedure: COMPUTER ASSISTED TOTAL KNEE ARTHROPLASTY;  Surgeon: Dereck Leep, MD;  Location: [facility] ORS;  Service: Orthopedics;  Laterality: Left;  Marland Kitchen MASS EXCISION Left 01/11/2018   Procedure: EXCISION  CYSTIC MASS ANTERIOR KNEE;  Surgeon: Dereck Leep, MD;  Location: [facility] ORS;  Service: Orthopedics;  Laterality: Left;    FAMILY HISTORY: No family history on file.  ADVANCED DIRECTIVES (Y/N):  N  HEALTH MAINTENANCE: Social History   Tobacco Use  . Smoking status:  Current Every Day Smoker    Packs/day: 0.50    Types: Cigarettes  . Smokeless tobacco: Never Used  Substance Use Topics  . Alcohol use: Yes    Alcohol/week: 2.0 standard drinks    Types: 2 Cans of beer per week    Comment: 4/ week  . Drug use: No     Colonoscopy:  PAP:  Bone density:  Lipid panel:  No Known Allergies  Current Outpatient Medications  Medication Sig Dispense Refill  . Cholecalciferol (VITAMIN D) 2000 units CAPS Take 2,000 Units by mouth every evening.    . clopidogrel (PLAVIX) 75 MG tablet Take 75 mg by mouth daily.    . enalapril (VASOTEC) 20 MG tablet Take 20 mg by mouth 2 (two) times daily.    . fenofibrate micronized (LOFIBRA) 134 MG capsule Take 134 mg by mouth daily before breakfast.     . Glucosamine HCl (GLUCOSAMINE PO) Take 1 tablet by mouth 2 (two) times daily.    . Multiple Vitamins-Minerals (PX COMPLETE SENIOR MULTIVITS) TABS Take 1 tablet by mouth.    . Omega-3 Fatty Acids (OMEGA-3 FISH OIL) 300 MG CAPS Take 300 mg by mouth 2 (two) times daily.    . pantoprazole (PROTONIX) 20 MG tablet Take 20 mg by mouth every morning.    . sildenafil (REVATIO) 20 MG tablet Take 60-100 mg by mouth daily as needed (for erectile dysfunction). Pt states 2 morning and night    . simvastatin (ZOCOR) 40 MG tablet Take 40 mg by mouth daily at 6 PM.     . vitamin B-12 (CYANOCOBALAMIN) 1000 MCG tablet Take 1,000 mcg by mouth daily.    . vitamin E 400 UNIT capsule Take 400 Units by mouth every evening.      No current facility-administered medications for this visit.   Facility-Administered Medications Ordered in Other Visits  Medication Dose Route Frequency Provider Last Rate Last Admin  . 0.9 %  sodium chloride infusion   Intravenous Continuous Lloyd Huger, MD 10 mL/hr at 11/05/19 1507 New Bag at 11/05/19 1507    OBJECTIVE: Vitals:   11/05/19 1411  BP: (!) 125/50  Pulse: 67  Temp: (!) 96.6 F (35.9 C)     Body mass index is 25.86 kg/m.    ECOG FS:0 -  Asymptomatic  General: Well-developed, well-nourished, no acute distress. Eyes: Pink conjunctiva, anicteric sclera. HEENT: Normocephalic, moist mucous membranes. Lungs: No audible wheezing or coughing. Heart: Regular rate and rhythm. Abdomen: Soft, nontender, no obvious distention. Musculoskeletal: No edema, cyanosis, or clubbing. Neuro: Alert, answering all questions appropriately. Cranial nerves grossly intact. Skin: No rashes or petechiae noted. Psych: Normal affect.   LAB RESULTS:  Lab Results  Component Value Date   NA 137 12/30/2017   K 4.0 12/30/2017   CL 105 12/30/2017   CO2 24 12/30/2017   GLUCOSE 89 12/30/2017   BUN 17 12/30/2017   CREATININE 1.00 06/08/2019   CALCIUM 9.4 12/30/2017   PROT 7.5 06/16/2016   ALBUMIN 4.4 06/16/2016   AST 26 06/16/2016   ALT 16 (L) 06/16/2016   ALKPHOS 39 06/16/2016   BILITOT 0.6 06/16/2016   GFRNONAA >60 12/30/2017   GFRAA >60 12/30/2017    Lab  Results  Component Value Date   WBC 8.0 11/05/2019   NEUTROABS 5.6 11/05/2019   HGB 10.0 (L) 11/05/2019   HCT 31.4 (L) 11/05/2019   MCV 81.1 11/05/2019   PLT 266 11/05/2019   Lab Results  Component Value Date   IRON 24 (L) 11/05/2019   TIBC 473 (H) 11/05/2019   IRONPCTSAT 5 (L) 11/05/2019   Lab Results  Component Value Date   FERRITIN 7 (L) 11/05/2019     STUDIES: No results found.  ASSESSMENT: Iron deficiency anemia.  PLAN:   1.  Iron deficiency anemia: Patient's hemoglobin and iron stores have significantly declined and he is symptomatic. Colonoscopy and upper endoscopy on September 04, 2018 did not reveal any significant pathology.  Proceed with 510 mg IV Feraheme today.  Return to clinic in 1 week for second infusion.  Patient will then return to clinic in 2 months for repeat laboratory work, further evaluation, and continuation of treatment if necessary.    I spent a total of 30 minutes reviewing chart data, face-to-face evaluation with the patient, counseling and  coordination of care as detailed above.   Patient expressed understanding and was in agreement with this plan. He also understands that He can call clinic at any time with any questions, concerns, or complaints.    Lloyd Huger, MD   11/05/2019 3:30 PM

## 2019-11-02 NOTE — Progress Notes (Signed)
Tried calling patient unable to reach him

## 2019-11-05 ENCOUNTER — Inpatient Hospital Stay: Payer: PPO

## 2019-11-05 ENCOUNTER — Encounter: Payer: Self-pay | Admitting: Oncology

## 2019-11-05 ENCOUNTER — Inpatient Hospital Stay: Payer: PPO | Attending: Oncology | Admitting: Oncology

## 2019-11-05 ENCOUNTER — Other Ambulatory Visit: Payer: Self-pay

## 2019-11-05 VITALS — BP 125/50 | HR 67 | Temp 96.6°F | Wt 185.4 lb

## 2019-11-05 DIAGNOSIS — Z79899 Other long term (current) drug therapy: Secondary | ICD-10-CM | POA: Insufficient documentation

## 2019-11-05 DIAGNOSIS — D509 Iron deficiency anemia, unspecified: Secondary | ICD-10-CM

## 2019-11-05 DIAGNOSIS — G4730 Sleep apnea, unspecified: Secondary | ICD-10-CM | POA: Diagnosis not present

## 2019-11-05 DIAGNOSIS — I10 Essential (primary) hypertension: Secondary | ICD-10-CM | POA: Diagnosis not present

## 2019-11-05 DIAGNOSIS — F17210 Nicotine dependence, cigarettes, uncomplicated: Secondary | ICD-10-CM | POA: Diagnosis not present

## 2019-11-05 DIAGNOSIS — I2510 Atherosclerotic heart disease of native coronary artery without angina pectoris: Secondary | ICD-10-CM | POA: Insufficient documentation

## 2019-11-05 DIAGNOSIS — E785 Hyperlipidemia, unspecified: Secondary | ICD-10-CM | POA: Insufficient documentation

## 2019-11-05 LAB — CBC WITH DIFFERENTIAL/PLATELET
Abs Immature Granulocytes: 0.04 10*3/uL (ref 0.00–0.07)
Basophils Absolute: 0.1 10*3/uL (ref 0.0–0.1)
Basophils Relative: 1 %
Eosinophils Absolute: 0.2 10*3/uL (ref 0.0–0.5)
Eosinophils Relative: 2 %
HCT: 31.4 % — ABNORMAL LOW (ref 39.0–52.0)
Hemoglobin: 10 g/dL — ABNORMAL LOW (ref 13.0–17.0)
Immature Granulocytes: 1 %
Lymphocytes Relative: 21 %
Lymphs Abs: 1.6 10*3/uL (ref 0.7–4.0)
MCH: 25.8 pg — ABNORMAL LOW (ref 26.0–34.0)
MCHC: 31.8 g/dL (ref 30.0–36.0)
MCV: 81.1 fL (ref 80.0–100.0)
Monocytes Absolute: 0.5 10*3/uL (ref 0.1–1.0)
Monocytes Relative: 7 %
Neutro Abs: 5.6 10*3/uL (ref 1.7–7.7)
Neutrophils Relative %: 68 %
Platelets: 266 10*3/uL (ref 150–400)
RBC: 3.87 MIL/uL — ABNORMAL LOW (ref 4.22–5.81)
RDW: 14.8 % (ref 11.5–15.5)
WBC: 8 10*3/uL (ref 4.0–10.5)
nRBC: 0 % (ref 0.0–0.2)

## 2019-11-05 LAB — IRON AND TIBC
Iron: 24 ug/dL — ABNORMAL LOW (ref 45–182)
Saturation Ratios: 5 % — ABNORMAL LOW (ref 17.9–39.5)
TIBC: 473 ug/dL — ABNORMAL HIGH (ref 250–450)
UIBC: 449 ug/dL

## 2019-11-05 LAB — FERRITIN: Ferritin: 7 ng/mL — ABNORMAL LOW (ref 24–336)

## 2019-11-05 MED ORDER — SODIUM CHLORIDE 0.9 % IV SOLN
510.0000 mg | Freq: Once | INTRAVENOUS | Status: AC
  Administered 2019-11-05: 510 mg via INTRAVENOUS
  Filled 2019-11-05: qty 510

## 2019-11-05 MED ORDER — SODIUM CHLORIDE 0.9 % IV SOLN
INTRAVENOUS | Status: DC
  Filled 2019-11-05: qty 250

## 2019-11-05 NOTE — Progress Notes (Signed)
Patient states he has no energy. 

## 2019-11-12 ENCOUNTER — Inpatient Hospital Stay: Payer: PPO

## 2019-11-12 VITALS — BP 145/53 | HR 60 | Temp 98.0°F | Resp 18

## 2019-11-12 DIAGNOSIS — D509 Iron deficiency anemia, unspecified: Secondary | ICD-10-CM

## 2019-11-12 MED ORDER — SODIUM CHLORIDE 0.9 % IV SOLN
Freq: Once | INTRAVENOUS | Status: AC
  Filled 2019-11-12: qty 250

## 2019-11-12 MED ORDER — SODIUM CHLORIDE 0.9 % IV SOLN
510.0000 mg | Freq: Once | INTRAVENOUS | Status: AC
  Administered 2019-11-12: 510 mg via INTRAVENOUS
  Filled 2019-11-12: qty 510

## 2019-11-12 NOTE — Progress Notes (Signed)
Pt declines to stay for full 30 minutes post observation. Pt tolerated infusion well. Pt and VS stable at discharge, no s/s of distress noted.

## 2019-12-14 DIAGNOSIS — I2510 Atherosclerotic heart disease of native coronary artery without angina pectoris: Secondary | ICD-10-CM | POA: Diagnosis not present

## 2019-12-14 DIAGNOSIS — E782 Mixed hyperlipidemia: Secondary | ICD-10-CM | POA: Diagnosis not present

## 2019-12-14 DIAGNOSIS — I10 Essential (primary) hypertension: Secondary | ICD-10-CM | POA: Diagnosis not present

## 2019-12-17 DIAGNOSIS — K224 Dyskinesia of esophagus: Secondary | ICD-10-CM | POA: Diagnosis not present

## 2019-12-17 DIAGNOSIS — K2270 Barrett's esophagus without dysplasia: Secondary | ICD-10-CM | POA: Diagnosis not present

## 2019-12-17 DIAGNOSIS — E611 Iron deficiency: Secondary | ICD-10-CM | POA: Diagnosis not present

## 2019-12-17 DIAGNOSIS — N529 Male erectile dysfunction, unspecified: Secondary | ICD-10-CM | POA: Diagnosis not present

## 2019-12-20 DIAGNOSIS — I2510 Atherosclerotic heart disease of native coronary artery without angina pectoris: Secondary | ICD-10-CM | POA: Diagnosis not present

## 2019-12-20 DIAGNOSIS — G4733 Obstructive sleep apnea (adult) (pediatric): Secondary | ICD-10-CM | POA: Diagnosis not present

## 2019-12-20 DIAGNOSIS — Z0001 Encounter for general adult medical examination with abnormal findings: Secondary | ICD-10-CM | POA: Diagnosis not present

## 2019-12-20 DIAGNOSIS — E782 Mixed hyperlipidemia: Secondary | ICD-10-CM | POA: Diagnosis not present

## 2019-12-20 DIAGNOSIS — I10 Essential (primary) hypertension: Secondary | ICD-10-CM | POA: Diagnosis not present

## 2019-12-20 DIAGNOSIS — I70219 Atherosclerosis of native arteries of extremities with intermittent claudication, unspecified extremity: Secondary | ICD-10-CM | POA: Diagnosis not present

## 2019-12-20 DIAGNOSIS — Z720 Tobacco use: Secondary | ICD-10-CM | POA: Diagnosis not present

## 2020-01-05 NOTE — Progress Notes (Signed)
[place]  Telephone:(336) [phone] Fax:(336) [phone]  ID: [name] OB: [birth year]  MR#: [id number]  [id number]  Patient Care Team: Baxter Hire, MD as PCP - General (Internal Medicine)  CHIEF COMPLAINT: Iron deficiency anemia.  INTERVAL HISTORY: Patient returns to clinic today for repeat laboratory work, further evaluation, and continuation of IV Feraheme.  He felt initially improved after receiving IV iron several months ago, but still has persistent chronic weakness and fatigue.  He otherwise feels well.  He has no neurologic complaints.  He denies any recent fevers or illnesses.  He has a good appetite and denies weight loss.  He denies any chest pain, shortness of breath, cough, or hemoptysis.  He denies any nausea, vomiting, constipation, or diarrhea.  He has no melena or hematochezia.  He has no urinary complaints.  Patient offers no further specific complaints today.  REVIEW OF SYSTEMS:   Review of Systems  Constitutional: Positive for malaise/fatigue. Negative for fever and weight loss.  Respiratory: Negative.  Negative for cough and shortness of breath.   Cardiovascular: Negative.  Negative for chest pain and leg swelling.  Gastrointestinal: Negative.  Negative for abdominal pain, blood in stool and melena.  Genitourinary: Negative.  Negative for dysuria and hematuria.  Musculoskeletal: Negative.  Negative for back pain.  Skin: Negative.  Negative for rash.  Neurological: Positive for weakness. Negative for dizziness, focal weakness and headaches.  Psychiatric/Behavioral: Negative.  The patient is not nervous/anxious.     As per HPI. Otherwise, a complete review of systems is negative.  PAST MEDICAL HISTORY: Past Medical History:  Diagnosis Date  . Arthritis   . Coronary artery disease   . GERD (gastroesophageal reflux disease)   . Hyperlipidemia   . Hypertension   . Mitral valve disorder   . Peripheral vascular disease ([place])    . Sleep apnea    use C-PAP    PAST SURGICAL HISTORY: Past Surgical History:  Procedure Laterality Date  . CAROTID ENDARTERECTOMY Bilateral    Dr. Delana Meyer, [facility]  . CARPAL TUNNEL RELEASE Left 12/23/2016   Procedure: CARPAL TUNNEL RELEASE ENDOSCOPIC;  Surgeon: Corky Mull, MD;  Location: [facility] ORS;  Service: Orthopedics;  Laterality: Left;  . CARPAL TUNNEL RELEASE Right 01/13/2017   Procedure: CARPAL TUNNEL RELEASE ENDOSCOPIC;  Surgeon: Corky Mull, MD;  Location: [facility] ORS;  Service: Orthopedics;  Laterality: Right;  . COLONOSCOPY WITH PROPOFOL N/A 09/04/2018   Procedure: COLONOSCOPY WITH PROPOFOL;  Surgeon: Manya Silvas, MD;  Location: [facility] ENDOSCOPY;  Service: Endoscopy;  Laterality: N/A;  . CORONARY ANGIOPLASTY     3 stents  . coronary atherosclerosis of  autologous graft    . ESOPHAGOGASTRODUODENOSCOPY (EGD) WITH PROPOFOL N/A 12/12/2017   Procedure: ESOPHAGOGASTRODUODENOSCOPY (EGD) WITH PROPOFOL;  Surgeon: Manya Silvas, MD;  Location: [facility] ENDOSCOPY;  Service: Endoscopy;  Laterality: N/A;  . ESOPHAGOGASTRODUODENOSCOPY (EGD) WITH PROPOFOL N/A 09/04/2018   Procedure: ESOPHAGOGASTRODUODENOSCOPY (EGD) WITH PROPOFOL;  Surgeon: Manya Silvas, MD;  Location: [facility] ENDOSCOPY;  Service: Endoscopy;  Laterality: N/A;  . EYE SURGERY Bilateral    Cataract Extraction with IOL  . FRACTURE SURGERY Right    Hip Pinning, Dr. Franchot Mimes, Jr  . JOINT REPLACEMENT     left knee  . KNEE ARTHROPLASTY Left 06/30/2016   Procedure: COMPUTER ASSISTED TOTAL KNEE ARTHROPLASTY;  Surgeon: Dereck Leep, MD;  Location: [facility] ORS;  Service: Orthopedics;  Laterality: Left;  Marland Kitchen MASS EXCISION Left 01/11/2018   Procedure: EXCISION CYSTIC MASS ANTERIOR  KNEE;  Surgeon: Dereck Leep, MD;  Location: [facility] ORS;  Service: Orthopedics;  Laterality: Left;    FAMILY HISTORY: History reviewed. No pertinent family history.  ADVANCED DIRECTIVES (Y/N):  N  HEALTH MAINTENANCE: Social History   Tobacco Use   . Smoking status: Current Every Day Smoker    Packs/day: 0.50    Types: Cigarettes  . Smokeless tobacco: Never Used  Substance Use Topics  . Alcohol use: Yes    Alcohol/week: 2.0 standard drinks    Types: 2 Cans of beer per week    Comment: 4/ week  . Drug use: No     Colonoscopy:  PAP:  Bone density:  Lipid panel:  No Known Allergies  Current Outpatient Medications  Medication Sig Dispense Refill  . Cholecalciferol (VITAMIN D) 2000 units CAPS Take 2,000 Units by mouth every evening.    . clopidogrel (PLAVIX) 75 MG tablet Take 75 mg by mouth daily.    . enalapril (VASOTEC) 20 MG tablet Take 20 mg by mouth 2 (two) times daily.    . fenofibrate micronized (LOFIBRA) 134 MG capsule Take 134 mg by mouth daily before breakfast.     . Glucosamine HCl (GLUCOSAMINE PO) Take 1 tablet by mouth 2 (two) times daily.    . Multiple Vitamins-Minerals (PX COMPLETE SENIOR MULTIVITS) TABS Take 1 tablet by mouth.    . Omega-3 Fatty Acids (OMEGA-3 FISH OIL) 300 MG CAPS Take 300 mg by mouth 2 (two) times daily.    . pantoprazole (PROTONIX) 20 MG tablet Take 20 mg by mouth every morning.    . sildenafil (REVATIO) 20 MG tablet Take 60-100 mg by mouth daily as needed (for erectile dysfunction). Pt states 2 morning and night    . simvastatin (ZOCOR) 40 MG tablet Take 40 mg by mouth daily at 6 PM.     . tamsulosin (FLOMAX) 0.4 MG CAPS capsule Take 0.4 mg by mouth daily.    . vitamin B-12 (CYANOCOBALAMIN) 1000 MCG tablet Take 1,000 mcg by mouth daily.    . vitamin E 400 UNIT capsule Take 400 Units by mouth every evening.      No current facility-administered medications for this visit.   Facility-Administered Medications Ordered in Other Visits  Medication Dose Route Frequency Provider Last Rate Last Admin  . 0.9 %  sodium chloride infusion   Intravenous Once Lloyd Huger, MD      . ferumoxytol [facility] Novamed) 510 mg in sodium chloride 0.9 % 100 mL IVPB  510 mg Intravenous Once Lloyd Huger,  MD        OBJECTIVE: Vitals:   01/11/20 1311  BP: (!) 130/58  Pulse: 64  Resp: 20  Temp: 98.2 F (36.8 C)  SpO2: 97%     Body mass index is 24.78 kg/m.    ECOG FS:0 - Asymptomatic  General: Well-developed, well-nourished, no acute distress. Eyes: Pink conjunctiva, anicteric sclera. HEENT: Normocephalic, moist mucous membranes. Lungs: No audible wheezing or coughing. Heart: Regular rate and rhythm. Abdomen: Soft, nontender, no obvious distention. Musculoskeletal: No edema, cyanosis, or clubbing. Neuro: Alert, answering all questions appropriately. Cranial nerves grossly intact. Skin: No rashes or petechiae noted. Psych: Normal affect.   LAB RESULTS:  Lab Results  Component Value Date   NA 137 12/30/2017   K 4.0 12/30/2017   CL 105 12/30/2017   CO2 24 12/30/2017   GLUCOSE 89 12/30/2017   BUN 17 12/30/2017   CREATININE 1.00 06/08/2019   CALCIUM 9.4 12/30/2017   PROT 7.5 06/16/2016  ALBUMIN 4.4 06/16/2016   AST 26 06/16/2016   ALT 16 (L) 06/16/2016   ALKPHOS 39 06/16/2016   BILITOT 0.6 06/16/2016   GFRNONAA >60 12/30/2017   GFRAA >60 12/30/2017    Lab Results  Component Value Date   WBC 7.9 01/10/2020   NEUTROABS 5.6 01/10/2020   HGB 12.6 (L) 01/10/2020   HCT 37.5 (L) 01/10/2020   MCV 86.6 01/10/2020   PLT 205 01/10/2020   Lab Results  Component Value Date   IRON 33 (L) 01/10/2020   TIBC 398 01/10/2020   IRONPCTSAT 8 (L) 01/10/2020   Lab Results  Component Value Date   FERRITIN 17 (L) 01/10/2020     STUDIES: No results found.  ASSESSMENT: Iron deficiency anemia.  PLAN:   1.  Iron deficiency anemia: Patient's hemoglobin has significantly improved and is now essentially within normal limits at 12.6.  His iron stores remain decreased, therefore will pursue an additional 510 mg IV Feraheme today.  Colonoscopy and upper endoscopy on September 04, 2018 did not reveal any significant pathology.  Return to clinic in 3 months with repeat laboratory work,  further evaluation, and continuation of treatment if needed.  I spent a total of 30 minutes reviewing chart data, face-to-face evaluation with the patient, counseling and coordination of care as detailed above.   Patient expressed understanding and was in agreement with this plan. He also understands that He can call clinic at any time with any questions, concerns, or complaints.    Lloyd Huger, MD   01/11/2020 1:43 PM

## 2020-01-07 ENCOUNTER — Other Ambulatory Visit: Payer: PPO

## 2020-01-10 ENCOUNTER — Inpatient Hospital Stay: Payer: PPO | Attending: Oncology

## 2020-01-10 ENCOUNTER — Other Ambulatory Visit: Payer: Self-pay

## 2020-01-10 DIAGNOSIS — Z79899 Other long term (current) drug therapy: Secondary | ICD-10-CM | POA: Diagnosis not present

## 2020-01-10 DIAGNOSIS — D509 Iron deficiency anemia, unspecified: Secondary | ICD-10-CM | POA: Diagnosis not present

## 2020-01-10 DIAGNOSIS — G4730 Sleep apnea, unspecified: Secondary | ICD-10-CM | POA: Diagnosis not present

## 2020-01-10 DIAGNOSIS — F17210 Nicotine dependence, cigarettes, uncomplicated: Secondary | ICD-10-CM | POA: Diagnosis not present

## 2020-01-10 DIAGNOSIS — I2510 Atherosclerotic heart disease of native coronary artery without angina pectoris: Secondary | ICD-10-CM | POA: Insufficient documentation

## 2020-01-10 DIAGNOSIS — I10 Essential (primary) hypertension: Secondary | ICD-10-CM | POA: Diagnosis not present

## 2020-01-10 DIAGNOSIS — E785 Hyperlipidemia, unspecified: Secondary | ICD-10-CM | POA: Insufficient documentation

## 2020-01-10 LAB — CBC WITH DIFFERENTIAL/PLATELET
Abs Immature Granulocytes: 0.03 10*3/uL (ref 0.00–0.07)
Basophils Absolute: 0.1 10*3/uL (ref 0.0–0.1)
Basophils Relative: 1 %
Eosinophils Absolute: 0.2 10*3/uL (ref 0.0–0.5)
Eosinophils Relative: 3 %
HCT: 37.5 % — ABNORMAL LOW (ref 39.0–52.0)
Hemoglobin: 12.6 g/dL — ABNORMAL LOW (ref 13.0–17.0)
Immature Granulocytes: 0 %
Lymphocytes Relative: 17 %
Lymphs Abs: 1.3 10*3/uL (ref 0.7–4.0)
MCH: 29.1 pg (ref 26.0–34.0)
MCHC: 33.6 g/dL (ref 30.0–36.0)
MCV: 86.6 fL (ref 80.0–100.0)
Monocytes Absolute: 0.7 10*3/uL (ref 0.1–1.0)
Monocytes Relative: 9 %
Neutro Abs: 5.6 10*3/uL (ref 1.7–7.7)
Neutrophils Relative %: 70 %
Platelets: 205 10*3/uL (ref 150–400)
RBC: 4.33 MIL/uL (ref 4.22–5.81)
RDW: 19 % — ABNORMAL HIGH (ref 11.5–15.5)
WBC: 7.9 10*3/uL (ref 4.0–10.5)
nRBC: 0 % (ref 0.0–0.2)

## 2020-01-10 LAB — IRON AND TIBC
Iron: 33 ug/dL — ABNORMAL LOW (ref 45–182)
Saturation Ratios: 8 % — ABNORMAL LOW (ref 17.9–39.5)
TIBC: 398 ug/dL (ref 250–450)
UIBC: 365 ug/dL

## 2020-01-10 LAB — FERRITIN: Ferritin: 17 ng/mL — ABNORMAL LOW (ref 24–336)

## 2020-01-11 ENCOUNTER — Inpatient Hospital Stay (HOSPITAL_BASED_OUTPATIENT_CLINIC_OR_DEPARTMENT_OTHER): Payer: PPO | Admitting: Oncology

## 2020-01-11 ENCOUNTER — Encounter: Payer: Self-pay | Admitting: Oncology

## 2020-01-11 ENCOUNTER — Inpatient Hospital Stay: Payer: PPO

## 2020-01-11 VITALS — BP 122/52 | HR 62 | Resp 18

## 2020-01-11 VITALS — BP 130/58 | HR 64 | Temp 98.2°F | Resp 20 | Wt 177.7 lb

## 2020-01-11 DIAGNOSIS — D509 Iron deficiency anemia, unspecified: Secondary | ICD-10-CM | POA: Diagnosis not present

## 2020-01-11 MED ORDER — SODIUM CHLORIDE 0.9 % IV SOLN
510.0000 mg | Freq: Once | INTRAVENOUS | Status: AC
  Administered 2020-01-11: 510 mg via INTRAVENOUS
  Filled 2020-01-11: qty 510

## 2020-01-11 MED ORDER — SODIUM CHLORIDE 0.9 % IV SOLN
Freq: Once | INTRAVENOUS | Status: AC
  Filled 2020-01-11: qty 250

## 2020-01-11 NOTE — Progress Notes (Signed)
Patient here for oncology follow-up appointment, expresses no complaints or concerns at this time.    

## 2020-03-24 DIAGNOSIS — H02831 Dermatochalasis of right upper eyelid: Secondary | ICD-10-CM | POA: Diagnosis not present

## 2020-03-25 ENCOUNTER — Other Ambulatory Visit (INDEPENDENT_AMBULATORY_CARE_PROVIDER_SITE_OTHER): Payer: Self-pay | Admitting: Vascular Surgery

## 2020-03-25 DIAGNOSIS — Z9889 Other specified postprocedural states: Secondary | ICD-10-CM

## 2020-03-25 DIAGNOSIS — I6523 Occlusion and stenosis of bilateral carotid arteries: Secondary | ICD-10-CM

## 2020-03-27 ENCOUNTER — Ambulatory Visit (INDEPENDENT_AMBULATORY_CARE_PROVIDER_SITE_OTHER): Payer: PPO | Admitting: Vascular Surgery

## 2020-03-27 ENCOUNTER — Other Ambulatory Visit: Payer: Self-pay

## 2020-03-27 ENCOUNTER — Ambulatory Visit (INDEPENDENT_AMBULATORY_CARE_PROVIDER_SITE_OTHER): Payer: PPO

## 2020-03-27 ENCOUNTER — Encounter (INDEPENDENT_AMBULATORY_CARE_PROVIDER_SITE_OTHER): Payer: Self-pay | Admitting: Vascular Surgery

## 2020-03-27 VITALS — BP 158/70 | HR 54 | Ht 70.0 in | Wt 179.0 lb

## 2020-03-27 DIAGNOSIS — I6523 Occlusion and stenosis of bilateral carotid arteries: Secondary | ICD-10-CM

## 2020-03-27 DIAGNOSIS — I70213 Atherosclerosis of native arteries of extremities with intermittent claudication, bilateral legs: Secondary | ICD-10-CM | POA: Diagnosis not present

## 2020-03-27 DIAGNOSIS — E782 Mixed hyperlipidemia: Secondary | ICD-10-CM | POA: Diagnosis not present

## 2020-03-27 DIAGNOSIS — M8949 Other hypertrophic osteoarthropathy, multiple sites: Secondary | ICD-10-CM

## 2020-03-27 DIAGNOSIS — Z9889 Other specified postprocedural states: Secondary | ICD-10-CM | POA: Diagnosis not present

## 2020-03-27 DIAGNOSIS — I25118 Atherosclerotic heart disease of native coronary artery with other forms of angina pectoris: Secondary | ICD-10-CM | POA: Diagnosis not present

## 2020-03-27 DIAGNOSIS — M159 Polyosteoarthritis, unspecified: Secondary | ICD-10-CM

## 2020-03-27 DIAGNOSIS — I10 Essential (primary) hypertension: Secondary | ICD-10-CM

## 2020-03-27 NOTE — Progress Notes (Signed)
MRN : [id number]  Ivan Reyes is a 74 y.o. (1945/10/18) male who presents with chief complaint of  Chief Complaint  Patient presents with  . Follow-up    Ref .Johnston Cartoid   .  History of Present Illness:   The patient returns to the office for followup regarding his leg symptoms. He feels there has been a significant deterioration in the lower extremity symptoms. The patient notes interval shortening of their claudication distance and development of mild rest pain symptoms. No new ulcers or wounds have occurred since the last visit.  There have been no significant changes to the patient's overall health care.  He is also followed for his carotid stenosis.  The patient denies amaurosis fugax or recent TIA symptoms. There are no recent neurological changes noted.  The patient denies history of DVT, PE or superficial thrombophlebitis. The patient denies recent episodes of angina or shortness of breath.   Carotid duplex done today shows RICA 40-59% s/p endarterectomy and LICA <28% s/p endarterectomy   Current Meds  Medication Sig  . Cholecalciferol (VITAMIN D) 2000 units CAPS Take 2,000 Units by mouth every evening.  . clopidogrel (PLAVIX) 75 MG tablet Take 75 mg by mouth daily.  . enalapril (VASOTEC) 20 MG tablet Take 20 mg by mouth 2 (two) times daily.  . fenofibrate micronized (LOFIBRA) 134 MG capsule Take 134 mg by mouth daily before breakfast.   . Glucosamine HCl (GLUCOSAMINE PO) Take 1 tablet by mouth 2 (two) times daily.  . Multiple Vitamins-Minerals (PX COMPLETE SENIOR MULTIVITS) TABS Take 1 tablet by mouth.  . Omega-3 Fatty Acids (OMEGA-3 FISH OIL) 300 MG CAPS Take 300 mg by mouth 2 (two) times daily.  . pantoprazole (PROTONIX) 20 MG tablet Take 20 mg by mouth every morning.  . sildenafil (REVATIO) 20 MG tablet Take 60-100 mg by mouth daily as needed (for erectile dysfunction). Pt states 2 morning and night  . simvastatin (ZOCOR) 40 MG tablet Take 40 mg by  mouth daily at 6 PM.   . vitamin B-12 (CYANOCOBALAMIN) 1000 MCG tablet Take 1,000 mcg by mouth daily.  . vitamin E 400 UNIT capsule Take 400 Units by mouth every evening.   . [DISCONTINUED] clopidogrel (PLAVIX) 75 MG tablet Take 1 tablet by mouth daily.    Past Medical History:  Diagnosis Date  . Arthritis   . Coronary artery disease   . GERD (gastroesophageal reflux disease)   . Hyperlipidemia   . Hypertension   . Mitral valve disorder   . Peripheral vascular disease ([place])   . Sleep apnea    use C-PAP    Past Surgical History:  Procedure Laterality Date  . CAROTID ENDARTERECTOMY Bilateral    Dr. Delana Meyer, [facility]  . CARPAL TUNNEL RELEASE Left 12/23/2016   Procedure: CARPAL TUNNEL RELEASE ENDOSCOPIC;  Surgeon: Corky Mull, MD;  Location: [facility] ORS;  Service: Orthopedics;  Laterality: Left;  . CARPAL TUNNEL RELEASE Right 01/13/2017   Procedure: CARPAL TUNNEL RELEASE ENDOSCOPIC;  Surgeon: Corky Mull, MD;  Location: [facility] ORS;  Service: Orthopedics;  Laterality: Right;  . COLONOSCOPY WITH PROPOFOL N/A 09/04/2018   Procedure: COLONOSCOPY WITH PROPOFOL;  Surgeon: Manya Silvas, MD;  Location: [facility] ENDOSCOPY;  Service: Endoscopy;  Laterality: N/A;  . CORONARY ANGIOPLASTY     3 stents  . coronary atherosclerosis of  autologous graft    . ESOPHAGOGASTRODUODENOSCOPY (EGD) WITH PROPOFOL N/A 12/12/2017   Procedure: ESOPHAGOGASTRODUODENOSCOPY (EGD) WITH PROPOFOL;  Surgeon: Manya Silvas, MD;  Location: [facility] ENDOSCOPY;  Service: Endoscopy;  Laterality: N/A;  . ESOPHAGOGASTRODUODENOSCOPY (EGD) WITH PROPOFOL N/A 09/04/2018   Procedure: ESOPHAGOGASTRODUODENOSCOPY (EGD) WITH PROPOFOL;  Surgeon: Manya Silvas, MD;  Location: [facility] ENDOSCOPY;  Service: Endoscopy;  Laterality: N/A;  . EYE SURGERY Bilateral    Cataract Extraction with IOL  . FRACTURE SURGERY Right    Hip Pinning, Dr. Franchot Mimes, Jr  . JOINT REPLACEMENT     left knee  . KNEE ARTHROPLASTY Left 06/30/2016   Procedure:  COMPUTER ASSISTED TOTAL KNEE ARTHROPLASTY;  Surgeon: Dereck Leep, MD;  Location: [facility] ORS;  Service: Orthopedics;  Laterality: Left;  Marland Kitchen MASS EXCISION Left 01/11/2018   Procedure: EXCISION CYSTIC MASS ANTERIOR KNEE;  Surgeon: Dereck Leep, MD;  Location: [facility] ORS;  Service: Orthopedics;  Laterality: Left;    Social History Social History   Tobacco Use  . Smoking status: Current Every Day Smoker    Packs/day: 0.50    Types: Cigarettes  . Smokeless tobacco: Never Used  Vaping Use  . Vaping Use: Never used  Substance Use Topics  . Alcohol use: Yes    Alcohol/week: 2.0 standard drinks    Types: 2 Cans of beer per week    Comment: 4/ week  . Drug use: No    Family History No family history on file.  No Known Allergies   REVIEW OF SYSTEMS (Negative unless checked)  Constitutional: [] Weight loss  [] Fever  [] Chills Cardiac: [] Chest pain   [] Chest pressure   [] Palpitations   [] Shortness of breath when laying flat   [] Shortness of breath with exertion. Vascular:  [x] Pain in legs with walking   [] Pain in legs at rest  [] History of DVT   [] Phlebitis   [] Swelling in legs   [] Varicose veins   [] Non-healing ulcers Pulmonary:   [] Uses home oxygen   [] Productive cough   [] Hemoptysis   [] Wheeze  [] COPD   [] Asthma Neurologic:  [] Dizziness   [] Seizures   [] History of stroke   [] History of TIA  [] Aphasia   [] Vissual changes   [] Weakness or numbness in arm   [] Weakness or numbness in leg Musculoskeletal:   [] Joint swelling   [x] Joint pain   [x] Low back pain Hematologic:  [] Easy bruising  [] Easy bleeding   [] Hypercoagulable state   [] Anemic Gastrointestinal:  [] Diarrhea   [] Vomiting  [] Gastroesophageal reflux/heartburn   [] Difficulty swallowing. Genitourinary:  [] Chronic kidney disease   [] Difficult urination  [] Frequent urination   [] Blood in urine Skin:  [] Rashes   [] Ulcers  Psychological:  [] History of anxiety   []  History of major depression.  Physical Examination  Vitals:   03/27/20  1128  BP: (!) 158/70  Pulse: (!) 54  Weight: 179 lb (81.2 kg)  Height: 5\' 10"  (1.778 m)   Body mass index is 25.68 kg/m. Gen: WD/WN, NAD Head: [place]/AT, No temporalis wasting.  Ear/Nose/Throat: Hearing grossly intact, nares w/o erythema or drainage Eyes: PER, EOMI, sclera nonicteric.  Neck: Supple, no large masses.   Pulmonary:  Good air movement, no audible wheezing bilaterally, no use of accessory muscles.  Cardiac: RRR, no JVD Vascular:  Vessel Right Left  Radial Palpable Palpable  PT Not Palpable Not Palpable  DP Not Palpable Not Palpable  Gastrointestinal: Non-distended. No guarding/no peritoneal signs.  Musculoskeletal: M/S 5/5 throughout.  No deformity or atrophy.  Neurologic: CN 2-12 intact. Symmetrical.  Speech is fluent. Motor exam as listed above. Psychiatric: Judgment intact, Mood & affect appropriate for pt's clinical situation. Dermatologic: No rashes or ulcers noted.  No changes consistent with cellulitis.   CBC Lab Results  Component Value Date   WBC 7.9 01/10/2020   HGB 12.6 (L) 01/10/2020   HCT 37.5 (L) 01/10/2020   MCV 86.6 01/10/2020   PLT 205 01/10/2020    BMET    Component Value Date/Time   NA 137 12/30/2017 1150   K 4.0 12/30/2017 1150   K 4.1 06/28/2014 1603   CL 105 12/30/2017 1150   CO2 24 12/30/2017 1150   GLUCOSE 89 12/30/2017 1150   BUN 17 12/30/2017 1150   CREATININE 1.00 06/08/2019 0858   CALCIUM 9.4 12/30/2017 1150   GFRNONAA >60 12/30/2017 1150   GFRAA >60 12/30/2017 1150   CrCl cannot be calculated (Patient's most recent lab result is older than the maximum 21 days allowed.).  COAG Lab Results  Component Value Date   INR 0.92 06/16/2016    Radiology No results found.   Assessment/Plan 1. Bilateral carotid artery stenosis Recommend:  Given the patient's asymptomatic subcritical stenosis no further invasive testing or surgery at this time.  Carotid duplex done today shows RICA 40-59% s/p endarterectomy and LICA <09%  s/p endarterectomy  Continue antiplatelet therapy as prescribed Continue management of CAD, HTN and Hyperlipidemia Healthy heart diet,  encouraged exercise at least 4 times per week Follow up in 12 months with duplex ultrasound and physical exam   2. Atherosclerosis of native artery of both lower extremities with intermittent claudication (HCC) Recommend:  Patient should undergo arterial duplex of the lower extremity ASAP because there has been a significant deterioration in the patient's lower extremity symptoms.  The patient states they are having increased pain and a marked decrease in the distance that they can walk.  The risks and benefits as well as the alternatives were discussed in detail with the patient.  All questions were answered.  Patient agrees to proceed and understands this could be a prelude to angiography and intervention.  The patient will follow up with me in the office to review the studies.   - VAS [place] LOWER EXTREMITY ARTERIAL DUPLEX; Future  3. Benign essential hypertension Continue antihypertensive medications as already ordered, these medications have been reviewed and there are no changes at this time.   4. Coronary artery disease of native artery of native heart with stable angina pectoris (HCC) Continue cardiac and antihypertensive medications as already ordered and reviewed, no changes at this time.  Continue statin as ordered and reviewed, no changes at this time  Nitrates PRN for chest pain   5. Primary osteoarthritis involving multiple joints Continue NSAID medications as already ordered, these medications have been reviewed and there are no changes at this time.  Continued activity and therapy was stressed.   6. Mixed hyperlipidemia Continue statin as ordered and reviewed, no changes at this time     Hortencia Pilar, MD  03/27/2020 12:14 PM

## 2020-04-10 ENCOUNTER — Encounter (INDEPENDENT_AMBULATORY_CARE_PROVIDER_SITE_OTHER): Payer: Self-pay | Admitting: Vascular Surgery

## 2020-04-10 ENCOUNTER — Ambulatory Visit (INDEPENDENT_AMBULATORY_CARE_PROVIDER_SITE_OTHER): Payer: PPO | Admitting: Vascular Surgery

## 2020-04-10 ENCOUNTER — Ambulatory Visit (INDEPENDENT_AMBULATORY_CARE_PROVIDER_SITE_OTHER): Payer: PPO

## 2020-04-10 ENCOUNTER — Telehealth (INDEPENDENT_AMBULATORY_CARE_PROVIDER_SITE_OTHER): Payer: Self-pay

## 2020-04-10 ENCOUNTER — Other Ambulatory Visit: Payer: Self-pay

## 2020-04-10 VITALS — BP 131/71 | HR 56 | Resp 16 | Wt 178.0 lb

## 2020-04-10 DIAGNOSIS — E782 Mixed hyperlipidemia: Secondary | ICD-10-CM | POA: Diagnosis not present

## 2020-04-10 DIAGNOSIS — I10 Essential (primary) hypertension: Secondary | ICD-10-CM

## 2020-04-10 DIAGNOSIS — I25118 Atherosclerotic heart disease of native coronary artery with other forms of angina pectoris: Secondary | ICD-10-CM

## 2020-04-10 DIAGNOSIS — I70213 Atherosclerosis of native arteries of extremities with intermittent claudication, bilateral legs: Secondary | ICD-10-CM | POA: Diagnosis not present

## 2020-04-10 DIAGNOSIS — I6523 Occlusion and stenosis of bilateral carotid arteries: Secondary | ICD-10-CM | POA: Diagnosis not present

## 2020-04-10 NOTE — Telephone Encounter (Signed)
Spoke with the patient and he is scheduled with Dr. Delana Meyer for a left leg angio on 04/22/20 with a 11:00 am arrival time to the [facility] Covid testing on 04/18/20 between 8-1 pm at the [facility]. Pre-procedure instructions were discussed and will be mailed.

## 2020-04-12 NOTE — Progress Notes (Deleted)
Ivan Reyes  [phone] [phone]  ID: Ivan Reyes OB: 1946/04/03  MR#: [id number]  RSW#:[id number]  Patient Care Team: Baxter Hire, MD as PCP - General (Internal Medicine)  CHIEF COMPLAINT: Iron deficiency anemia.  INTERVAL HISTORY: Patient returns to clinic today for repeat laboratory work, further evaluation, and continuation of IV Feraheme.  He felt initially improved after receiving IV iron several months ago, but still has persistent chronic weakness and fatigue.  He otherwise feels well.  He has no neurologic complaints.  He denies any recent fevers or illnesses.  He has a good appetite and denies weight loss.  He denies any chest pain, shortness of breath, cough, or hemoptysis.  He denies any nausea, vomiting, constipation, or diarrhea.  He has no melena or hematochezia.  He has no urinary complaints.  Patient offers no further specific complaints today.  REVIEW OF SYSTEMS:   Review of Systems  Constitutional: Positive for malaise/fatigue. Negative for fever and weight loss.  Respiratory: Negative.  Negative for cough and shortness of breath.   Cardiovascular: Negative.  Negative for chest pain and leg swelling.  Gastrointestinal: Negative.  Negative for abdominal pain, blood in stool and melena.  Genitourinary: Negative.  Negative for dysuria and hematuria.  Musculoskeletal: Negative.  Negative for back pain.  Skin: Negative.  Negative for rash.  Neurological: Positive for weakness. Negative for dizziness, focal weakness and headaches.  Psychiatric/Behavioral: Negative.  The patient is not nervous/anxious.     As per HPI. Otherwise, a complete review of systems is negative.  PAST MEDICAL HISTORY: Past Medical History:  Diagnosis Date  . Arthritis   . Coronary artery disease   . GERD (gastroesophageal reflux disease)   . Hyperlipidemia   . Hypertension   . Mitral valve disorder   . Peripheral vascular disease ([place])     . Sleep apnea    use C-PAP    PAST SURGICAL HISTORY: Past Surgical History:  Procedure Laterality Date  . CAROTID ENDARTERECTOMY Bilateral    Dr. Delana Meyer, [facility]  . CARPAL TUNNEL RELEASE Left 12/23/2016   Procedure: CARPAL TUNNEL RELEASE ENDOSCOPIC;  Surgeon: Corky Mull, MD;  Location: [facility] ORS;  Service: Orthopedics;  Laterality: Left;  . CARPAL TUNNEL RELEASE Right 01/13/2017   Procedure: CARPAL TUNNEL RELEASE ENDOSCOPIC;  Surgeon: Corky Mull, MD;  Location: [facility] ORS;  Service: Orthopedics;  Laterality: Right;  . COLONOSCOPY WITH PROPOFOL N/A 09/04/2018   Procedure: COLONOSCOPY WITH PROPOFOL;  Surgeon: Manya Silvas, MD;  Location: [facility] ENDOSCOPY;  Service: Endoscopy;  Laterality: N/A;  . CORONARY ANGIOPLASTY     3 stents  . coronary atherosclerosis of  autologous graft    . ESOPHAGOGASTRODUODENOSCOPY (EGD) WITH PROPOFOL N/A 12/12/2017   Procedure: ESOPHAGOGASTRODUODENOSCOPY (EGD) WITH PROPOFOL;  Surgeon: Manya Silvas, MD;  Location: [facility] END[facility]COPY;  Service: Endoscopy;  Laterality: N/A;  . ESOPHAGOGASTRODUODENOSCOPY (EGD) WITH PROPOFOL N/A 09/04/2018   Procedure: ESOPHAGOGASTRODUODENOSCOPY (EGD) WITH PROPOFOL;  Surgeon: Manya Silvas, MD;  Location: [facility] ENDOSCOPY;  Service: Endoscopy;  Laterality: N/A;  . EYE SURGERY Bilateral    Cataract Extraction with IOL  . FRACTURE SURGERY Right    Hip Pinning, Dr. Franchot Mimes, Jr  . JOINT REPLACEMENT     left knee  . KNEE ARTHROPLASTY Left 06/30/2016   Procedure: COMPUTER ASSISTED TOTAL KNEE ARTHROPLASTY;  Surgeon: Dereck Leep, MD;  Location: [facility] ORS;  Service: Orthopedics;  Laterality: Left;  Marland Kitchen MASS EXCISION Left 01/11/2018   Procedure: EXCISION CYSTIC MASS  ANTERIOR KNEE;  Surgeon: Dereck Leep, MD;  Location: [facility] ORS;  Service: Orthopedics;  Laterality: Left;    FAMILY HISTORY: No family history on file.  ADVANCED DIRECTIVES (Y/N):  N  HEALTH MAINTENANCE: Social History   Tobacco Use  . Smoking status:  Current Every Day Smoker    Packs/day: 0.50    Types: Cigarettes  . Smokeless tobacco: Never Used  Vaping Use  . Vaping Use: Never used  Substance Use Topics  . Alcohol use: Yes    Alcohol/week: 2.0 standard drinks    Types: 2 Cans of beer per week    Comment: 4/ week  . Drug use: No     Colonoscopy:  PAP:  Bone density:  Lipid panel:  No Known Allergies  Current Outpatient Medications  Medication Sig Dispense Refill  . Cholecalciferol (VITAMIN D) 2000 units CAPS Take 2,000 Units by mouth every evening.    . clopidogrel (PLAVIX) 75 MG tablet Take 75 mg by mouth daily.    . enalapril (VASOTEC) 20 MG tablet Take 20 mg by mouth 2 (two) times daily.    . fenofibrate micronized (LOFIBRA) 134 MG capsule Take 134 mg by mouth daily before breakfast.     . Glucosamine HCl (GLUCOSAMINE PO) Take 1 tablet by mouth 2 (two) times daily.    . Multiple Vitamins-Minerals (PX COMPLETE SENIOR MULTIVITS) TABS Take 1 tablet by mouth.    . Omega-3 Fatty Acids (OMEGA-3 FISH OIL) 300 MG CAPS Take 300 mg by mouth 2 (two) times daily.    . pantoprazole (PROTONIX) 20 MG tablet Take 20 mg by mouth every morning.    . sildenafil (REVATIO) 20 MG tablet Take 60-100 mg by mouth daily as needed (for erectile dysfunction). Pt states 2 morning and night    . simvastatin (ZOCOR) 40 MG tablet Take 40 mg by mouth daily at 6 PM.     . tamsulosin (FLOMAX) 0.4 MG CAPS capsule Take 0.4 mg by mouth daily. (Patient not taking: Reported on 03/27/2020)    . vitamin B-12 (CYANOCOBALAMIN) 1000 MCG tablet Take 1,000 mcg by mouth daily.    . vitamin E 400 UNIT capsule Take 400 Units by mouth every evening.      No current facility-administered medications for this visit.    OBJECTIVE: There were no vitals filed for this visit.   There is no height or weight on file to calculate BMI.    ECOG FS:0 - Asymptomatic  General: Well-developed, well-nourished, no acute distress. Eyes: Pink conjunctiva, anicteric sclera. HEENT:  Normocephalic, moist mucous membranes. Lungs: No audible wheezing or coughing. Heart: Regular rate and rhythm. Abdomen: Soft, nontender, no obvious distention. Musculoskeletal: No edema, cyanosis, or clubbing. Neuro: Alert, answering all questions appropriately. Cranial nerves grossly intact. Skin: No rashes or petechiae noted. Psych: Normal affect.   LAB RESULTS:  Lab Results  Component Value Date   NA 137 12/30/2017   K 4.0 12/30/2017   CL 105 12/30/2017   CO2 24 12/30/2017   GLUCOSE 89 12/30/2017   BUN 17 12/30/2017   CREATININE 1.00 06/08/2019   CALCIUM 9.4 12/30/2017   PROT 7.5 06/16/2016   ALBUMIN 4.4 06/16/2016   AST 26 06/16/2016   ALT 16 (L) 06/16/2016   ALKPHOS 39 06/16/2016   BILITOT 0.6 06/16/2016   GFRNONAA >60 12/30/2017   GFRAA >60 12/30/2017    Lab Results  Component Value Date   WBC 7.9 01/10/2020   NEUTROABS 5.6 01/10/2020   HGB 12.6 (L) 01/10/2020  HCT 37.5 (L) 01/10/2020   MCV 86.6 01/10/2020   PLT 205 01/10/2020   Lab Results  Component Value Date   IRON 33 (L) 01/10/2020   TIBC 398 01/10/2020   IRONPCTSAT 8 (L) 01/10/2020   Lab Results  Component Value Date   FERRITIN 17 (L) 01/10/2020     STUDIES: VAS US CAROTID  Result Date: 04/10/2020 Carotid Arterial Duplex Study Comparison Study:  12/14/2016 Performing Technologist: Charlane Ferretti RT (R)(VS)  Examination Guidelines: A complete evaluation includes B-mode imaging, spectral Doppler, color Doppler, and power Doppler as needed of all accessible portions of each vessel. Bilateral testing is considered an integral part of a complete examination. Limited examinations for reoccurring indications may be performed as noted.  Right Carotid Findings: +----------+--------+--------+--------+------------------+--------------------+           PSV cm/sEDV cm/sStenosisPlaque DescriptionComments             +----------+--------+--------+--------+------------------+--------------------+ CCA Prox   123     13                                                     +----------+--------+--------+--------+------------------+--------------------+ CCA Mid   133     14                                intimal thickening   +----------+--------+--------+--------+------------------+--------------------+ CCA Distal111     16                                intimal thickening   +----------+--------+--------+--------+------------------+--------------------+ ICA Prox  71      14                                ICA/CCA ratio = .1.0 +----------+--------+--------+--------+------------------+--------------------+ ICA Mid   121     29                                                     +----------+--------+--------+--------+------------------+--------------------+ ICA Distal112     25                                                     +----------+--------+--------+--------+------------------+--------------------+ ECA       166     18                                                     +----------+--------+--------+--------+------------------+--------------------+ +----------+--------+-------+--------+-------------------+           PSV cm/sEDV cmsDescribeArm Pressure (mmHG) +----------+--------+-------+--------+-------------------+ [id number]                                        +----------+--------+-------+--------+-------------------+ +---------+--------+--+--------+-+  VertebralPSV cm/s50EDV cm/s8 +---------+--------+--+--------+-+ Left Carotid Findings: +----------+--------+--------+--------+------------------+-------------------+           PSV cm/sEDV cm/sStenosisPlaque DescriptionComments            +----------+--------+--------+--------+------------------+-------------------+ CCA Prox  142     19              calcific                              +----------+--------+--------+--------+------------------+-------------------+ CCA Mid   109      20              calcific          intimal thickening  +----------+--------+--------+--------+------------------+-------------------+ CCA Distal108     20                                intimal thickening  +----------+--------+--------+--------+------------------+-------------------+ ICA Prox  56      15                                ICA/CCA ratio = 1.0 +----------+--------+--------+--------+------------------+-------------------+ ICA Mid   88      29                                                    +----------+--------+--------+--------+------------------+-------------------+ ICA Distal104     30                                                    +----------+--------+--------+--------+------------------+-------------------+ ECA       117     12                                                    +----------+--------+--------+--------+------------------+-------------------+ +----------+--------+--------+--------+-------------------+           PSV cm/sEDV cm/sDescribeArm Pressure (mmHG) +----------+--------+--------+--------+-------------------+ [id number]                                         +----------+--------+--------+--------+-------------------+ +---------+--------+---+--------+--+ VertebralPSV cm/s144EDV cm/s43 +---------+--------+---+--------+--+ Summary: Right Carotid: Velocities in the right ICA are consistent with a 40-59%                stenosis. Patent right carotid endarterectomy with no evidence of                restenosis. Left Carotid: Velocities in the left ICA are consistent with a 1-39% stenosis.               The ECA appears >50% stenosed. Patent left caotid endarterectomy               with no evidence of restenosis. Vertebrals:  Bilateral vertebral arteries demonstrate antegrade flow. Subclavians: Normal flow hemodynamics were seen in bilateral subclavian  arteries. *See table(s) above for measurements and  observations.  Electronically signed by Hortencia Pilar MD on 04/10/2020 at 3:54:16 PM.    Final    VAS [place] LOWER EXTREMITY ARTERIAL DUPLEX  Result Date: 04/10/2020 LOWER EXTREMITY ARTERIAL DUPLEX STUDY  Current ABI: N/A Performing Technologist: Charlane Ferretti RT (R)(VS)  Examination Guidelines: A complete evaluation includes B-mode imaging, spectral Doppler, color Doppler, and power Doppler as needed of all accessible portions of each vessel. Bilateral testing is considered an integral part of a complete examination. Limited examinations for reoccurring indications may be performed as noted.  +-----------+--------+-----+--------+----------+--------+ RIGHT      PSV cm/sRatioStenosisWaveform  Comments +-----------+--------+-----+--------+----------+--------+ CFA Distal 165                  biphasic           +-----------+--------+-----+--------+----------+--------+ DFA        165                  triphasic          +-----------+--------+-----+--------+----------+--------+ SFA Prox   91                   monophasic         +-----------+--------+-----+--------+----------+--------+ SFA Mid    70                   monophasic         +-----------+--------+-----+--------+----------+--------+ SFA Distal 170                  monophasic         +-----------+--------+-----+--------+----------+--------+ POP Prox   93                   monophasic         +-----------+--------+-----+--------+----------+--------+ POP Distal 111                  monophasic         +-----------+--------+-----+--------+----------+--------+ ATA Distal 66                   monophasic         +-----------+--------+-----+--------+----------+--------+ PTA Distal 86                   monophasic         +-----------+--------+-----+--------+----------+--------+ PERO Distal17                   monophasic         +-----------+--------+-----+--------+----------+--------+   +-----------+--------+-----+--------+----------+-------------------+ LEFT       PSV cm/sRatioStenosisWaveform  Comments            +-----------+--------+-----+--------+----------+-------------------+ CFA Distal 226                  biphasic                      +-----------+--------+-----+--------+----------+-------------------+ DFA        135                  biphasic                      +-----------+--------+-----+--------+----------+-------------------+ SFA Prox   254                  biphasic  Narrowing 68 to 254 +-----------+--------+-----+--------+----------+-------------------+ SFA Mid    60  monophasicCollateral          +-----------+--------+-----+--------+----------+-------------------+ SFA Distal 80                   monophasic                    +-----------+--------+-----+--------+----------+-------------------+ POP Prox   180                  monophasic                    +-----------+--------+-----+--------+----------+-------------------+ POP Distal 74                   monophasic                    +-----------+--------+-----+--------+----------+-------------------+ ATA Distal 8                    monophasic                    +-----------+--------+-----+--------+----------+-------------------+ PTA Distal 44                   monophasic                    +-----------+--------+-----+--------+----------+-------------------+ PERO Distal32                   monophasic                    +-----------+--------+-----+--------+----------+-------------------+  Summary: Right: 30-49% stenosis noted in the superficial femoral artery. Right lower extremity demonstrates atherosclerosis throught. Left: 50-74% stenosis noted in the common femoral artery. 50-74% stenosis noted in the superficial femoral artery. Left lower extremity demonstrates atherosclerosis throughout with collateral flow within the mid SFA. Narrowing  of the mid SFA.  See table(s) above for measurements and observations. Electronically signed by Hortencia Pilar MD on 04/10/2020 at 3:52:53 PM.    Final     ASSESSMENT: Iron deficiency anemia.  PLAN:   1.  Iron deficiency anemia: Patient's hemoglobin has significantly improved and is now essentially within normal limits at 12.6.  His iron stores remain decreased, therefore will pursue an additional 510 mg IV Feraheme today.  Colonoscopy and upper endoscopy on September 04, 2018 did not reveal any significant pathology.  Return to clinic in 3 months with repeat laboratory work, further evaluation, and continuation of treatment if needed.  I spent a total of 30 minutes reviewing chart data, face-to-face evaluation with the patient, counseling and coordination of care as detailed above.   Patient expressed understanding and was in agreement with this plan. He also understands that He can call clinic at any time with any questions, concerns, or complaints.    Lloyd Huger, MD   04/12/2020 10:16 AM

## 2020-04-14 ENCOUNTER — Inpatient Hospital Stay: Payer: PPO | Attending: Oncology

## 2020-04-14 ENCOUNTER — Other Ambulatory Visit: Payer: Self-pay

## 2020-04-14 DIAGNOSIS — D509 Iron deficiency anemia, unspecified: Secondary | ICD-10-CM | POA: Diagnosis not present

## 2020-04-14 LAB — CBC WITH DIFFERENTIAL/PLATELET
Abs Immature Granulocytes: 0.04 10*3/uL (ref 0.00–0.07)
Basophils Absolute: 0.1 10*3/uL (ref 0.0–0.1)
Basophils Relative: 1 %
Eosinophils Absolute: 0.2 10*3/uL (ref 0.0–0.5)
Eosinophils Relative: 2 %
HCT: 38.4 % — ABNORMAL LOW (ref 39.0–52.0)
Hemoglobin: 13.4 g/dL (ref 13.0–17.0)
Immature Granulocytes: 1 %
Lymphocytes Relative: 20 %
Lymphs Abs: 1.4 10*3/uL (ref 0.7–4.0)
MCH: 31 pg (ref 26.0–34.0)
MCHC: 34.9 g/dL (ref 30.0–36.0)
MCV: 88.9 fL (ref 80.0–100.0)
Monocytes Absolute: 0.6 10*3/uL (ref 0.1–1.0)
Monocytes Relative: 9 %
Neutro Abs: 4.9 10*3/uL (ref 1.7–7.7)
Neutrophils Relative %: 67 %
Platelets: 195 10*3/uL (ref 150–400)
RBC: 4.32 MIL/uL (ref 4.22–5.81)
RDW: 14 % (ref 11.5–15.5)
WBC: 7.2 10*3/uL (ref 4.0–10.5)
nRBC: 0 % (ref 0.0–0.2)

## 2020-04-14 LAB — IRON AND TIBC
Iron: 71 ug/dL (ref 45–182)
Saturation Ratios: 20 % (ref 17.9–39.5)
TIBC: 357 ug/dL (ref 250–450)
UIBC: 286 ug/dL

## 2020-04-14 LAB — FERRITIN: Ferritin: 30 ng/mL (ref 24–336)

## 2020-04-15 ENCOUNTER — Inpatient Hospital Stay: Payer: PPO | Admitting: Oncology

## 2020-04-15 ENCOUNTER — Inpatient Hospital Stay: Payer: PPO

## 2020-04-16 ENCOUNTER — Encounter (INDEPENDENT_AMBULATORY_CARE_PROVIDER_SITE_OTHER): Payer: Self-pay | Admitting: Vascular Surgery

## 2020-04-16 NOTE — H&P (View-Only) (Signed)
MRN : [id number]  Ivan Reyes is a 74 y.o. (1945-11-02) male who presents with chief complaint of  Chief Complaint  Patient presents with  . Follow-up    ultrasound follow up  .  History of Present Illness:   The patient returns to the office for followup and review of the noninvasive studies. There has been a significant deterioration in the lower extremity symptoms.  The patient notes interval shortening of their claudication distance and development of mild rest pain symptoms. No new ulcers or wounds have occurred since the last visit.  There have been no significant changes to the patient's overall health care.  The patient denies amaurosis fugax or recent TIA symptoms. There are no recent neurological changes noted. The patient denies history of DVT, PE or superficial thrombophlebitis. The patient denies recent episodes of angina or shortness of breath.   Duplex US of the lower extremity arterial system shows moderate stenosis of the SFA on the right and focal >70% stenosis of the SFA on the left  Current Meds  Medication Sig  . Cholecalciferol (VITAMIN D) 2000 units CAPS Take 2,000 Units by mouth every evening.  . clopidogrel (PLAVIX) 75 MG tablet Take 75 mg by mouth daily.  . enalapril (VASOTEC) 20 MG tablet Take 20 mg by mouth 2 (two) times daily.  . fenofibrate micronized (LOFIBRA) 134 MG capsule Take 134 mg by mouth daily before breakfast.   . Glucosamine HCl (GLUCOSAMINE PO) Take 1 tablet by mouth 2 (two) times daily.  . Multiple Vitamins-Minerals (PX COMPLETE SENIOR MULTIVITS) TABS Take 1 tablet by mouth.  . Omega-3 Fatty Acids (OMEGA-3 FISH OIL) 300 MG CAPS Take 300 mg by mouth 2 (two) times daily.  . pantoprazole (PROTONIX) 20 MG tablet Take 20 mg by mouth every morning.  . sildenafil (REVATIO) 20 MG tablet Take 60-100 mg by mouth daily as needed (for erectile dysfunction). Pt states 2 morning and night  . simvastatin (ZOCOR) 40 MG tablet Take 40 mg by mouth  daily at 6 PM.   . vitamin B-12 (CYANOCOBALAMIN) 1000 MCG tablet Take 1,000 mcg by mouth daily.  . vitamin E 400 UNIT capsule Take 400 Units by mouth every evening.     Past Medical History:  Diagnosis Date  . Arthritis   . Coronary artery disease   . GERD (gastroesophageal reflux disease)   . Hyperlipidemia   . Hypertension   . Mitral valve disorder   . Peripheral vascular disease ([place])   . Sleep apnea    use C-PAP    Past Surgical History:  Procedure Laterality Date  . CAROTID ENDARTERECTOMY Bilateral    Dr. Delana Meyer, [facility]  . CARPAL TUNNEL RELEASE Left 12/23/2016   Procedure: CARPAL TUNNEL RELEASE ENDOSCOPIC;  Surgeon: Corky Mull, MD;  Location: [facility] ORS;  Service: Orthopedics;  Laterality: Left;  . CARPAL TUNNEL RELEASE Right 01/13/2017   Procedure: CARPAL TUNNEL RELEASE ENDOSCOPIC;  Surgeon: Corky Mull, MD;  Location: [facility] ORS;  Service: Orthopedics;  Laterality: Right;  . COLONOSCOPY WITH PROPOFOL N/A 09/04/2018   Procedure: COLONOSCOPY WITH PROPOFOL;  Surgeon: Manya Silvas, MD;  Location: [facility] ENDOSCOPY;  Service: Endoscopy;  Laterality: N/A;  . CORONARY ANGIOPLASTY     3 stents  . coronary atherosclerosis of  autologous graft    . ESOPHAGOGASTRODUODENOSCOPY (EGD) WITH PROPOFOL N/A 12/12/2017   Procedure: ESOPHAGOGASTRODUODENOSCOPY (EGD) WITH PROPOFOL;  Surgeon: Manya Silvas, MD;  Location: [facility] ENDOSCOPY;  Service: Endoscopy;  Laterality: N/A;  . ESOPHAGOGASTRODUODENOSCOPY (  EGD) WITH PROPOFOL N/A 09/04/2018   Procedure: ESOPHAGOGASTRODUODENOSCOPY (EGD) WITH PROPOFOL;  Surgeon: Manya Silvas, MD;  Location: [facility] ENDOSCOPY;  Service: Endoscopy;  Laterality: N/A;  . EYE SURGERY Bilateral    Cataract Extraction with IOL  . FRACTURE SURGERY Right    Hip Pinning, Dr. Franchot Mimes, Jr  . JOINT REPLACEMENT     left knee  . KNEE ARTHROPLASTY Left 06/30/2016   Procedure: COMPUTER ASSISTED TOTAL KNEE ARTHROPLASTY;  Surgeon: Dereck Leep, MD;  Location: [facility]  ORS;  Service: Orthopedics;  Laterality: Left;  Marland Kitchen MASS EXCISION Left 01/11/2018   Procedure: EXCISION CYSTIC MASS ANTERIOR KNEE;  Surgeon: Dereck Leep, MD;  Location: [facility] ORS;  Service: Orthopedics;  Laterality: Left;    Social History Social History   Tobacco Use  . Smoking status: Current Every Day Smoker    Packs/day: 0.50    Types: Cigarettes  . Smokeless tobacco: Never Used  Vaping Use  . Vaping Use: Never used  Substance Use Topics  . Alcohol use: Yes    Alcohol/week: 2.0 standard drinks    Types: 2 Cans of beer per week    Comment: 4/ week  . Drug use: No    Family History No family history on file.  No Known Allergies   REVIEW OF SYSTEMS (Negative unless checked)  Constitutional: [] Weight loss  [] Fever  [] Chills Cardiac: [] Chest pain   [] Chest pressure   [] Palpitations   [] Shortness of breath when laying flat   [] Shortness of breath with exertion. Vascular:  [x] Pain in legs with walking   [] Pain in legs at rest  [] History of DVT   [] Phlebitis   [] Swelling in legs   [] Varicose veins   [] Non-healing ulcers Pulmonary:   [] Uses home oxygen   [] Productive cough   [] Hemoptysis   [] Wheeze  [] COPD   [] Asthma Neurologic:  [] Dizziness   [] Seizures   [] History of stroke   [] History of TIA  [] Aphasia   [] Vissual changes   [] Weakness or numbness in arm   [x] Weakness or numbness in leg Musculoskeletal:   [] Joint swelling   [] Joint pain   [] Low back pain Hematologic:  [] Easy bruising  [] Easy bleeding   [] Hypercoagulable state   [] Anemic Gastrointestinal:  [] Diarrhea   [] Vomiting  [] Gastroesophageal reflux/heartburn   [] Difficulty swallowing. Genitourinary:  [] Chronic kidney disease   [] Difficult urination  [] Frequent urination   [] Blood in urine Skin:  [] Rashes   [] Ulcers  Psychological:  [] History of anxiety   []  History of major depression.  Physical Examination  Vitals:   04/10/20 1421  BP: 131/71  Pulse: (!) 56  Resp: 16  Weight: 178 lb (80.7 kg)   Body mass index  is 25.54 kg/m. Gen: WD/WN, NAD Head: [place]/AT, No temporalis wasting.  Ear/Nose/Throat: Hearing grossly intact, nares w/o erythema or drainage Eyes: PER, EOMI, sclera nonicteric.  Neck: Supple, no large masses.   Pulmonary:  Good air movement, no audible wheezing bilaterally, no use of accessory muscles.  Cardiac: RRR, no JVD Vascular:  Vessel Right Left  Radial Palpable Palpable  PT Not Palpable Not Palpable  DP Not Palpable Not Palpable  Gastrointestinal: Non-distended. No guarding/no peritoneal signs.  Musculoskeletal: M/S 5/5 throughout.  No deformity or atrophy.  Neurologic: CN 2-12 intact. Symmetrical.  Speech is fluent. Motor exam as listed above. Psychiatric: Judgment intact, Mood & affect appropriate for pt's clinical situation. Dermatologic: No rashes or ulcers noted.  No changes consistent with cellulitis.   CBC Lab Results  Component Value Date  WBC 7.2 04/14/2020   HGB 13.4 04/14/2020   HCT 38.4 (L) 04/14/2020   MCV 88.9 04/14/2020   PLT 195 04/14/2020    BMET    Component Value Date/Time   NA 137 12/30/2017 1150   K 4.0 12/30/2017 1150   K 4.1 06/28/2014 1603   CL 105 12/30/2017 1150   CO2 24 12/30/2017 1150   GLUCOSE 89 12/30/2017 1150   BUN 17 12/30/2017 1150   CREATININE 1.00 06/08/2019 0858   CALCIUM 9.4 12/30/2017 1150   GFRNONAA >60 12/30/2017 1150   GFRAA >60 12/30/2017 1150   CrCl cannot be calculated (Patient's most recent lab result is older than the maximum 21 days allowed.).  COAG Lab Results  Component Value Date   INR 0.92 06/16/2016    Radiology VAS US CAROTID  Result Date: 04/10/2020 Carotid Arterial Duplex Study Comparison Study:  12/14/2016 Performing Technologist: Charlane Ferretti RT (R)(VS)  Examination Guidelines: A complete evaluation includes B-mode imaging, spectral Doppler, color Doppler, and power Doppler as needed of all accessible portions of each vessel. Bilateral testing is considered an integral part of a complete  examination. Limited examinations for reoccurring indications may be performed as noted.  Right Carotid Findings: +----------+--------+--------+--------+------------------+--------------------+           PSV cm/sEDV cm/sStenosisPlaque DescriptionComments             +----------+--------+--------+--------+------------------+--------------------+ CCA Prox  123     13                                                     +----------+--------+--------+--------+------------------+--------------------+ CCA Mid   133     14                                intimal thickening   +----------+--------+--------+--------+------------------+--------------------+ CCA Distal111     16                                intimal thickening   +----------+--------+--------+--------+------------------+--------------------+ ICA Prox  71      14                                ICA/CCA ratio = .1.0 +----------+--------+--------+--------+------------------+--------------------+ ICA Mid   121     29                                                     +----------+--------+--------+--------+------------------+--------------------+ ICA Distal112     25                                                     +----------+--------+--------+--------+------------------+--------------------+ ECA       166     18                                                     +----------+--------+--------+--------+------------------+--------------------+ +----------+--------+-------+--------+-------------------+  PSV cm/sEDV cmsDescribeArm Pressure (mmHG) +----------+--------+-------+--------+-------------------+ [id number]                                        +----------+--------+-------+--------+-------------------+ +---------+--------+--+--------+-+ VertebralPSV cm/s50EDV cm/s8 +---------+--------+--+--------+-+ Left Carotid Findings:  +----------+--------+--------+--------+------------------+-------------------+           PSV cm/sEDV cm/sStenosisPlaque DescriptionComments            +----------+--------+--------+--------+------------------+-------------------+ CCA Prox  142     19              calcific                              +----------+--------+--------+--------+------------------+-------------------+ CCA Mid   109     20              calcific          intimal thickening  +----------+--------+--------+--------+------------------+-------------------+ CCA Distal108     20                                intimal thickening  +----------+--------+--------+--------+------------------+-------------------+ ICA Prox  56      15                                ICA/CCA ratio = 1.0 +----------+--------+--------+--------+------------------+-------------------+ ICA Mid   88      29                                                    +----------+--------+--------+--------+------------------+-------------------+ ICA Distal104     30                                                    +----------+--------+--------+--------+------------------+-------------------+ ECA       117     12                                                    +----------+--------+--------+--------+------------------+-------------------+ +----------+--------+--------+--------+-------------------+           PSV cm/sEDV cm/sDescribeArm Pressure (mmHG) +----------+--------+--------+--------+-------------------+ [id number]                                         +----------+--------+--------+--------+-------------------+ +---------+--------+---+--------+--+ VertebralPSV cm/s144EDV cm/s43 +---------+--------+---+--------+--+ Summary: Right Carotid: Velocities in the right ICA are consistent with a 40-59%                stenosis. Patent right carotid endarterectomy with no evidence of                restenosis. Left  Carotid: Velocities in the left ICA are consistent with a 1-39% stenosis.               The ECA appears >50% stenosed. Patent left  caotid endarterectomy               with no evidence of restenosis. Vertebrals:  Bilateral vertebral arteries demonstrate antegrade flow. Subclavians: Normal flow hemodynamics were seen in bilateral subclavian              arteries. *See table(s) above for measurements and observations.  Electronically signed by Hortencia Pilar MD on 04/10/2020 at 3:54:16 PM.    Final    VAS [place] LOWER EXTREMITY ARTERIAL DUPLEX  Result Date: 04/10/2020 LOWER EXTREMITY ARTERIAL DUPLEX STUDY  Current ABI: N/A Performing Technologist: Charlane Ferretti RT (R)(VS)  Examination Guidelines: A complete evaluation includes B-mode imaging, spectral Doppler, color Doppler, and power Doppler as needed of all accessible portions of each vessel. Bilateral testing is considered an integral part of a complete examination. Limited examinations for reoccurring indications may be performed as noted.  +-----------+--------+-----+--------+----------+--------+ RIGHT      PSV cm/sRatioStenosisWaveform  Comments +-----------+--------+-----+--------+----------+--------+ CFA Distal 165                  biphasic           +-----------+--------+-----+--------+----------+--------+ DFA        165                  triphasic          +-----------+--------+-----+--------+----------+--------+ SFA Prox   91                   monophasic         +-----------+--------+-----+--------+----------+--------+ SFA Mid    70                   monophasic         +-----------+--------+-----+--------+----------+--------+ SFA Distal 170                  monophasic         +-----------+--------+-----+--------+----------+--------+ POP Prox   93                   monophasic         +-----------+--------+-----+--------+----------+--------+ POP Distal 111                  monophasic          +-----------+--------+-----+--------+----------+--------+ ATA Distal 66                   monophasic         +-----------+--------+-----+--------+----------+--------+ PTA Distal 86                   monophasic         +-----------+--------+-----+--------+----------+--------+ PERO Distal17                   monophasic         +-----------+--------+-----+--------+----------+--------+  +-----------+--------+-----+--------+----------+-------------------+ LEFT       PSV cm/sRatioStenosisWaveform  Comments            +-----------+--------+-----+--------+----------+-------------------+ CFA Distal 226                  biphasic                      +-----------+--------+-----+--------+----------+-------------------+ DFA        135                  biphasic                      +-----------+--------+-----+--------+----------+-------------------+  SFA Prox   254                  biphasic  Narrowing 68 to 254 +-----------+--------+-----+--------+----------+-------------------+ SFA Mid    60                   monophasicCollateral          +-----------+--------+-----+--------+----------+-------------------+ SFA Distal 80                   monophasic                    +-----------+--------+-----+--------+----------+-------------------+ POP Prox   180                  monophasic                    +-----------+--------+-----+--------+----------+-------------------+ POP Distal 74                   monophasic                    +-----------+--------+-----+--------+----------+-------------------+ ATA Distal 8                    monophasic                    +-----------+--------+-----+--------+----------+-------------------+ PTA Distal 44                   monophasic                    +-----------+--------+-----+--------+----------+-------------------+ PERO Distal32                   monophasic                     +-----------+--------+-----+--------+----------+-------------------+  Summary: Right: 30-49% stenosis noted in the superficial femoral artery. Right lower extremity demonstrates atherosclerosis throught. Left: 50-74% stenosis noted in the common femoral artery. 50-74% stenosis noted in the superficial femoral artery. Left lower extremity demonstrates atherosclerosis throughout with collateral flow within the mid SFA. Narrowing of the mid SFA.  See table(s) above for measurements and observations. Electronically signed by Hortencia Pilar MD on 04/10/2020 at 3:52:53 PM.    Final       Assessment/Plan 1. Atherosclerosis of native artery of both lower extremities with intermittent claudication (HCC) Recommend:  The patient has experienced increased symptoms and is now describing lifestyle limiting claudication and mild rest pain of the left leg.   Given the severity of the patient's lower extremity symptoms the patient should undergo angiography and intervention of the left leg.  Risk and benefits were reviewed the patient.  Indications for the procedure were reviewed.  All questions were answered, the patient agrees to proceed.   The patient should continue walking and begin a more formal exercise program.  The patient should continue antiplatelet therapy and aggressive treatment of the lipid abnormalities  The patient will follow up with me after the left leg angiogram.   2. Bilateral carotid artery stenosis Recommend:  Given the patient's asymptomatic subcritical stenosis no further invasive testing or surgery at this time.  Carotid duplex done today shows RICA 40-59% s/p endarterectomy and LICA <36% s/p endarterectomy  Continue antiplatelet therapy as prescribed Continue management of CAD, HTN and Hyperlipidemia Healthy heart diet,  encouraged exercise at least 4 times per week Follow up in 12 months with duplex ultrasound and physical exam  3. Coronary artery disease of native  artery of native heart with stable angina pectoris (HCC) Continue cardiac and antihypertensive medications as already ordered and reviewed, no changes at this time.  Continue statin as ordered and reviewed, no changes at this time  Nitrates PRN for chest pain   4. Essential hypertension Continue antihypertensive medications as already ordered, these medications have been reviewed and there are no changes at this time.   5. Mixed hyperlipidemia Continue statin as ordered and reviewed, no changes at this time     Hortencia Pilar, MD  04/16/2020 6:51 PM

## 2020-04-16 NOTE — Progress Notes (Signed)
MRN : [id number]  Ivan Reyes is a 74 y.o. (1946-07-06) male who presents with chief complaint of  Chief Complaint  Patient presents with  . Follow-up    ultrasound follow up  .  History of Present Illness:   The patient returns to the office for followup and review of the noninvasive studies. There has been a significant deterioration in the lower extremity symptoms.  The patient notes interval shortening of their claudication distance and development of mild rest pain symptoms. No new ulcers or wounds have occurred since the last visit.  There have been no significant changes to the patient's overall health care.  The patient denies amaurosis fugax or recent TIA symptoms. There are no recent neurological changes noted. The patient denies history of DVT, PE or superficial thrombophlebitis. The patient denies recent episodes of angina or shortness of breath.   Duplex US of the lower extremity arterial system shows moderate stenosis of the SFA on the right and focal >70% stenosis of the SFA on the left  Current Meds  Medication Sig  . Cholecalciferol (VITAMIN D) 2000 units CAPS Take 2,000 Units by mouth every evening.  . clopidogrel (PLAVIX) 75 MG tablet Take 75 mg by mouth daily.  . enalapril (VASOTEC) 20 MG tablet Take 20 mg by mouth 2 (two) times daily.  . fenofibrate micronized (LOFIBRA) 134 MG capsule Take 134 mg by mouth daily before breakfast.   . Glucosamine HCl (GLUCOSAMINE PO) Take 1 tablet by mouth 2 (two) times daily.  . Multiple Vitamins-Minerals (PX COMPLETE SENIOR MULTIVITS) TABS Take 1 tablet by mouth.  . Omega-3 Fatty Acids (OMEGA-3 FISH OIL) 300 MG CAPS Take 300 mg by mouth 2 (two) times daily.  . pantoprazole (PROTONIX) 20 MG tablet Take 20 mg by mouth every morning.  . sildenafil (REVATIO) 20 MG tablet Take 60-100 mg by mouth daily as needed (for erectile dysfunction). Pt states 2 morning and night  . simvastatin (ZOCOR) 40 MG tablet Take 40 mg by mouth  daily at 6 PM.   . vitamin B-12 (CYANOCOBALAMIN) 1000 MCG tablet Take 1,000 mcg by mouth daily.  . vitamin E 400 UNIT capsule Take 400 Units by mouth every evening.     Past Medical History:  Diagnosis Date  . Arthritis   . Coronary artery disease   . GERD (gastroesophageal reflux disease)   . Hyperlipidemia   . Hypertension   . Mitral valve disorder   . Peripheral vascular disease ([place])   . Sleep apnea    use C-PAP    Past Surgical History:  Procedure Laterality Date  . CAROTID ENDARTERECTOMY Bilateral    Dr. Delana Meyer, [facility]  . CARPAL TUNNEL RELEASE Left 12/23/2016   Procedure: CARPAL TUNNEL RELEASE ENDOSCOPIC;  Surgeon: Corky Mull, MD;  Location: [facility] ORS;  Service: Orthopedics;  Laterality: Left;  . CARPAL TUNNEL RELEASE Right 01/13/2017   Procedure: CARPAL TUNNEL RELEASE ENDOSCOPIC;  Surgeon: Corky Mull, MD;  Location: [facility] ORS;  Service: Orthopedics;  Laterality: Right;  . COLONOSCOPY WITH PROPOFOL N/A 09/04/2018   Procedure: COLONOSCOPY WITH PROPOFOL;  Surgeon: Manya Silvas, MD;  Location: [facility] ENDOSCOPY;  Service: Endoscopy;  Laterality: N/A;  . CORONARY ANGIOPLASTY     3 stents  . coronary atherosclerosis of  autologous graft    . ESOPHAGOGASTRODUODENOSCOPY (EGD) WITH PROPOFOL N/A 12/12/2017   Procedure: ESOPHAGOGASTRODUODENOSCOPY (EGD) WITH PROPOFOL;  Surgeon: Manya Silvas, MD;  Location: [facility] ENDOSCOPY;  Service: Endoscopy;  Laterality: N/A;  . ESOPHAGOGASTRODUODENOSCOPY (  EGD) WITH PROPOFOL N/A 09/04/2018   Procedure: ESOPHAGOGASTRODUODENOSCOPY (EGD) WITH PROPOFOL;  Surgeon: Manya Silvas, MD;  Location: [facility] ENDOSCOPY;  Service: Endoscopy;  Laterality: N/A;  . EYE SURGERY Bilateral    Cataract Extraction with IOL  . FRACTURE SURGERY Right    Hip Pinning, Dr. Franchot Mimes, Jr  . JOINT REPLACEMENT     left knee  . KNEE ARTHROPLASTY Left 06/30/2016   Procedure: COMPUTER ASSISTED TOTAL KNEE ARTHROPLASTY;  Surgeon: Dereck Leep, MD;  Location: [facility]  ORS;  Service: Orthopedics;  Laterality: Left;  Marland Kitchen MASS EXCISION Left 01/11/2018   Procedure: EXCISION CYSTIC MASS ANTERIOR KNEE;  Surgeon: Dereck Leep, MD;  Location: [facility] ORS;  Service: Orthopedics;  Laterality: Left;    Social History Social History   Tobacco Use  . Smoking status: Current Every Day Smoker    Packs/day: 0.50    Types: Cigarettes  . Smokeless tobacco: Never Used  Vaping Use  . Vaping Use: Never used  Substance Use Topics  . Alcohol use: Yes    Alcohol/week: 2.0 standard drinks    Types: 2 Cans of beer per week    Comment: 4/ week  . Drug use: No    Family History No family history on file.  No Known Allergies   REVIEW OF SYSTEMS (Negative unless checked)  Constitutional: [] Weight loss  [] Fever  [] Chills Cardiac: [] Chest pain   [] Chest pressure   [] Palpitations   [] Shortness of breath when laying flat   [] Shortness of breath with exertion. Vascular:  [x] Pain in legs with walking   [] Pain in legs at rest  [] History of DVT   [] Phlebitis   [] Swelling in legs   [] Varicose veins   [] Non-healing ulcers Pulmonary:   [] Uses home oxygen   [] Productive cough   [] Hemoptysis   [] Wheeze  [] COPD   [] Asthma Neurologic:  [] Dizziness   [] Seizures   [] History of stroke   [] History of TIA  [] Aphasia   [] Vissual changes   [] Weakness or numbness in arm   [x] Weakness or numbness in leg Musculoskeletal:   [] Joint swelling   [] Joint pain   [] Low back pain Hematologic:  [] Easy bruising  [] Easy bleeding   [] Hypercoagulable state   [] Anemic Gastrointestinal:  [] Diarrhea   [] Vomiting  [] Gastroesophageal reflux/heartburn   [] Difficulty swallowing. Genitourinary:  [] Chronic kidney disease   [] Difficult urination  [] Frequent urination   [] Blood in urine Skin:  [] Rashes   [] Ulcers  Psychological:  [] History of anxiety   []  History of major depression.  Physical Examination  Vitals:   04/10/20 1421  BP: 131/71  Pulse: (!) 56  Resp: 16  Weight: 178 lb (80.7 kg)   Body mass index  is 25.54 kg/m. Gen: WD/WN, NAD Head: [place]/AT, No temporalis wasting.  Ear/Nose/Throat: Hearing grossly intact, nares w/o erythema or drainage Eyes: PER, EOMI, sclera nonicteric.  Neck: Supple, no large masses.   Pulmonary:  Good air movement, no audible wheezing bilaterally, no use of accessory muscles.  Cardiac: RRR, no JVD Vascular:  Vessel Right Left  Radial Palpable Palpable  PT Not Palpable Not Palpable  DP Not Palpable Not Palpable  Gastrointestinal: Non-distended. No guarding/no peritoneal signs.  Musculoskeletal: M/S 5/5 throughout.  No deformity or atrophy.  Neurologic: CN 2-12 intact. Symmetrical.  Speech is fluent. Motor exam as listed above. Psychiatric: Judgment intact, Mood & affect appropriate for pt's clinical situation. Dermatologic: No rashes or ulcers noted.  No changes consistent with cellulitis.   CBC Lab Results  Component Value Date  WBC 7.2 04/14/2020   HGB 13.4 04/14/2020   HCT 38.4 (L) 04/14/2020   MCV 88.9 04/14/2020   PLT 195 04/14/2020    BMET    Component Value Date/Time   NA 137 12/30/2017 1150   K 4.0 12/30/2017 1150   K 4.1 06/28/2014 1603   CL 105 12/30/2017 1150   CO2 24 12/30/2017 1150   GLUCOSE 89 12/30/2017 1150   BUN 17 12/30/2017 1150   CREATININE 1.00 06/08/2019 0858   CALCIUM 9.4 12/30/2017 1150   GFRNONAA >60 12/30/2017 1150   GFRAA >60 12/30/2017 1150   CrCl cannot be calculated (Patient's most recent lab result is older than the maximum 21 days allowed.).  COAG Lab Results  Component Value Date   INR 0.92 06/16/2016    Radiology VAS US CAROTID  Result Date: 04/10/2020 Carotid Arterial Duplex Study Comparison Study:  12/14/2016 Performing Technologist: Charlane Ferretti RT (R)(VS)  Examination Guidelines: A complete evaluation includes B-mode imaging, spectral Doppler, color Doppler, and power Doppler as needed of all accessible portions of each vessel. Bilateral testing is considered an integral part of a complete  examination. Limited examinations for reoccurring indications may be performed as noted.  Right Carotid Findings: +----------+--------+--------+--------+------------------+--------------------+           PSV cm/sEDV cm/sStenosisPlaque DescriptionComments             +----------+--------+--------+--------+------------------+--------------------+ CCA Prox  123     13                                                     +----------+--------+--------+--------+------------------+--------------------+ CCA Mid   133     14                                intimal thickening   +----------+--------+--------+--------+------------------+--------------------+ CCA Distal111     16                                intimal thickening   +----------+--------+--------+--------+------------------+--------------------+ ICA Prox  71      14                                ICA/CCA ratio = .1.0 +----------+--------+--------+--------+------------------+--------------------+ ICA Mid   121     29                                                     +----------+--------+--------+--------+------------------+--------------------+ ICA Distal112     25                                                     +----------+--------+--------+--------+------------------+--------------------+ ECA       166     18                                                     +----------+--------+--------+--------+------------------+--------------------+ +----------+--------+-------+--------+-------------------+  PSV cm/sEDV cmsDescribeArm Pressure (mmHG) +----------+--------+-------+--------+-------------------+ [id number]                                        +----------+--------+-------+--------+-------------------+ +---------+--------+--+--------+-+ VertebralPSV cm/s50EDV cm/s8 +---------+--------+--+--------+-+ Left Carotid Findings:  +----------+--------+--------+--------+------------------+-------------------+           PSV cm/sEDV cm/sStenosisPlaque DescriptionComments            +----------+--------+--------+--------+------------------+-------------------+ CCA Prox  142     19              calcific                              +----------+--------+--------+--------+------------------+-------------------+ CCA Mid   109     20              calcific          intimal thickening  +----------+--------+--------+--------+------------------+-------------------+ CCA Distal108     20                                intimal thickening  +----------+--------+--------+--------+------------------+-------------------+ ICA Prox  56      15                                ICA/CCA ratio = 1.0 +----------+--------+--------+--------+------------------+-------------------+ ICA Mid   88      29                                                    +----------+--------+--------+--------+------------------+-------------------+ ICA Distal104     30                                                    +----------+--------+--------+--------+------------------+-------------------+ ECA       117     12                                                    +----------+--------+--------+--------+------------------+-------------------+ +----------+--------+--------+--------+-------------------+           PSV cm/sEDV cm/sDescribeArm Pressure (mmHG) +----------+--------+--------+--------+-------------------+ [id number]                                         +----------+--------+--------+--------+-------------------+ +---------+--------+---+--------+--+ VertebralPSV cm/s144EDV cm/s43 +---------+--------+---+--------+--+ Summary: Right Carotid: Velocities in the right ICA are consistent with a 40-59%                stenosis. Patent right carotid endarterectomy with no evidence of                restenosis. Left  Carotid: Velocities in the left ICA are consistent with a 1-39% stenosis.               The ECA appears >50% stenosed. Patent left  caotid endarterectomy               with no evidence of restenosis. Vertebrals:  Bilateral vertebral arteries demonstrate antegrade flow. Subclavians: Normal flow hemodynamics were seen in bilateral subclavian              arteries. *See table(s) above for measurements and observations.  Electronically signed by Hortencia Pilar MD on 04/10/2020 at 3:54:16 PM.    Final    VAS [place] LOWER EXTREMITY ARTERIAL DUPLEX  Result Date: 04/10/2020 LOWER EXTREMITY ARTERIAL DUPLEX STUDY  Current ABI: N/A Performing Technologist: Charlane Ferretti RT (R)(VS)  Examination Guidelines: A complete evaluation includes B-mode imaging, spectral Doppler, color Doppler, and power Doppler as needed of all accessible portions of each vessel. Bilateral testing is considered an integral part of a complete examination. Limited examinations for reoccurring indications may be performed as noted.  +-----------+--------+-----+--------+----------+--------+ RIGHT      PSV cm/sRatioStenosisWaveform  Comments +-----------+--------+-----+--------+----------+--------+ CFA Distal 165                  biphasic           +-----------+--------+-----+--------+----------+--------+ DFA        165                  triphasic          +-----------+--------+-----+--------+----------+--------+ SFA Prox   91                   monophasic         +-----------+--------+-----+--------+----------+--------+ SFA Mid    70                   monophasic         +-----------+--------+-----+--------+----------+--------+ SFA Distal 170                  monophasic         +-----------+--------+-----+--------+----------+--------+ POP Prox   93                   monophasic         +-----------+--------+-----+--------+----------+--------+ POP Distal 111                  monophasic          +-----------+--------+-----+--------+----------+--------+ ATA Distal 66                   monophasic         +-----------+--------+-----+--------+----------+--------+ PTA Distal 86                   monophasic         +-----------+--------+-----+--------+----------+--------+ PERO Distal17                   monophasic         +-----------+--------+-----+--------+----------+--------+  +-----------+--------+-----+--------+----------+-------------------+ LEFT       PSV cm/sRatioStenosisWaveform  Comments            +-----------+--------+-----+--------+----------+-------------------+ CFA Distal 226                  biphasic                      +-----------+--------+-----+--------+----------+-------------------+ DFA        135                  biphasic                      +-----------+--------+-----+--------+----------+-------------------+  SFA Prox   254                  biphasic  Narrowing 68 to 254 +-----------+--------+-----+--------+----------+-------------------+ SFA Mid    60                   monophasicCollateral          +-----------+--------+-----+--------+----------+-------------------+ SFA Distal 80                   monophasic                    +-----------+--------+-----+--------+----------+-------------------+ POP Prox   180                  monophasic                    +-----------+--------+-----+--------+----------+-------------------+ POP Distal 74                   monophasic                    +-----------+--------+-----+--------+----------+-------------------+ ATA Distal 8                    monophasic                    +-----------+--------+-----+--------+----------+-------------------+ PTA Distal 44                   monophasic                    +-----------+--------+-----+--------+----------+-------------------+ PERO Distal32                   monophasic                     +-----------+--------+-----+--------+----------+-------------------+  Summary: Right: 30-49% stenosis noted in the superficial femoral artery. Right lower extremity demonstrates atherosclerosis throught. Left: 50-74% stenosis noted in the common femoral artery. 50-74% stenosis noted in the superficial femoral artery. Left lower extremity demonstrates atherosclerosis throughout with collateral flow within the mid SFA. Narrowing of the mid SFA.  See table(s) above for measurements and observations. Electronically signed by Hortencia Pilar MD on 04/10/2020 at 3:52:53 PM.    Final       Assessment/Plan 1. Atherosclerosis of native artery of both lower extremities with intermittent claudication (HCC) Recommend:  The patient has experienced increased symptoms and is now describing lifestyle limiting claudication and mild rest pain of the left leg.   Given the severity of the patient's lower extremity symptoms the patient should undergo angiography and intervention of the left leg.  Risk and benefits were reviewed the patient.  Indications for the procedure were reviewed.  All questions were answered, the patient agrees to proceed.   The patient should continue walking and begin a more formal exercise program.  The patient should continue antiplatelet therapy and aggressive treatment of the lipid abnormalities  The patient will follow up with me after the left leg angiogram.   2. Bilateral carotid artery stenosis Recommend:  Given the patient's asymptomatic subcritical stenosis no further invasive testing or surgery at this time.  Carotid duplex done today shows RICA 40-59% s/p endarterectomy and LICA <33% s/p endarterectomy  Continue antiplatelet therapy as prescribed Continue management of CAD, HTN and Hyperlipidemia Healthy heart diet,  encouraged exercise at least 4 times per week Follow up in 12 months with duplex ultrasound and physical exam  3. Coronary artery disease of native  artery of native heart with stable angina pectoris (HCC) Continue cardiac and antihypertensive medications as already ordered and reviewed, no changes at this time.  Continue statin as ordered and reviewed, no changes at this time  Nitrates PRN for chest pain   4. Essential hypertension Continue antihypertensive medications as already ordered, these medications have been reviewed and there are no changes at this time.   5. Mixed hyperlipidemia Continue statin as ordered and reviewed, no changes at this time     Hortencia Pilar, MD  04/16/2020 6:51 PM

## 2020-04-18 ENCOUNTER — Other Ambulatory Visit: Admission: RE | Admit: 2020-04-18 | Payer: PPO | Source: Ambulatory Visit

## 2020-04-21 ENCOUNTER — Other Ambulatory Visit (INDEPENDENT_AMBULATORY_CARE_PROVIDER_SITE_OTHER): Payer: Self-pay | Admitting: Nurse Practitioner

## 2020-04-22 NOTE — Telephone Encounter (Addendum)
Spoke with the patient and he has been rescheduled to 05/06/20 with a 9:00 am arrival time to the [facility] with Dr. Delana Meyer. Covid testing on 05/02/20 between 8-1 pm at the [place]. Pre-procedure instructions were discussed and will be mailed.

## 2020-05-02 ENCOUNTER — Other Ambulatory Visit: Payer: Self-pay

## 2020-05-02 ENCOUNTER — Other Ambulatory Visit
Admission: RE | Admit: 2020-05-02 | Discharge: 2020-05-02 | Disposition: A | Discharge status: Home / Self Care | Payer: PPO | Source: Ambulatory Visit | Attending: Vascular Surgery | Admitting: Vascular Surgery

## 2020-05-02 DIAGNOSIS — Z01812 Encounter for preprocedural laboratory examination: Secondary | ICD-10-CM | POA: Insufficient documentation

## 2020-05-02 DIAGNOSIS — Z20822 Contact with and (suspected) exposure to covid-19: Secondary | ICD-10-CM | POA: Insufficient documentation

## 2020-05-03 LAB — SARS CORONAVIRUS 2 (TAT 6-24 HRS): SARS Coronavirus 2: NEGATIVE

## 2020-05-05 ENCOUNTER — Other Ambulatory Visit (INDEPENDENT_AMBULATORY_CARE_PROVIDER_SITE_OTHER): Payer: Self-pay | Admitting: Nurse Practitioner

## 2020-05-05 NOTE — Progress Notes (Signed)
Spoke with patient to reschedule procedure on 9/21 due to emegergency add ons.  Patient will be here at 11am for a 12n procedure start time on 9/21 for his lower extremity angiogram.

## 2020-05-06 ENCOUNTER — Encounter: Payer: Self-pay | Admitting: Vascular Surgery

## 2020-05-06 ENCOUNTER — Ambulatory Visit
Admission: RE | Admit: 2020-05-06 | Discharge: 2020-05-06 | Disposition: A | Discharge status: Home / Self Care | Payer: PPO | Attending: Vascular Surgery | Admitting: Vascular Surgery

## 2020-05-06 ENCOUNTER — Other Ambulatory Visit: Payer: Self-pay

## 2020-05-06 ENCOUNTER — Encounter
Admission: RE | Disposition: A | Discharge status: Home / Self Care | Payer: Self-pay | Source: Home / Self Care | Attending: Vascular Surgery

## 2020-05-06 DIAGNOSIS — I10 Essential (primary) hypertension: Secondary | ICD-10-CM | POA: Diagnosis not present

## 2020-05-06 DIAGNOSIS — I70222 Atherosclerosis of native arteries of extremities with rest pain, left leg: Secondary | ICD-10-CM | POA: Insufficient documentation

## 2020-05-06 DIAGNOSIS — Z79899 Other long term (current) drug therapy: Secondary | ICD-10-CM | POA: Insufficient documentation

## 2020-05-06 DIAGNOSIS — G4730 Sleep apnea, unspecified: Secondary | ICD-10-CM | POA: Diagnosis not present

## 2020-05-06 DIAGNOSIS — K219 Gastro-esophageal reflux disease without esophagitis: Secondary | ICD-10-CM | POA: Diagnosis not present

## 2020-05-06 DIAGNOSIS — I25118 Atherosclerotic heart disease of native coronary artery with other forms of angina pectoris: Secondary | ICD-10-CM | POA: Insufficient documentation

## 2020-05-06 DIAGNOSIS — F17210 Nicotine dependence, cigarettes, uncomplicated: Secondary | ICD-10-CM | POA: Insufficient documentation

## 2020-05-06 DIAGNOSIS — I70213 Atherosclerosis of native arteries of extremities with intermittent claudication, bilateral legs: Secondary | ICD-10-CM | POA: Diagnosis not present

## 2020-05-06 DIAGNOSIS — I70219 Atherosclerosis of native arteries of extremities with intermittent claudication, unspecified extremity: Secondary | ICD-10-CM

## 2020-05-06 DIAGNOSIS — I6523 Occlusion and stenosis of bilateral carotid arteries: Secondary | ICD-10-CM | POA: Diagnosis not present

## 2020-05-06 DIAGNOSIS — Z7902 Long term (current) use of antithrombotics/antiplatelets: Secondary | ICD-10-CM | POA: Insufficient documentation

## 2020-05-06 DIAGNOSIS — E782 Mixed hyperlipidemia: Secondary | ICD-10-CM | POA: Insufficient documentation

## 2020-05-06 HISTORY — PX: LOWER EXTREMITY ANGIOGRAPHY: CATH118251

## 2020-05-06 LAB — BUN: BUN: 13 mg/dL (ref 8–23)

## 2020-05-06 LAB — CREATININE, SERUM
Creatinine, Ser: 1 mg/dL (ref 0.61–1.24)
GFR calc Af Amer: >60 mL/min (ref >60)
GFR calc non Af Amer: >60 mL/min (ref >60)

## 2020-05-06 SURGERY — LOWER EXTREMITY ANGIOGRAPHY
Anesthesia: Moderate Sedation | Laterality: Left

## 2020-05-06 MED ORDER — MIDAZOLAM HCL 5 MG/5ML IJ SOLN
INTRAMUSCULAR | Status: AC
  Filled 2020-05-06: qty 5

## 2020-05-06 MED ORDER — FENTANYL CITRATE (PF) 100 MCG/2ML IJ SOLN
INTRAMUSCULAR | Status: AC
  Filled 2020-05-06: qty 2

## 2020-05-06 MED ORDER — HYDRALAZINE HCL 20 MG/ML IJ SOLN
INTRAMUSCULAR | Status: AC
  Filled 2020-05-06: qty 1

## 2020-05-06 MED ORDER — SODIUM CHLORIDE 0.9 % IV SOLN: 250.0000 mL | INTRAVENOUS | Status: DC | PRN

## 2020-05-06 MED ORDER — METHYLPREDNISOLONE SODIUM SUCC 125 MG IJ SOLR: 125.0000 mg | Freq: Once | INTRAMUSCULAR | Status: DC | PRN

## 2020-05-06 MED ORDER — DIPHENHYDRAMINE HCL 50 MG/ML IJ SOLN
INTRAMUSCULAR | Status: AC
  Filled 2020-05-06: qty 1

## 2020-05-06 MED ORDER — CEFAZOLIN SODIUM-DEXTROSE 2-4 GM/100ML-% IV SOLN
2.0000 g | Freq: Once | INTRAVENOUS | Status: AC
  Administered 2020-05-06: 2 g via INTRAVENOUS

## 2020-05-06 MED ORDER — HYDRALAZINE HCL 20 MG/ML IJ SOLN
INTRAMUSCULAR | Status: DC | PRN
  Administered 2020-05-06: 10 mg via INTRAVENOUS

## 2020-05-06 MED ORDER — ONDANSETRON HCL 4 MG/2ML IJ SOLN: 4.0000 mg | Freq: Four times a day (QID) | INTRAMUSCULAR | Status: DC | PRN

## 2020-05-06 MED ORDER — ACETAMINOPHEN 325 MG PO TABS: 650.0000 mg | ORAL_TABLET | ORAL | Status: DC | PRN

## 2020-05-06 MED ORDER — HYDROMORPHONE HCL 1 MG/ML IJ SOLN: 1.0000 mg | Freq: Once | INTRAMUSCULAR | Status: DC | PRN

## 2020-05-06 MED ORDER — HEPARIN SODIUM (PORCINE) 1000 UNIT/ML IJ SOLN
INTRAMUSCULAR | Status: DC | PRN
  Administered 2020-05-06: 5000 [IU] via INTRAVENOUS

## 2020-05-06 MED ORDER — HYDRALAZINE HCL 20 MG/ML IJ SOLN: 5.0000 mg | INTRAMUSCULAR | Status: DC | PRN

## 2020-05-06 MED ORDER — DIPHENHYDRAMINE HCL 50 MG/ML IJ SOLN
INTRAMUSCULAR | Status: DC | PRN
  Administered 2020-05-06: 25 mg via INTRAVENOUS

## 2020-05-06 MED ORDER — CLOPIDOGREL BISULFATE 75 MG PO TABS: 150.0000 mg | ORAL_TABLET | ORAL | Status: AC

## 2020-05-06 MED ORDER — SODIUM CHLORIDE 0.9 % IV SOLN: INTRAVENOUS | Status: DC

## 2020-05-06 MED ORDER — CLOPIDOGREL BISULFATE 75 MG PO TABS
ORAL_TABLET | ORAL | Status: AC
  Administered 2020-05-06: 150 mg via ORAL
  Filled 2020-05-06: qty 2

## 2020-05-06 MED ORDER — FAMOTIDINE 20 MG PO TABS: 40.0000 mg | ORAL_TABLET | Freq: Once | ORAL | Status: DC | PRN

## 2020-05-06 MED ORDER — ASPIRIN EC 325 MG PO TBEC
DELAYED_RELEASE_TABLET | ORAL | Status: AC
  Filled 2020-05-06: qty 1

## 2020-05-06 MED ORDER — OXYCODONE HCL 5 MG PO TABS
ORAL_TABLET | ORAL | Status: AC
  Filled 2020-05-06: qty 1

## 2020-05-06 MED ORDER — ASPIRIN 325 MG PO TABS
325.0000 mg | ORAL_TABLET | Freq: Every day | ORAL | Status: DC
  Administered 2020-05-06: 325 mg via ORAL
  Filled 2020-05-06: qty 1

## 2020-05-06 MED ORDER — MIDAZOLAM HCL 2 MG/ML PO SYRP: 8.0000 mg | ORAL_SOLUTION | Freq: Once | ORAL | Status: DC | PRN

## 2020-05-06 MED ORDER — IODIXANOL 320 MG/ML IV SOLN
INTRAVENOUS | Status: DC | PRN
  Administered 2020-05-06: 85 mL via INTRA_ARTERIAL

## 2020-05-06 MED ORDER — LABETALOL HCL 5 MG/ML IV SOLN: 10.0000 mg | INTRAVENOUS | Status: DC | PRN

## 2020-05-06 MED ORDER — MIDAZOLAM HCL 2 MG/2ML IJ SOLN
INTRAMUSCULAR | Status: DC | PRN
  Administered 2020-05-06: 2 mg via INTRAVENOUS
  Administered 2020-05-06: 0.5 mg via INTRAVENOUS

## 2020-05-06 MED ORDER — HEPARIN SODIUM (PORCINE) 1000 UNIT/ML IJ SOLN
INTRAMUSCULAR | Status: AC
  Filled 2020-05-06: qty 1

## 2020-05-06 MED ORDER — OXYCODONE HCL 5 MG PO TABS
5.0000 mg | ORAL_TABLET | ORAL | Status: DC | PRN
  Administered 2020-05-06: 5 mg via ORAL

## 2020-05-06 MED ORDER — MORPHINE SULFATE (PF) 4 MG/ML IV SOLN: 2.0000 mg | INTRAVENOUS | Status: DC | PRN

## 2020-05-06 MED ORDER — SODIUM CHLORIDE 0.9% FLUSH: 3.0000 mL | INTRAVENOUS | Status: DC | PRN

## 2020-05-06 MED ORDER — SODIUM CHLORIDE 0.9% FLUSH: 3.0000 mL | Freq: Two times a day (BID) | INTRAVENOUS | Status: DC

## 2020-05-06 MED ORDER — SODIUM CHLORIDE 0.9 % IV SOLN
INTRAVENOUS | Status: DC
  Administered 2020-05-06: 1000 mL via INTRAVENOUS

## 2020-05-06 MED ORDER — DIPHENHYDRAMINE HCL 50 MG/ML IJ SOLN: 50.0000 mg | Freq: Once | INTRAMUSCULAR | Status: DC | PRN

## 2020-05-06 MED ORDER — FENTANYL CITRATE (PF) 100 MCG/2ML IJ SOLN
INTRAMUSCULAR | Status: DC | PRN
Start: 2020-05-06 — End: 2020-05-06
  Administered 2020-05-06: 25 ug via INTRAVENOUS
  Administered 2020-05-06: 50 ug via INTRAVENOUS

## 2020-05-06 SURGICAL SUPPLY — 33 items
BALLN LUTONIX 018 4X40X130 (BALLOONS) ×3
BALLN LUTONIX 5X120X130 (BALLOONS) ×3
BALLN LUTONIX 5X220X130 (BALLOONS) ×6
BALLN LUTONIX DCB 6X40X130 (BALLOONS) ×3
BALLN ULTRASCORE 014 4X200X150 (BALLOONS) ×3
BALLN ULTRVRSE 3X300X150 (BALLOONS) ×3
BALLN ULTRVRSE 4X40X75C (BALLOONS) ×3
BALLOON LUTONIX 018 4X40X130 (BALLOONS) ×1 IMPLANT
BALLOON LUTONIX 5X120X130 (BALLOONS) ×1 IMPLANT
BALLOON LUTONIX 5X220X130 (BALLOONS) ×2 IMPLANT
BALLOON LUTONIX DCB 6X40X130 (BALLOONS) ×1 IMPLANT
BALLOON ULTRSCRE 014 4X200X150 (BALLOONS) ×1 IMPLANT
BALLOON ULTRVRSE 3X300X150 (BALLOONS) ×1 IMPLANT
BALLOON ULTRVRSE 4X40X75C (BALLOONS) ×1 IMPLANT
CATH ANGIO 5F PIGTAIL 65CM (CATHETERS) ×3 IMPLANT
CATH BERNSTEIN 5FR 130CM (CATHETERS) ×3 IMPLANT
CATH CROSSER 14S OTW 146CM (CATHETERS) ×3 IMPLANT
CATH SKICK ANG 70CM (CATHETERS) ×3 IMPLANT
DEVICE STARCLOSE SE CLOSURE (Vascular Products) ×3 IMPLANT
GLIDEWIRE ADV .035X260CM (WIRE) ×3 IMPLANT
KIT ENCORE 26 ADVANTAGE (KITS) ×3 IMPLANT
KIT FLOWMATE PROCEDURAL (KITS) ×3 IMPLANT
LIFESTENT SOLO 6X200X135 (Permanent Stent) ×6 IMPLANT
NEEDLE ENTRY 21GA 7CM ECHOTIP (NEEDLE) ×3 IMPLANT
PACK ANGIOGRAPHY (CUSTOM PROCEDURE TRAY) ×3 IMPLANT
SET INTRO CAPELLA COAXIAL (SET/KITS/TRAYS/PACK) ×3 IMPLANT
SHEATH BRITE TIP 5FRX11 (SHEATH) ×3 IMPLANT
SHEATH FLEXOR ANSEL2 7FRX45 (SHEATH) ×3 IMPLANT
STENT LIFESTENT 5F 6X100X135 (Permanent Stent) ×3 IMPLANT
TUBING CONTRAST HIGH PRESS 72 (TUBING) ×6 IMPLANT
WIRE J 3MM .035X145CM (WIRE) ×3 IMPLANT
WIRE MAGIC TORQUE 315CM (WIRE) ×3 IMPLANT
WIRE RUNTHROUGH .014X300CM (WIRE) ×6 IMPLANT

## 2020-05-06 NOTE — Op Note (Signed)
[facility] Percutaneous Study/Intervention Procedural Note   Date of Surgery: 05/06/2020  Surgeon:  Katha Cabal, MD.  Pre-operative Diagnosis: Atherosclerotic occlusive disease bilateral lower extremities with lifestyle limiting claudication and mild rest pain symptoms of the left  Post-operative diagnosis: Same  Procedure(s) Performed: 1. Introduction catheter into left lower extremity 3rd order catheter placement  2. Contrast injection left lower extremity for distal runoff   3. Crosser atherectomy of the left SFA and popliteal arteries 4.  Percutaneous transluminal angioplasty and stent placement left superficial femoral artery and popliteal to 5 mm             5.    Percutaneous transluminal angioplasty of the left tibial peroneal trunk to 4 mm with a Lutonix drug-eluting balloon                                  6.    Star close closure right common femoral arteriotomy    Anesthesia: Conscious sedation was administered under my direct supervision by the interventional radiology RN. IV Versed plus fentanyl were utilized. Continuous ECG, pulse oximetry and blood pressure was monitored throughout the entire procedure. Conscious sedation was for a total of 90 minutes.  Sheath: 7 [place] Ansell right common femoral retrograde  Contrast: 85 cc  Fluoroscopy Time: 17.3 minutes  Indications: Ivan Reyes presents with increasing pain particularly of his left lower extremity.  He has known atherosclerotic occlusive disease.  Noninvasive studies demonstrate significant deterioration.  The patient is requesting that anything that can be done to help his walking be done because his leg is causing him so much debility.  The risks and benefits are reviewed all questions answered patient agrees to proceed with angiography and intervention for improvement in his lifestyle limiting claudication and elimination of  his mild rest pain.  Procedure: Ivan Reyes is a 74 y.o. y.o. male who was identified and appropriate procedural time out was performed. The patient was then placed supine on the table and prepped and draped in the usual sterile fashion.   Ultrasound was placed in the sterile sleeve and the right groin was evaluated the right common femoral artery was echolucent and pulsatile indicating patency.  Image was recorded for the permanent record and under real-time visualization a microneedle was inserted into the common femoral artery microwire followed by a micro-sheath.  A J-wire was then advanced through the micro-sheath and a  5 [place] sheath was then inserted over a J-wire. J-wire was then advanced and a 5 French pigtail catheter was positioned at the level of T12. AP projection of the aorta was then obtained. Pigtail catheter was repositioned to above the bifurcation and a RAO view of the pelvis was obtained.  Subsequently a pigtail catheter with the stiff angle Glidewire was used to cross the aortic bifurcation the catheter wire were advanced down into the left distal external iliac artery. Oblique view of the femoral bifurcation was then obtained and subsequently the wire was reintroduced and the pigtail catheter negotiated into the SFA representing third order catheter placement. Distal runoff was then performed.  Diagnostic interpretation: The abdominal aorta is opacified with a bolus injection of contrast.  It is diffusely calcified but there are no hemodynamically significant stenoses.  The aortic bifurcation is widely patent.  The common and external iliac arteries are patent again diffusely calcified but no focal hemodynamically significant stenoses are identified.  The left  common femoral is widely patent.  At the femoral bifurcation there is a 50% stenosis at the origin of the profunda distal to this the profunda femoris is widely patent.  There is a 70% stenosis at the origin of the  SFA distal to this lesion the SFA is extensively diseased and tapers to an occlusion at Hunter's canal the mid popliteal is reconstituted in the mid popliteal there are tandem greater than 80% stenoses located behind his total knee replacement.  The distal several centimeters of popliteal are widely paten think scar sending it t anterior tibial is occluded shortly after its takeoff and remains occluded throughout its course the tibioperoneal trunk demonstrates a greater than 90% stenosis proximally distally it is patent filling the posterior tibial and peroneal which appear patent to the foot.  Based on these images I elected to move forward with intervention.  5000 units of heparin was then given and allowed to circulate and a 7 [place] Ansell sheath was advanced up and over the bifurcation and positioned in the femoral artery  The 14 S Crosser catheter was then prepped on the field and a angled side kick catheter was advanced into the cul-de-sac of the SFA under magnified imaging in the LAO projection. Using the Crosser catheter the occlusion of the SFA and popliteal was negotiated.  Injection of contrast confirmed intraluminal positioning.  A 0.014 run-through wire was then advanced through the side-port of the Crosser and the wire was negotiated into the posterior tibial.  Crosser was then removed.    Working from distal to more proximal initially I advanced a 3 mm x 30 cm Ultraverse balloon from the distal popliteal all the way to the common femoral.  2 separate inflations were required each to 10 to 12 atm for approximately 1 minute.  The detector was then repositioned over the tibioperoneal trunk and a magnified image was obtained.  A 4 mm x 40 mm Lutonix drug-eluting balloon was advanced across this lesion inflated to 8 atm for 1 full minute.  Follow-up imaging demonstrated less than 10% residual stenosis at this location.  I then repositioned the detector over the popliteal and SFA.  A 4 mm by  the 20 cm ultra score balloon was then used to angioplasty the popliteal and SFA beginning in the distal popliteal 3 separate inflations were performed again 10 to 12 atm each for approximately 1 minute.  Follow-up imaging demonstrated greater than 50% stenosis throughout the SFA popliteal in particular in the occluded segment and the critical lesions behind the knee replacement.  Given this finding a 6 mm x 200 life stent was deployed from the mid to distal popliteal extending proximally this was then postdilated to 5 mm using a 5 mm x 220 mm Lutonix drug-eluting balloon inflated to 10 atm for 1 minute.  A second 6 mm x 200 mm life stent was deployed again postdilated in a similar fashion with a 5 mm Lutonix drug-eluting balloon.  Lastly a 6 mm x 100 mm life stent was deployed up to the origin of the SFA.  Initially this stent was postdilated with a 5 mm x 100 mm Lutonix drug-eluting balloon inflated to 10 atm.  After this a 6 mm x 40 mm Lutonix drug-eluting balloon was centered over the origin and inflated to 8 atm for 2 full minutes.  Distal runoff was then reassessed from the common femoral to the foot.  After review of these images the sheath is pulled into the right external iliac  oblique of the common femoral is obtained and a Star close device deployed. There no immediate Complications.  Findings:  The abdominal aorta is opacified with a bolus injection of contrast.  It is diffusely calcified but there are no hemodynamically significant stenoses.  The aortic bifurcation is widely patent.  The common and external iliac arteries are patent again diffusely calcified but no focal hemodynamically significant stenoses are identified.  The left common femoral is widely patent.  At the femoral bifurcation there is a 50% stenosis at the origin of the profunda distal to this the profunda femoris is widely patent.  There is a 70% stenosis at the origin of the SFA distal to this lesion the SFA is extensively  diseased and tapers to an occlusion at Hunter's canal the mid popliteal is reconstituted in the mid popliteal there are tandem greater than 80% stenoses located behind his total knee replacement.  The distal several centimeters of popliteal are widely patent anterior tibial is occluded shortly after its takeoff and remains occluded throughout its course the tibioperoneal trunk demonstrates a greater than 90% stenosis proximally distally it is patent filling the posterior tibial and peroneal which appear patent to the foot.  Following crosser atherectomy there is recanalization of the SFA and popliteal.  Following angioplasty of the SFA and popliteal there is greater than 50% residual stenosis and therefore life stents are deployed as described above and postdilated to 5 mm.  The proximal origin of the SFA was postdilated with a 6 mm Lutonix drug-eluting balloon.  Following this there is less than 10% residual stenosis  The tibioperoneal trunk was treated with a 4 mm Lutonix balloon angioplasty with less than 10% residual stenosis.    Disposition: Patient was taken to the recovery room in stable condition having tolerated the procedure well.  Ivan Reyes 05/06/2020,2:06 PM

## 2020-05-06 NOTE — Interval H&P Note (Signed)
History and Physical Interval Note:  05/06/2020 12:17 PM  Ivan Reyes  has presented today for surgery, with the diagnosis of LLE Angiography   ASO with Claudication   BARD Rep  cc: M Godley, S Willey   Pt to have Covid test on 04-18-20.  The various methods of treatment have been discussed with the patient and family. After consideration of risks, benefits and other options for treatment, the patient has consented to  Procedure(s): LOWER EXTREMITY ANGIOGRAPHY (Left) as a surgical intervention.  The patient's history has been reviewed, patient examined, no change in status, stable for surgery.  I have reviewed the patient's chart and labs.  Questions were answered to the patient's satisfaction.     Hortencia Pilar

## 2020-05-07 ENCOUNTER — Telehealth (INDEPENDENT_AMBULATORY_CARE_PROVIDER_SITE_OTHER): Payer: Self-pay

## 2020-05-07 ENCOUNTER — Encounter: Payer: Self-pay | Admitting: Vascular Surgery

## 2020-05-07 NOTE — Telephone Encounter (Signed)
Patient has made aware with medical advice and verbalized understanding

## 2020-05-07 NOTE — Telephone Encounter (Signed)
Yes swelling with discomfort are expected.  This is due to irritation from stretching of the artery as well as restoring blood flow to the leg.  Some bruising is also expected.  Tylenol is helpful as well as elevation for the swelling.

## 2020-05-22 DIAGNOSIS — H02834 Dermatochalasis of left upper eyelid: Secondary | ICD-10-CM | POA: Diagnosis not present

## 2020-05-22 DIAGNOSIS — H02831 Dermatochalasis of right upper eyelid: Secondary | ICD-10-CM | POA: Diagnosis not present

## 2020-05-26 ENCOUNTER — Other Ambulatory Visit (INDEPENDENT_AMBULATORY_CARE_PROVIDER_SITE_OTHER): Payer: Self-pay | Admitting: Vascular Surgery

## 2020-05-26 DIAGNOSIS — Z95820 Peripheral vascular angioplasty status with implants and grafts: Secondary | ICD-10-CM

## 2020-05-28 ENCOUNTER — Ambulatory Visit (INDEPENDENT_AMBULATORY_CARE_PROVIDER_SITE_OTHER): Payer: PPO

## 2020-05-28 ENCOUNTER — Encounter (INDEPENDENT_AMBULATORY_CARE_PROVIDER_SITE_OTHER): Payer: Self-pay | Admitting: Nurse Practitioner

## 2020-05-28 ENCOUNTER — Ambulatory Visit (INDEPENDENT_AMBULATORY_CARE_PROVIDER_SITE_OTHER): Payer: PPO | Admitting: Nurse Practitioner

## 2020-05-28 ENCOUNTER — Other Ambulatory Visit: Payer: Self-pay

## 2020-05-28 VITALS — BP 123/61 | HR 74 | Resp 16 | Wt 178.2 lb

## 2020-05-28 DIAGNOSIS — I10 Essential (primary) hypertension: Secondary | ICD-10-CM

## 2020-05-28 DIAGNOSIS — Z95820 Peripheral vascular angioplasty status with implants and grafts: Secondary | ICD-10-CM

## 2020-05-28 DIAGNOSIS — E782 Mixed hyperlipidemia: Secondary | ICD-10-CM

## 2020-05-28 DIAGNOSIS — I70213 Atherosclerosis of native arteries of extremities with intermittent claudication, bilateral legs: Secondary | ICD-10-CM

## 2020-05-28 DIAGNOSIS — Z720 Tobacco use: Secondary | ICD-10-CM

## 2020-06-03 ENCOUNTER — Encounter (INDEPENDENT_AMBULATORY_CARE_PROVIDER_SITE_OTHER): Payer: Self-pay | Admitting: Nurse Practitioner

## 2020-06-03 NOTE — Progress Notes (Signed)
Subjective:    Patient ID: Ivan Reyes, male    DOB: 06-14-1946, 74 y.o.   MRN: [id number] Chief Complaint  Patient presents with  . Follow-up    [facility] 3wk post lle angio    The patient returns to the office for followup and review of the noninvasive studies. There have been no interval changes in lower extremity symptoms. No interval shortening of the patient's claudication distance or development of rest pain symptoms. No new ulcers or wounds have occurred since the last visit.  There have been no significant changes to the patient's overall health care.  The patient denies amaurosis fugax or recent TIA symptoms. There are no recent neurological changes noted. The patient denies history of DVT, PE or superficial thrombophlebitis. The patient denies recent episodes of angina or shortness of breath.   ABI Rt=1.02 and Lt=1.06  (previous ABI's Rt=1.17 and Lt=1.07) Duplex ultrasound of the right tibial arteries revealed biphasic waveforms in the tibial arteries with biphasic/monophasic waveforms in the left tibial arteries.  Duplex of the left lower extremity reveals a patent stent in the left SFA and popliteal area.  Patient has mostly triphasic and biphasic waveforms down to the level of the popliteal stent or becomes biphasic/monophasic.   Review of Systems  Cardiovascular: Positive for leg swelling.  All other systems reviewed and are negative.      Objective:   Physical Exam Vitals reviewed.  HENT:     Head: Normocephalic.  Cardiovascular:     Rate and Rhythm: Normal rate and regular rhythm.     Pulses: Normal pulses.  Pulmonary:     Effort: Pulmonary effort is normal.  Skin:    General: Skin is warm and dry.  Neurological:     Mental Status: He is alert and oriented to person, place, and time.  Psychiatric:        Mood and Affect: Mood normal.        Behavior: Behavior normal.        Thought Content: Thought content normal.        Judgment: Judgment normal.      BP 123/61 (BP Location: Right Arm)   Pulse 74   Resp 16   Wt 178 lb 3.2 oz (80.8 kg)   BMI 25.57 kg/m   Past Medical History:  Diagnosis Date  . Arthritis   . Coronary artery disease   . GERD (gastroesophageal reflux disease)   . Hyperlipidemia   . Hypertension   . Mitral valve disorder   . Peripheral vascular disease ([place])   . Sleep apnea    use C-PAP    Social History   Socioeconomic History  . Marital status: Widowed    Spouse name: Not on file  . Number of children: Not on file  . Years of education: Not on file  . Highest education level: Not on file  Occupational History  . Not on file  Tobacco Use  . Smoking status: Current Every Day Smoker    Packs/day: 0.50    Types: Cigarettes  . Smokeless tobacco: Never Used  Vaping Use  . Vaping Use: Never used  Substance and Sexual Activity  . Alcohol use: Yes    Alcohol/week: 2.0 standard drinks    Types: 2 Cans of beer per week    Comment: 4/ week  . Drug use: No  . Sexual activity: Not on file  Other Topics Concern  . Not on file  Social History Narrative  . Not on file  Social Determinants of Health   Financial Resource Strain:   . Difficulty of Paying Living Expenses: Not on file  Food Insecurity:   . Worried About Charity fundraiser in the Last Year: Not on file  . Ran Out of Food in the Last Year: Not on file  Transportation Needs:   . Lack of Transportation (Medical): Not on file  . Lack of Transportation (Non-Medical): Not on file  Physical Activity:   . Days of Exercise per Week: Not on file  . Minutes of Exercise per Session: Not on file  Stress:   . Feeling of Stress : Not on file  Social Connections:   . Frequency of Communication with Friends and Family: Not on file  . Frequency of Social Gatherings with Friends and Family: Not on file  . Attends Religious Services: Not on file  . Active Member of Clubs or Organizations: Not on file  . Attends Archivist Meetings: Not  on file  . Marital Status: Not on file  Intimate Partner Violence:   . Fear of Current or Ex-Partner: Not on file  . Emotionally Abused: Not on file  . Physically Abused: Not on file  . Sexually Abused: Not on file    Past Surgical History:  Procedure Laterality Date  . CAROTID ENDARTERECTOMY Bilateral    Dr. Delana Meyer, [facility]  . CARPAL TUNNEL RELEASE Left 12/23/2016   Procedure: CARPAL TUNNEL RELEASE ENDOSCOPIC;  Surgeon: Corky Mull, MD;  Location: [facility] ORS;  Service: Orthopedics;  Laterality: Left;  . CARPAL TUNNEL RELEASE Right 01/13/2017   Procedure: CARPAL TUNNEL RELEASE ENDOSCOPIC;  Surgeon: Corky Mull, MD;  Location: [facility] ORS;  Service: Orthopedics;  Laterality: Right;  . COLONOSCOPY WITH PROPOFOL N/A 09/04/2018   Procedure: COLONOSCOPY WITH PROPOFOL;  Surgeon: Manya Silvas, MD;  Location[facility] ENDOSCOPY;  Service: Endoscopy;  Laterality: N/A;  . CORONARY ANGIOPLASTY     3 stents  . coronary atherosclerosis of  autologous graft    . ESOPHAGOGASTRODUODENOSCOPY (EGD) WITH PROPOFOL N/A 12/12/2017   Procedure: ESOPHAGOGASTRODUODENOSCOPY (EGD) WITH PROPOFOL;  Surgeon: Manya Silvas, MD;  Location: [facility] ENDOSCOPY;  Service: Endoscopy;  Laterality: N/A;  . ESOPHAGOGASTRODUODENOSCOPY (EGD) WITH PROPOFOL N/A 09/04/2018   Procedure: ESOPHAGOGASTRODUODENOSCOPY (EGD) WITH PROPOFOL;  Surgeon: Manya Silvas, MD;  Location: [facility];  Service: Endoscopy;  Laterality: N/A;  . EYE SURGERY Bilateral    Cataract Extraction with IOL  . FRACTURE SURGERY Right    Hip Pinning, Dr. Franchot Mimes, Jr  . JOINT REPLACEMENT     left knee  . KNEE ARTHROPLASTY Left 06/30/2016   Procedure: COMPUTER ASSISTED TOTAL KNEE ARTHROPLASTY;  Surgeon: Dereck Leep, MD;  Location: [facility] ORS;  Service: Orthopedics;  Laterality: Left;  . LOWER EXTREMITY ANGIOGRAPHY Left 05/06/2020   Procedure: LOWER EXTREMITY ANGIOGRAPHY;  Surgeon: Katha Cabal, MD;  Location: [facility];  Service:  Cardiovascular;  Laterality: Left;  Marland Kitchen MASS EXCISION Left 01/11/2018   Procedure: EXCISION CYSTIC MASS ANTERIOR KNEE;  Surgeon: Dereck Leep, MD;  Location: [facility] ORS;  Service: Orthopedics;  Laterality: Left;    History reviewed. No pertinent family history.  No Known Allergies     Assessment & Plan:   1. Atherosclerosis of native artery of both lower extremities with intermittent claudication (HCC)  Recommend:  The patient has evidence of atherosclerosis of the lower extremities with claudication.  The patient does not voice lifestyle limiting changes at this point in time.  Noninvasive studies  do not suggest clinically significant change.  No invasive studies, angiography or surgery at this time The patient should continue walking and begin a more formal exercise program.  The patient should continue antiplatelet therapy and aggressive treatment of the lipid abnormalities  No changes in the patient's medications at this time  The patient should continue wearing graduated compression socks 10-15 mmHg strength to control the mild edema.    2. Primary hypertension Continue antihypertensive medications as already ordered, these medications have been reviewed and there are no changes at this time.   3. Tobacco use Smoking cessation was discussed, 3-10 minutes spent on this topic specifically   4. Mixed hyperlipidemia Continue statin as ordered and reviewed, no changes at this time    Current Outpatient Medications on File Prior to Visit  Medication Sig Dispense Refill  . Cholecalciferol (VITAMIN D) 2000 units CAPS Take 2,000 Units by mouth every evening.    . clopidogrel (PLAVIX) 75 MG tablet Take 75 mg by mouth daily.    . enalapril (VASOTEC) 20 MG tablet Take 20 mg by mouth 2 (two) times daily.    . fenofibrate micronized (LOFIBRA) 134 MG capsule Take 134 mg by mouth daily before breakfast.     . Glucosamine HCl (GLUCOSAMINE PO) Take 1 tablet by mouth 2 (two) times  daily.    . Multiple Vitamins-Minerals (PX COMPLETE SENIOR MULTIVITS) TABS Take 1 tablet by mouth.    . Omega-3 Fatty Acids (OMEGA-3 FISH OIL) 300 MG CAPS Take 300 mg by mouth 2 (two) times daily.    . pantoprazole (PROTONIX) 20 MG tablet Take 20 mg by mouth every morning.    . sildenafil (REVATIO) 20 MG tablet Take 60-100 mg by mouth daily as needed (for erectile dysfunction). Pt states 2 morning and night    . simvastatin (ZOCOR) 40 MG tablet Take 40 mg by mouth daily at 6 PM.     . vitamin B-12 (CYANOCOBALAMIN) 1000 MCG tablet Take 1,000 mcg by mouth daily.    . vitamin E 400 UNIT capsule Take 400 Units by mouth every evening.     . tamsulosin (FLOMAX) 0.4 MG CAPS capsule Take 0.4 mg by mouth daily. (Patient not taking: Reported on 03/27/2020)     No current facility-administered medications on file prior to visit.    There are no Patient Instructions on file for this visit. No follow-ups on file.   Kris Hartmann, NP

## 2020-06-18 DIAGNOSIS — I10 Essential (primary) hypertension: Secondary | ICD-10-CM | POA: Diagnosis not present

## 2020-07-09 NOTE — Progress Notes (Deleted)
[place]  [phone]) [phone] Fax:(336) [phone]  ID: [name] OB: 1946/07/16  MR#: [id number]  WUX#:[id number]  Patient Care Team: Baxter Hire, MD as PCP - General (Internal Medicine) Lloyd Huger, MD as Consulting Physician (Hematology and Oncology)  CHIEF COMPLAINT: Iron deficiency anemia.  INTERVAL HISTORY: Patient returns to clinic today for repeat laboratory work, further evaluation, and continuation of IV Feraheme.  He felt initially improved after receiving IV iron several months ago, but still has persistent chronic weakness and fatigue.  He otherwise feels well.  He has no neurologic complaints.  He denies any recent fevers or illnesses.  He has a good appetite and denies weight loss.  He denies any chest pain, shortness of breath, cough, or hemoptysis.  He denies any nausea, vomiting, constipation, or diarrhea.  He has no melena or hematochezia.  He has no urinary complaints.  Patient offers no further specific complaints today.  REVIEW OF SYSTEMS:   Review of Systems  Constitutional: Positive for malaise/fatigue. Negative for fever and weight loss.  Respiratory: Negative.  Negative for cough and shortness of breath.   Cardiovascular: Negative.  Negative for chest pain and leg swelling.  Gastrointestinal: Negative.  Negative for abdominal pain, blood in stool and melena.  Genitourinary: Negative.  Negative for dysuria and hematuria.  Musculoskeletal: Negative.  Negative for back pain.  Skin: Negative.  Negative for rash.  Neurological: Positive for weakness. Negative for dizziness, focal weakness and headaches.  Psychiatric/Behavioral: Negative.  The patient is not nervous/anxious.     As per HPI. Otherwise, a complete review of systems is negative.  PAST MEDICAL HISTORY: Past Medical History:  Diagnosis Date  . Arthritis   . Coronary artery disease   . GERD (gastroesophageal reflux disease)   . Hyperlipidemia   .  Hypertension   . Mitral valve disorder   . Peripheral vascular disease ([place])   . Sleep apnea    use C-PAP    PAST SURGICAL HISTORY: Past Surgical History:  Procedure Laterality Date  . CAROTID ENDARTERECTOMY Bilateral    Dr. Delana Meyer, [facility]  . CARPAL TUNNEL RELEASE Left 12/23/2016   Procedure: CARPAL TUNNEL RELEASE ENDOSCOPIC;  Surgeon: Corky Mull, MD;  Location: [facility] ORS;  Service: Orthopedics;  Laterality: Left;  . CARPAL TUNNEL RELEASE Right 01/13/2017   Procedure: CARPAL TUNNEL RELEASE ENDOSCOPIC;  Surgeon: Corky Mull, MD;  Location: [facility] ORS;  Service: Orthopedics;  Laterality: Right;  . COLONOSCOPY WITH PROPOFOL N/A 09/04/2018   Procedure: COLONOSCOPY WITH PROPOFOL;  Surgeon: Manya Silvas, MD;  Location: [facility] ENDOSCOPY;  Service: Endoscopy;  Laterality: N/A;  . CORONARY ANGIOPLASTY     3 stents  . coronary atherosclerosis of  autologous graft    . ESOPHAGOGASTRODUODENOSCOPY (EGD) WITH PROPOFOL N/A 12/12/2017   Procedure: ESOPHAGOGASTRODUODENOSCOPY (EGD) WITH PROPOFOL;  Surgeon: Manya Silvas, MD;  Location: [facility] ENDOSCOPY;  Service: Endoscopy;  Laterality: N/A;  . ESOPHAGOGASTRODUODENOSCOPY (EGD) WITH PROPOFOL N/A 09/04/2018   Procedure: ESOPHAGOGASTRODUODENOSCOPY (EGD) WITH PROPOFOL;  Surgeon: Manya Silvas, MD;  Location: [facility] ENDOSCOPY;  Service: Endoscopy;  Laterality: N/A;  . EYE SURGERY Bilateral    Cataract Extraction with IOL  . FRACTURE SURGERY Right    Hip Pinning, Dr. Franchot Mimes, Jr  . JOINT REPLACEMENT     left knee  . KNEE ARTHROPLASTY Left 06/30/2016   Procedure: COMPUTER ASSISTED TOTAL KNEE ARTHROPLASTY;  Surgeon: Dereck Leep, MD;  Location: [facility] ORS;  Service: Orthopedics;  Laterality: Left;  . LOWER  EXTREMITY ANGIOGRAPHY Left 05/06/2020   Procedure: LOWER EXTREMITY ANGIOGRAPHY;  Surgeon: Katha Cabal, MD;  Location: [place] CV LAB;  Service: Cardiovascular;  Laterality: Left;  Marland Kitchen MASS EXCISION Left 01/11/2018   Procedure: EXCISION  CYSTIC MASS ANTERIOR KNEE;  Surgeon: Dereck Leep, MD;  Location: [facility] ORS;  Service: Orthopedics;  Laterality: Left;    FAMILY HISTORY: No family history on file.  ADVANCED DIRECTIVES (Y/N):  N  HEALTH MAINTENANCE: Social History   Tobacco Use  . Smoking status: Current Every Day Smoker    Packs/day: 0.50    Types: Cigarettes  . Smokeless tobacco: Never Used  Vaping Use  . Vaping Use: Never used  Substance Use Topics  . Alcohol use: Yes    Alcohol/week: 2.0 standard drinks    Types: 2 Cans of beer per week    Comment: 4/ week  . Drug use: No     Colonoscopy:  PAP:  Bone density:  Lipid panel:  No Known Allergies  Current Outpatient Medications  Medication Sig Dispense Refill  . Cholecalciferol (VITAMIN D) 2000 units CAPS Take 2,000 Units by mouth every evening.    . clopidogrel (PLAVIX) 75 MG tablet Take 75 mg by mouth daily.    . enalapril (VASOTEC) 20 MG tablet Take 20 mg by mouth 2 (two) times daily.    . fenofibrate micronized (LOFIBRA) 134 MG capsule Take 134 mg by mouth daily before breakfast.     . Glucosamine HCl (GLUCOSAMINE PO) Take 1 tablet by mouth 2 (two) times daily.    . Multiple Vitamins-Minerals (PX COMPLETE SENIOR MULTIVITS) TABS Take 1 tablet by mouth.    . Omega-3 Fatty Acids (OMEGA-3 FISH OIL) 300 MG CAPS Take 300 mg by mouth 2 (two) times daily.    . pantoprazole (PROTONIX) 20 MG tablet Take 20 mg by mouth every morning.    . sildenafil (REVATIO) 20 MG tablet Take 60-100 mg by mouth daily as needed (for erectile dysfunction). Pt states 2 morning and night    . simvastatin (ZOCOR) 40 MG tablet Take 40 mg by mouth daily at 6 PM.     . tamsulosin (FLOMAX) 0.4 MG CAPS capsule Take 0.4 mg by mouth daily. (Patient not taking: Reported on 03/27/2020)    . vitamin B-12 (CYANOCOBALAMIN) 1000 MCG tablet Take 1,000 mcg by mouth daily.    . vitamin E 400 UNIT capsule Take 400 Units by mouth every evening.      No current facility-administered medications  for this visit.    OBJECTIVE: There were no vitals filed for this visit.   There is no height or weight on file to calculate BMI.    ECOG FS:0 - Asymptomatic  General: Well-developed, well-nourished, no acute distress. Eyes: Pink conjunctiva, anicteric sclera. HEENT: Normocephalic, moist mucous membranes. Lungs: No audible wheezing or coughing. Heart: Regular rate and rhythm. Abdomen: Soft, nontender, no obvious distention. Musculoskeletal: No edema, cyanosis, or clubbing. Neuro: Alert, answering all questions appropriately. Cranial nerves grossly intact. Skin: No rashes or petechiae noted. Psych: Normal affect.   LAB RESULTS:  Lab Results  Component Value Date   NA 137 12/30/2017   K 4.0 12/30/2017   CL 105 12/30/2017   CO2 24 12/30/2017   GLUCOSE 89 12/30/2017   BUN 13 05/06/2020   CREATININE 1.00 05/06/2020   CALCIUM 9.4 12/30/2017   PROT 7.5 06/16/2016   ALBUMIN 4.4 06/16/2016   AST 26 06/16/2016   ALT 16 (L) 06/16/2016   ALKPHOS 39 06/16/2016  BILITOT 0.6 06/16/2016   GFRNONAA >60 05/06/2020   GFRAA >60 05/06/2020    Lab Results  Component Value Date   WBC 7.2 04/14/2020   NEUTROABS 4.9 04/14/2020   HGB 13.4 04/14/2020   HCT 38.4 (L) 04/14/2020   MCV 88.9 04/14/2020   PLT 195 04/14/2020   Lab Results  Component Value Date   IRON 71 04/14/2020   TIBC 357 04/14/2020   IRONPCTSAT 20 04/14/2020   Lab Results  Component Value Date   FERRITIN 30 04/14/2020     STUDIES: No results found.  ASSESSMENT: Iron deficiency anemia.  PLAN:   1.  Iron deficiency anemia: Patient's hemoglobin has significantly improved and is now essentially within normal limits at 12.6.  His iron stores remain decreased, therefore will pursue an additional 510 mg IV Feraheme today.  Colonoscopy and upper endoscopy on September 04, 2018 did not reveal any significant pathology.  Return to clinic in 3 months with repeat laboratory work, further evaluation, and continuation of  treatment if needed.  I spent a total of 30 minutes reviewing chart data, face-to-face evaluation with the patient, counseling and coordination of care as detailed above.   Patient expressed understanding and was in agreement with this plan. He also understands that He can call clinic at any time with any questions, concerns, or complaints.    Lloyd Huger, MD   07/09/2020 12:21 PM

## 2020-07-13 DIAGNOSIS — Z20822 Contact with and (suspected) exposure to covid-19: Secondary | ICD-10-CM | POA: Diagnosis not present

## 2020-07-13 DIAGNOSIS — R059 Cough, unspecified: Secondary | ICD-10-CM | POA: Diagnosis not present

## 2020-07-13 DIAGNOSIS — Z03818 Encounter for observation for suspected exposure to other biological agents ruled out: Secondary | ICD-10-CM | POA: Diagnosis not present

## 2020-07-13 DIAGNOSIS — J019 Acute sinusitis, unspecified: Secondary | ICD-10-CM | POA: Diagnosis not present

## 2020-07-16 ENCOUNTER — Inpatient Hospital Stay: Payer: PPO | Attending: Oncology

## 2020-07-16 ENCOUNTER — Other Ambulatory Visit: Payer: Self-pay

## 2020-07-16 DIAGNOSIS — D509 Iron deficiency anemia, unspecified: Secondary | ICD-10-CM | POA: Diagnosis not present

## 2020-07-16 LAB — CBC WITH DIFFERENTIAL/PLATELET
Abs Immature Granulocytes: 0.08 10*3/uL — ABNORMAL HIGH (ref 0.00–0.07)
Basophils Absolute: 0.1 10*3/uL (ref 0.0–0.1)
Basophils Relative: 1 %
Eosinophils Absolute: 0.2 10*3/uL (ref 0.0–0.5)
Eosinophils Relative: 3 %
HCT: 25.2 % — ABNORMAL LOW (ref 39.0–52.0)
Hemoglobin: 7.7 g/dL — ABNORMAL LOW (ref 13.0–17.0)
Immature Granulocytes: 1 %
Lymphocytes Relative: 22 %
Lymphs Abs: 1.7 10*3/uL (ref 0.7–4.0)
MCH: 24.1 pg — ABNORMAL LOW (ref 26.0–34.0)
MCHC: 30.6 g/dL (ref 30.0–36.0)
MCV: 78.8 fL — ABNORMAL LOW (ref 80.0–100.0)
Monocytes Absolute: 0.7 10*3/uL (ref 0.1–1.0)
Monocytes Relative: 10 %
Neutro Abs: 4.7 10*3/uL (ref 1.7–7.7)
Neutrophils Relative %: 63 %
Platelets: 309 10*3/uL (ref 150–400)
RBC: 3.2 MIL/uL — ABNORMAL LOW (ref 4.22–5.81)
RDW: 18.5 % — ABNORMAL HIGH (ref 11.5–15.5)
WBC: 7.5 10*3/uL (ref 4.0–10.5)
nRBC: 0 % (ref 0.0–0.2)

## 2020-07-16 LAB — IRON AND TIBC
Iron: 13 ug/dL — ABNORMAL LOW (ref 45–182)
Saturation Ratios: 3 % — ABNORMAL LOW (ref 17.9–39.5)
TIBC: 456 ug/dL — ABNORMAL HIGH (ref 250–450)
UIBC: 443 ug/dL

## 2020-07-16 LAB — FERRITIN: Ferritin: 5 ng/mL — ABNORMAL LOW (ref 24–336)

## 2020-07-17 ENCOUNTER — Inpatient Hospital Stay: Payer: PPO | Admitting: Oncology

## 2020-07-17 ENCOUNTER — Inpatient Hospital Stay: Payer: PPO

## 2020-07-17 DIAGNOSIS — D509 Iron deficiency anemia, unspecified: Secondary | ICD-10-CM

## 2020-07-17 DIAGNOSIS — I10 Essential (primary) hypertension: Secondary | ICD-10-CM | POA: Diagnosis not present

## 2020-07-21 ENCOUNTER — Other Ambulatory Visit: Payer: Self-pay

## 2020-07-21 ENCOUNTER — Encounter: Payer: Self-pay | Admitting: Ophthalmology

## 2020-07-22 ENCOUNTER — Other Ambulatory Visit
Admission: RE | Admit: 2020-07-22 | Discharge: 2020-07-22 | Disposition: A | Discharge status: Home / Self Care | Payer: PPO | Source: Ambulatory Visit | Attending: Ophthalmology | Admitting: Ophthalmology

## 2020-07-22 DIAGNOSIS — Z01812 Encounter for preprocedural laboratory examination: Secondary | ICD-10-CM | POA: Diagnosis not present

## 2020-07-22 DIAGNOSIS — Z20822 Contact with and (suspected) exposure to covid-19: Secondary | ICD-10-CM | POA: Diagnosis not present

## 2020-07-22 LAB — SARS CORONAVIRUS 2 (TAT 6-24 HRS): SARS Coronavirus 2: NEGATIVE

## 2020-07-22 NOTE — Discharge Instructions (Signed)
INSTRUCTIONS FOLLOWING OCULOPLASTIC SURGERY AMY M. FOWLER, MD  AFTER YOUR EYE SURGERY, THER ARE MANY THINGS WHICH YOU, THE PATIENT, CAN DO TO ASSURE THE BEST POSSIBLE RESULT FROM YOUR OPERATION.  THIS SHEET SHOULD BE REFERRED TO WHENEVER QUESTIONS ARISE.  IF THERE ARE ANY QUESTIONS NOT ANSWERED HERE, DO NOT HESITATE TO CALL OUR OFFICE AT [phone] OR [phone].  THERE IS ALWAYS SOMEONE AVAILABLE TO CALL IF QUESTIONS OR PROBLEMS ARISE.  VISION: Your vision may be blurred and out of focus after surgery until you are able to stop using your ointment, swelling resolves and your eye(s) heal. This may take 1 to 2 weeks at the least.  If your vision becomes gradually more dim or dark, this is not normal and you need to call our office immediately.  EYE CARE: For the first 48 hours after surgery, use ice packs frequently - "20 minutes on, 20 minutes off" - to help reduce swelling and bruising.  Small bags of frozen peas or corn make good ice packs along with cloths soaked in ice water.  If you are wearing a patch or other type of dressing following surgery, keep this on for the amount of time specified by your doctor.  For the first week following surgery, you will need to treat your stitches with great care.  It is OK to shower, but take care to not allow soapy water to run into your eye(s) to help reduce chances of infection.  You may gently clean the eyelashes and around the eye(s) with cotton balls and sterile water, BUT DO NOT RUB THE STITCHES VIGOROUSLY.  Keeping your stitches moist with ointment will help promote healing with minimal scar formation.  ACTIVITY: When you leave the surgery center, you should go home, rest and be inactive.  The eye(s) may feel scratchy and keeping the eyes closed will allow for faster healing.  The first week following surgery, avoid straining (anything making the face turn red) or lifting over 20 pounds.  Additionally, avoid bending which causes your head to go below  your waist.  Using your eyes will NOT harm them, so feel free to read, watch television, use the computer, etc as desired.  Driving depends on each individual, so check with your doctor if you have questions about driving. Do not wear contact lenses for about 2 weeks.  Do not wear eye makeup for 2 weeks.  Avoid swimming, hot tubs, gardening, and dusting for 1 to 2 weeks to reduce the risk of an infection.  MEDICATIONS:  You will be given a prescription for an ointment to use 4 times a day on your stitches.  You can use the ointment in your eyes if they feel scratchy or irritated.  If you eyelid(s) don't close completely when you sleep, put some ointment in your eyes before bedtime.  EMERGENCY: If you experience SEVERE EYE PAIN OR HEADACHE UNRELIEVED BY TYLENOL OR TRAMADOL, NAUSEA OR VOMITING, WORSENING REDNESS, OR WORSENING VISION (ESPECIALLY VISION THAT WAS INITIALLY BETTER) CALL [phone] OR [phone] DURING BUSINESS HOURS OR AFTER HOURS.  General Anesthesia, Adult, Care After This sheet gives you information about how to care for yourself after your procedure. Your health care provider may also give you more specific instructions. If you have problems or questions, contact your health care provider. What can I expect after the procedure? After the procedure, the following side effects are common:  Pain or discomfort at the IV site.  Nausea.  Vomiting.  Sore throat.  Trouble concentrating.    Feeling cold or chills.  Weak or tired.  Sleepiness and fatigue.  Soreness and body aches. These side effects can affect parts of the body that were not involved in surgery. Follow these instructions at home:  For at least 24 hours after the procedure:  Have a responsible adult stay with you. It is important to have someone help care for you until you are awake and alert.  Rest as needed.  Do not: ? Participate in activities in which you could fall or become injured. ? Drive. ? Use  heavy machinery. ? Drink alcohol. ? Take sleeping pills or medicines that cause drowsiness. ? Make important decisions or sign legal documents. ? Take care of children on your own. Eating and drinking  Follow any instructions from your health care provider about eating or drinking restrictions.  When you feel hungry, start by eating small amounts of foods that are soft and easy to digest (bland), such as toast. Gradually return to your regular diet.  Drink enough fluid to keep your urine pale yellow.  If you vomit, rehydrate by drinking water, juice, or clear broth. General instructions  If you have sleep apnea, surgery and certain medicines can increase your risk for breathing problems. Follow instructions from your health care provider about wearing your sleep device: ? Anytime you are sleeping, including during daytime naps. ? While taking prescription pain medicines, sleeping medicines, or medicines that make you drowsy.  Return to your normal activities as told by your health care provider. Ask your health care provider what activities are safe for you.  Take over-the-counter and prescription medicines only as told by your health care provider.  If you smoke, do not smoke without supervision.  Keep all follow-up visits as told by your health care provider. This is important. Contact a health care provider if:  You have nausea or vomiting that does not get better with medicine.  You cannot eat or drink without vomiting.  You have pain that does not get better with medicine.  You are unable to pass urine.  You develop a skin rash.  You have a fever.  You have redness around your IV site that gets worse. Get help right away if:  You have difficulty breathing.  You have chest pain.  You have blood in your urine or stool, or you vomit blood. Summary  After the procedure, it is common to have a sore throat or nausea. It is also common to feel tired.  Have a  responsible adult stay with you for the first 24 hours after general anesthesia. It is important to have someone help care for you until you are awake and alert.  When you feel hungry, start by eating small amounts of foods that are soft and easy to digest (bland), such as toast. Gradually return to your regular diet.  Drink enough fluid to keep your urine pale yellow.  Return to your normal activities as told by your health care provider. Ask your health care provider what activities are safe for you. This information is not intended to replace advice given to you by your health care provider. Make sure you discuss any questions you have with your health care provider. Document Revised: 08/05/2017 Document Reviewed: 03/18/2017 Elsevier Patient Education  2020 [employer] Inc[employer]  

## 2020-07-24 ENCOUNTER — Other Ambulatory Visit: Payer: Self-pay

## 2020-07-24 ENCOUNTER — Ambulatory Visit: Payer: PPO | Admitting: Anesthesiology

## 2020-07-24 ENCOUNTER — Ambulatory Visit
Admission: RE | Admit: 2020-07-24 | Discharge: 2020-07-24 | Disposition: A | Discharge status: Home / Self Care | Payer: PPO | Attending: Ophthalmology | Admitting: Ophthalmology

## 2020-07-24 ENCOUNTER — Encounter
Admission: RE | Disposition: A | Discharge status: Home / Self Care | Payer: Self-pay | Source: Home / Self Care | Attending: Ophthalmology

## 2020-07-24 ENCOUNTER — Encounter: Payer: Self-pay | Admitting: Ophthalmology

## 2020-07-24 DIAGNOSIS — Z955 Presence of coronary angioplasty implant and graft: Secondary | ICD-10-CM | POA: Diagnosis not present

## 2020-07-24 DIAGNOSIS — Z95820 Peripheral vascular angioplasty status with implants and grafts: Secondary | ICD-10-CM | POA: Diagnosis not present

## 2020-07-24 DIAGNOSIS — F17200 Nicotine dependence, unspecified, uncomplicated: Secondary | ICD-10-CM | POA: Diagnosis not present

## 2020-07-24 DIAGNOSIS — Z7902 Long term (current) use of antithrombotics/antiplatelets: Secondary | ICD-10-CM | POA: Insufficient documentation

## 2020-07-24 DIAGNOSIS — Z79899 Other long term (current) drug therapy: Secondary | ICD-10-CM | POA: Insufficient documentation

## 2020-07-24 DIAGNOSIS — Z86718 Personal history of other venous thrombosis and embolism: Secondary | ICD-10-CM | POA: Diagnosis not present

## 2020-07-24 DIAGNOSIS — H02831 Dermatochalasis of right upper eyelid: Secondary | ICD-10-CM | POA: Insufficient documentation

## 2020-07-24 DIAGNOSIS — H02834 Dermatochalasis of left upper eyelid: Secondary | ICD-10-CM | POA: Diagnosis not present

## 2020-07-24 HISTORY — PX: BROW LIFT: SHX178

## 2020-07-24 SURGERY — BLEPHAROPLASTY
Anesthesia: Monitor Anesthesia Care | Laterality: Bilateral

## 2020-07-24 MED ORDER — TRAMADOL HCL 50 MG PO TABS
ORAL_TABLET | ORAL | 0 refills | Status: DC
Start: 2020-07-24 — End: 2020-09-11

## 2020-07-24 MED ORDER — ACETAMINOPHEN 160 MG/5ML PO SOLN: 325.0000 mg | Freq: Once | ORAL | Status: DC

## 2020-07-24 MED ORDER — ACETAMINOPHEN 325 MG PO TABS: 325.0000 mg | ORAL_TABLET | Freq: Once | ORAL | Status: DC

## 2020-07-24 MED ORDER — ERYTHROMYCIN 5 MG/GM OP OINT: TOPICAL_OINTMENT | OPHTHALMIC | 2 refills | Status: DC

## 2020-07-24 MED ORDER — PROPOFOL 500 MG/50ML IV EMUL
INTRAVENOUS | Status: DC | PRN
  Administered 2020-07-24: 25 ug/kg/min via INTRAVENOUS

## 2020-07-24 MED ORDER — ALFENTANIL 500 MCG/ML IJ INJ
INJECTION | INTRAVENOUS | Status: DC | PRN
  Administered 2020-07-24: 300 ug via INTRAVENOUS
  Administered 2020-07-24: 500 ug via INTRAVENOUS

## 2020-07-24 MED ORDER — TETRACAINE HCL 0.5 % OP SOLN
OPHTHALMIC | Status: DC | PRN
  Administered 2020-07-24: 1 [drp] via OPHTHALMIC

## 2020-07-24 MED ORDER — MIDAZOLAM HCL 2 MG/2ML IJ SOLN
INTRAMUSCULAR | Status: DC | PRN
  Administered 2020-07-24: 1 mg via INTRAVENOUS

## 2020-07-24 MED ORDER — ERYTHROMYCIN 5 MG/GM OP OINT
TOPICAL_OINTMENT | OPHTHALMIC | Status: DC | PRN
  Administered 2020-07-24: 1 via OPHTHALMIC

## 2020-07-24 MED ORDER — LACTATED RINGERS IV SOLN: INTRAVENOUS | Status: DC

## 2020-07-24 MED ORDER — LIDOCAINE HCL (CARDIAC) PF 100 MG/5ML IV SOSY
PREFILLED_SYRINGE | INTRAVENOUS | Status: DC | PRN
  Administered 2020-07-24: 50 mg via INTRAVENOUS

## 2020-07-24 MED ORDER — LIDOCAINE-EPINEPHRINE 2 %-1:100000 IJ SOLN
INTRAMUSCULAR | Status: DC | PRN
  Administered 2020-07-24: 2 mL via OPHTHALMIC

## 2020-07-24 SURGICAL SUPPLY — 37 items
APPLICATOR COTTON TIP WD 3 STR (MISCELLANEOUS) ×3 IMPLANT
BLADE SURG 15 STRL LF DISP TIS (BLADE) ×1 IMPLANT
BLADE SURG 15 STRL SS (BLADE) ×3
CORD BIP STRL DISP 12FT (MISCELLANEOUS) ×3 IMPLANT
DRAPE HEAD BAR (DRAPES) ×3 IMPLANT
GAUZE SPONGE 4X4 12PLY STRL (GAUZE/BANDAGES/DRESSINGS) ×3 IMPLANT
GLOVE SURG LX 7.0 MICRO (GLOVE) ×4
GLOVE SURG LX STRL 7.0 MICRO (GLOVE) ×2 IMPLANT
GOWN STRL REUS W/ TWL LRG LVL3 (GOWN DISPOSABLE) ×1 IMPLANT
GOWN STRL REUS W/TWL LRG LVL3 (GOWN DISPOSABLE) ×3
MARKER SKIN XFINE TIP W/RULER (MISCELLANEOUS) ×3 IMPLANT
NEEDLE FILTER BLUNT 18X 1/2SAF (NEEDLE) ×2
NEEDLE FILTER BLUNT 18X1 1/2 (NEEDLE) ×1 IMPLANT
NEEDLE HYPO 30X.5 LL (NEEDLE) ×6 IMPLANT
PACK ENT CUSTOM (PACKS) ×3 IMPLANT
SOL PREP PVP 2OZ (MISCELLANEOUS) ×3
SOLUTION PREP PVP 2OZ (MISCELLANEOUS) ×1 IMPLANT
SPONGE GAUZE 2X2 8PLY STER LF (GAUZE/BANDAGES/DRESSINGS) ×10
SPONGE GAUZE 2X2 8PLY STRL LF (GAUZE/BANDAGES/DRESSINGS) ×20 IMPLANT
SUT CHROMIC 4-0 (SUTURE)
SUT CHROMIC 4-0 M2 12X2 ARM (SUTURE)
SUT CHROMIC 5 0 P 3 (SUTURE) IMPLANT
SUT ETHILON 4 0 CL P 3 (SUTURE) IMPLANT
SUT GUT PLAIN 6-0 1X18 ABS (SUTURE) ×3 IMPLANT
SUT MERSILENE 4-0 S-2 (SUTURE) IMPLANT
SUT PROLENE 5 0 P 3 (SUTURE) IMPLANT
SUT PROLENE 6 0 P 1 18 (SUTURE) IMPLANT
SUT SILK 4 0 G 3 (SUTURE) IMPLANT
SUT VIC AB 5-0 P-3 18X BRD (SUTURE) IMPLANT
SUT VIC AB 5-0 P3 18 (SUTURE)
SUT VICRYL 6-0  S14 CTD (SUTURE)
SUT VICRYL 6-0 S14 CTD (SUTURE) IMPLANT
SUT VICRYL 7 0 TG140 8 (SUTURE) IMPLANT
SUTURE CHRMC 4-0 M2 12X2 ARM (SUTURE) IMPLANT
SYR 10ML LL (SYRINGE) ×3 IMPLANT
SYR 3ML LL SCALE MARK (SYRINGE) ×3 IMPLANT
WATER STERILE IRR 250ML POUR (IV SOLUTION) ×3 IMPLANT

## 2020-07-24 NOTE — Op Note (Signed)
Preoperative Diagnosis:  Visually significant dermatochalasis bilateral  Upper Eyelid(s)  Postoperative Diagnosis:  Same.  Procedure(s) Performed:   Upper eyelid blepharoplasty with excess skin excision  bilateral  Upper Eyelid(s)  Surgeon: Philis Pique. Vickki Muff, M.D.  Assistants: none  Anesthesia: MAC  Specimens: None.  Estimated Blood Loss: Minimal.  Complications: None.  Operative Findings: None Dictated  Procedure:   Allergies were reviewed and the patient is allergic to Patient has no known allergies..   After the risks, benefits, complications and alternatives were discussed with the patient, appropriate informed consent was obtained and the patient was brought to the operating suite. The patient was reclined supine and a timeout was conducted.  The patient was then sedated.  Local anesthetic consisting of a 50-50 mixture of 2% lidocaine with epinephrine and 0.75% bupivacaine with added Hylenex was injected subcutaneously to both  upper eyelid(s). After adequate local was instilled, the patient was prepped and draped in the usual sterile fashion for eyelid surgery.   Attention was turned to the upper eyelids. A 57m upper eyelid crease incision line was marked with calipers on both  upper eyelid(s).  A pinch test was used to estimate the amount of excess skin to remove and this was marked in standard blepharoplasty style fashion. Attention was turned to the  right  upper eyelid. A #15 blade was used to open the premarked incision line. A Skin and muscle flap was excised and hemostasis was obtained with bipolar cautery.   Attention was then turned to the opposite eyelid where the same procedure was performed in the same manner. Hemostasis was obtained with bipolar cautery throughout. All incisions were then closed with a combination of running and interrupted 6-0 fast absorbing plain suture. The patient tolerated the procedure well.  Erythromycin ophthalmic ointment was applied to his  incision sites, followed by ice packs. He was taken to the recovery area where he recovered without difficulty.  Post-Op Plan/Instructions:  The patient was instructed to use ice packs frequently for the next 48 hours. He was instructed to use Erythromycin ophthalmic ointment on his incisions 4 times a day for the next 12 to 14 days. He was given a prescription for tramadol (or similar) for pain control should Tylenol not be effective. [facility] [place].  Zacharias Ridling M. FVickki Muff M.D. Ophthalmology

## 2020-07-24 NOTE — Interval H&P Note (Signed)
History and Physical Interval Note:  07/24/2020 10:50 AM  Ivan Reyes  has presented today for surgery, with the diagnosis of H02.831 Dermatochalasis of Right Upper Eyelid H02.834 Dermatochalasis of Left Upper Eyelid.  The various methods of treatment have been discussed with the patient and family. After consideration of risks, benefits and other options for treatment, the patient has consented to  Procedure(s) with comments: BLEPHAROPLASTY UPPER EYELID; W/EXCESS SKIN BILATERAL (Bilateral) - sleep apnea as a surgical intervention.  The patient's history has been reviewed, patient examined, no change in status, stable for surgery.  I have reviewed the patient's chart and labs.  Questions were answered to the patient's satisfaction.     Vickki Muff, Jenilee Franey M

## 2020-07-24 NOTE — H&P (Signed)
  See the history and physical completed at [facility] on 07/17/2020 and scanned into the chart.

## 2020-07-24 NOTE — Anesthesia Procedure Notes (Signed)
Performed by: Callie Bunyard, CRNA Pre-anesthesia Checklist: Patient identified, Emergency Drugs available, Suction available, Timeout performed and Patient being monitored Patient Re-evaluated:Patient Re-evaluated prior to induction Oxygen Delivery Method: Nasal cannula Placement Confirmation: positive ETCO2       

## 2020-07-24 NOTE — Anesthesia Preprocedure Evaluation (Signed)
Anesthesia Evaluation  Patient identified by MRN, date of birth, ID band Patient awake    Reviewed: Allergy & Precautions, H&P , NPO status , Patient's Chart, lab work & pertinent test results  Airway Mallampati: II  TM Distance: >3 FB Neck ROM: full    Dental no notable dental hx.    Pulmonary sleep apnea , Current Smoker and Patient abstained from smoking.,    Pulmonary exam normal breath sounds clear to auscultation       Cardiovascular hypertension, + CAD and + Peripheral Vascular Disease  Normal cardiovascular exam Rhythm:regular Rate:Normal     Neuro/Psych  Neuromuscular disease    GI/Hepatic GERD  ,  Endo/Other    Renal/GU      Musculoskeletal   Abdominal   Peds  Hematology   Anesthesia Other Findings   Reproductive/Obstetrics                             Anesthesia Physical Anesthesia Plan  ASA: III  Anesthesia Plan: MAC   Post-op Pain Management:    Induction:   PONV Risk Score and Plan: 1 and Treatment may vary due to age or medical condition, TIVA and Midazolam  Airway Management Planned:   Additional Equipment:   Intra-op Plan:   Post-operative Plan:   Informed Consent: I have reviewed the patients History and Physical, chart, labs and discussed the procedure including the risks, benefits and alternatives for the proposed anesthesia with the patient or authorized representative who has indicated his/her understanding and acceptance.     Dental Advisory Given  Plan Discussed with: CRNA  Anesthesia Plan Comments:         Anesthesia Quick Evaluation

## 2020-07-24 NOTE — Transfer of Care (Signed)
Immediate Anesthesia Transfer of Care Note  Patient: Ivan Reyes  Procedure(s) Performed: BLEPHAROPLASTY UPPER EYELID; W/EXCESS SKIN BILATERAL (Bilateral )  Patient Location: PACU  Anesthesia Type: MAC  Level of Consciousness: awake, alert  and patient cooperative  Airway and Oxygen Therapy: Patient Spontanous Breathing and Patient connected to supplemental oxygen  Post-op Assessment: Post-op Vital signs reviewed, Patient's Cardiovascular Status Stable, Respiratory Function Stable, Patent Airway and No signs of Nausea or vomiting  Post-op Vital Signs: Reviewed and stable  Complications: No complications documented.

## 2020-07-24 NOTE — Anesthesia Postprocedure Evaluation (Signed)
Anesthesia Post Note  Patient: Ivan Reyes  Procedure(s) Performed: BLEPHAROPLASTY UPPER EYELID; W/EXCESS SKIN BILATERAL (Bilateral )     Patient location during evaluation: PACU Anesthesia Type: MAC Level of consciousness: awake and alert and oriented Pain management: satisfactory to patient Vital Signs Assessment: post-procedure vital signs reviewed and stable Respiratory status: spontaneous breathing, nonlabored ventilation and respiratory function stable Cardiovascular status: blood pressure returned to baseline and stable Postop Assessment: Adequate PO intake and No signs of nausea or vomiting Anesthetic complications: no   No complications documented.  Raliegh Ip

## 2020-07-25 ENCOUNTER — Encounter: Payer: Self-pay | Admitting: Ophthalmology

## 2020-07-26 NOTE — Progress Notes (Deleted)
Ivan Reyes  [phone] [phone]  ID: Ivan Reyes OB: 1946-02-17  MR#: [id number]  MWU#:[id number]  Patient Care Team: Baxter Hire, MD as PCP - General (Internal Medicine) Lloyd Huger, MD as Consulting Physician (Hematology and Oncology)  CHIEF COMPLAINT: Iron deficiency anemia.  INTERVAL HISTORY: Patient returns to clinic today for repeat laboratory work, further evaluation, and continuation of IV Feraheme.  He felt initially improved after receiving IV iron several months ago, but still has persistent chronic weakness and fatigue.  He otherwise feels well.  He has no neurologic complaints.  He denies any recent fevers or illnesses.  He has a good appetite and denies weight loss.  He denies any chest pain, shortness of breath, cough, or hemoptysis.  He denies any nausea, vomiting, constipation, or diarrhea.  He has no melena or hematochezia.  He has no urinary complaints.  Patient offers no further specific complaints today.  REVIEW OF SYSTEMS:   Review of Systems  Constitutional: Positive for malaise/fatigue. Negative for fever and weight loss.  Respiratory: Negative.  Negative for cough and shortness of breath.   Cardiovascular: Negative.  Negative for chest pain and leg swelling.  Gastrointestinal: Negative.  Negative for abdominal pain, blood in stool and melena.  Genitourinary: Negative.  Negative for dysuria and hematuria.  Musculoskeletal: Negative.  Negative for back pain.  Skin: Negative.  Negative for rash.  Neurological: Positive for weakness. Negative for dizziness, focal weakness and headaches.  Psychiatric/Behavioral: Negative.  The patient is not nervous/anxious.     As per HPI. Otherwise, a complete review of systems is negative.  PAST MEDICAL HISTORY: Past Medical History:  Diagnosis Date  . Arthritis   . Coronary artery disease   . GERD (gastroesophageal reflux disease)   . Hyperlipidemia   .  Hypertension   . Mitral valve disorder   . Peripheral vascular disease ([place])   . Sleep apnea    use C-PAP    PAST SURGICAL HISTORY: Past Surgical History:  Procedure Laterality Date  . BROW LIFT Bilateral 07/24/2020   Procedure: BLEPHAROPLASTY UPPER EYELID; W/EXCESS SKIN BILATERAL;  Surgeon: Karle Starch, MD;  Location: [place];  Service: Ophthalmology;  Laterality: Bilateral;  sleep apnea  . CAROTID ENDARTERECTOMY Bilateral    Dr. Delana Meyer, [facility]  . CARPAL TUNNEL RELEASE Left 12/23/2016   Procedure: CARPAL TUNNEL RELEASE ENDOSCOPIC;  Surgeon: Corky Mull, MD;  Location: [facility];  Service: Orthopedics;  Laterality: Left;  . CARPAL TUNNEL RELEASE Right 01/13/2017   Procedure: CARPAL TUNNEL RELEASE ENDOSCOPIC;  Surgeon: Corky Mull, MD;  Location: [facility];  Service: Orthopedics;  Laterality: Right;  . COLONOSCOPY WITH PROPOFOL N/A 09/04/2018   Procedure: COLONOSCOPY WITH PROPOFOL;  Surgeon: Manya Silvas, MD;  Location: [facility]COPY;  Service: Endoscopy;  Laterality: N/A;  . CORONARY ANGIOPLASTY     3 stents  . coronary atherosclerosis of  autologous graft    . ESOPHAGOGASTRODUODENOSCOPY (EGD) WITH PROPOFOL N/A 12/12/2017   Procedure: ESOPHAGOGASTRODUODENOSCOPY (EGD) WITH PROPOFOL;  Surgeon: Manya Silvas, MD;  Location: [facility] END[facility]COPY;  Service: Endoscopy;  Laterality: N/A;  . ESOPHAGOGASTRODUODENOSCOPY (EGD) WITH PROPOFOL N/A 09/04/2018   Procedure: ESOPHAGOGASTRODUODENOSCOPY (EGD) WITH PROPOFOL;  Surgeon: Manya Silvas, MD;  Location: [facility] ENDOSCOPY;  Service: Endoscopy;  Laterality: N/A;  . EYE SURGERY Bilateral    Cataract Extraction with IOL  . FRACTURE SURGERY Right    Hip Pinning, Dr. Franchot Mimes, Jr  . JOINT REPLACEMENT  left knee  . KNEE ARTHROPLASTY Left 06/30/2016   Procedure: COMPUTER ASSISTED TOTAL KNEE ARTHROPLASTY;  Surgeon: Dereck Leep, MD;  Location: [facility];  Service: Orthopedics;  Laterality: Left;  . LOWER EXTREMITY ANGIOGRAPHY  Left 05/06/2020   Procedure: LOWER EXTREMITY ANGIOGRAPHY;  Surgeon: Katha Cabal, MD;  Location: [facility];  Service: Cardiovascular;  Laterality: Left;  Marland Kitchen MASS EXCISION Left 01/11/2018   Procedure: EXCISION CYSTIC MASS ANTERIOR KNEE;  Surgeon: Dereck Leep, MD;  Location: [facility];  Service: Orthopedics;  Laterality: Left;    FAMILY HISTORY: No family history on file.  ADVANCED DIRECTIVES (Y/N):  N  HEALTH MAINTENANCE: Social History   Tobacco Use  . Smoking status: Current Every Day Smoker    Packs/day: 0.50    Years: 60.00    Pack years: 30.00    Types: Cigarettes  . Smokeless tobacco: Never Used  . Tobacco comment: since age 27  Vaping Use  . Vaping Use: Never used  Substance Use Topics  . Alcohol use: Yes    Alcohol/week: 4.0 standard drinks    Types: 2 Cans of beer, 2 Standard drinks or equivalent per week    Comment: 4/ week  . Drug use: No     Colonoscopy:  PAP:  Bone density:  Lipid panel:  No Known Allergies  Current Outpatient Medications  Medication Sig Dispense Refill  . Cholecalciferol (VITAMIN D) 2000 units CAPS Take 2,000 Units by mouth every evening.    . clopidogrel (PLAVIX) 75 MG tablet Take 75 mg by mouth daily.    . enalapril (VASOTEC) 20 MG tablet Take 20 mg by mouth 2 (two) times daily.    Marland Kitchen erythromycin ophthalmic ointment Apply to sutures 4 times a day for 10-12 days.  Discontinue if allergy develops and call our office 3.5 g 2  . fenofibrate micronized (LOFIBRA) 134 MG capsule Take 134 mg by mouth daily before breakfast.     . Glucosamine HCl (GLUCOSAMINE PO) Take 1 tablet by mouth 2 (two) times daily.    . hydrochlorothiazide (HYDRODIURIL) 12.5 MG tablet Take 12.5 mg by mouth daily.    . Multiple Vitamins-Minerals (PX COMPLETE SENIOR MULTIVITS) TABS Take 1 tablet by mouth.    . Omega-3 Fatty Acids (OMEGA-3 FISH OIL) 300 MG CAPS Take 300 mg by mouth 2 (two) times daily.    . pantoprazole (PROTONIX) 20 MG tablet Take 20 mg  by mouth every morning.    . sildenafil (REVATIO) 20 MG tablet Take 60-100 mg by mouth daily as needed (for erectile dysfunction). Pt states 2 morning and night    . simvastatin (ZOCOR) 40 MG tablet Take 40 mg by mouth daily at 6 PM.     . tamsulosin (FLOMAX) 0.4 MG CAPS capsule Take 0.4 mg by mouth daily. (Patient not taking: Reported on 03/27/2020)    . traMADol (ULTRAM) 50 MG tablet Take 1 every 4-6 hours as needed for pain not controlled by Tylenol 6 tablet 0  . vitamin B-12 (CYANOCOBALAMIN) 1000 MCG tablet Take 1,000 mcg by mouth daily.    . vitamin E 400 UNIT capsule Take 400 Units by mouth every evening.      No current facility-administered medications for this visit.    OBJECTIVE: There were no vitals filed for this visit.   There is no height or weight on file to calculate BMI.    ECOG FS:0 - Asymptomatic  General: Well-developed, well-nourished, no acute distress. Eyes: Pink conjunctiva, anicteric sclera. HEENT: Normocephalic, moist  mucous membranes. Lungs: No audible wheezing or coughing. Heart: Regular rate and rhythm. Abdomen: Soft, nontender, no obvious distention. Musculoskeletal: No edema, cyanosis, or clubbing. Neuro: Alert, answering all questions appropriately. Cranial nerves grossly intact. Skin: No rashes or petechiae noted. Psych: Normal affect.   LAB RESULTS:  Lab Results  Component Value Date   NA 137 12/30/2017   K 4.0 12/30/2017   CL 105 12/30/2017   CO2 24 12/30/2017   GLUCOSE 89 12/30/2017   BUN 13 05/06/2020   CREATININE 1.00 05/06/2020   CALCIUM 9.4 12/30/2017   PROT 7.5 06/16/2016   ALBUMIN 4.4 06/16/2016   AST 26 06/16/2016   ALT 16 (L) 06/16/2016   ALKPHOS 39 06/16/2016   BILITOT 0.6 06/16/2016   GFRNONAA >60 05/06/2020   GFRAA >60 05/06/2020    Lab Results  Component Value Date   WBC 7.5 07/16/2020   NEUTROABS 4.7 07/16/2020   HGB 7.7 (L) 07/16/2020   HCT 25.2 (L) 07/16/2020   MCV 78.8 (L) 07/16/2020   PLT 309 07/16/2020   Lab  Results  Component Value Date   IRON 13 (L) 07/16/2020   TIBC 456 (H) 07/16/2020   IRONPCTSAT 3 (L) 07/16/2020   Lab Results  Component Value Date   FERRITIN 5 (L) 07/16/2020     STUDIES: No results found.  ASSESSMENT: Iron deficiency anemia.  PLAN:   1.  Iron deficiency anemia: Patient's hemoglobin has significantly improved and is now essentially within normal limits at 12.6.  His iron stores remain decreased, therefore will pursue an additional 510 mg IV Feraheme today.  Colonoscopy and upper endoscopy on September 04, 2018 did not reveal any significant pathology.  Return to clinic in 3 months with repeat laboratory work, further evaluation, and continuation of treatment if needed.  I spent a total of 30 minutes reviewing chart data, face-to-face evaluation with the patient, counseling and coordination of care as detailed above.   Patient expressed understanding and was in agreement with this plan. He also understands that He can call clinic at any time with any questions, concerns, or complaints.    Lloyd Huger, MD   07/26/2020 9:10 AM

## 2020-07-31 ENCOUNTER — Inpatient Hospital Stay: Payer: PPO

## 2020-07-31 ENCOUNTER — Inpatient Hospital Stay: Payer: PPO | Admitting: Oncology

## 2020-08-26 ENCOUNTER — Other Ambulatory Visit (INDEPENDENT_AMBULATORY_CARE_PROVIDER_SITE_OTHER): Payer: Self-pay | Admitting: Nurse Practitioner

## 2020-08-26 DIAGNOSIS — I739 Peripheral vascular disease, unspecified: Secondary | ICD-10-CM

## 2020-08-28 ENCOUNTER — Other Ambulatory Visit: Payer: Self-pay

## 2020-08-28 ENCOUNTER — Ambulatory Visit (INDEPENDENT_AMBULATORY_CARE_PROVIDER_SITE_OTHER): Payer: PPO

## 2020-08-28 ENCOUNTER — Ambulatory Visit (INDEPENDENT_AMBULATORY_CARE_PROVIDER_SITE_OTHER): Payer: PPO | Admitting: Vascular Surgery

## 2020-08-28 ENCOUNTER — Encounter (INDEPENDENT_AMBULATORY_CARE_PROVIDER_SITE_OTHER): Payer: Self-pay | Admitting: Vascular Surgery

## 2020-08-28 VITALS — BP 157/70 | HR 70 | Resp 16 | Wt 180.8 lb

## 2020-08-28 DIAGNOSIS — I25118 Atherosclerotic heart disease of native coronary artery with other forms of angina pectoris: Secondary | ICD-10-CM | POA: Diagnosis not present

## 2020-08-28 DIAGNOSIS — E782 Mixed hyperlipidemia: Secondary | ICD-10-CM | POA: Diagnosis not present

## 2020-08-28 DIAGNOSIS — I739 Peripheral vascular disease, unspecified: Secondary | ICD-10-CM | POA: Diagnosis not present

## 2020-08-28 DIAGNOSIS — I10 Essential (primary) hypertension: Secondary | ICD-10-CM

## 2020-08-28 DIAGNOSIS — I6523 Occlusion and stenosis of bilateral carotid arteries: Secondary | ICD-10-CM | POA: Diagnosis not present

## 2020-08-28 DIAGNOSIS — I70213 Atherosclerosis of native arteries of extremities with intermittent claudication, bilateral legs: Secondary | ICD-10-CM | POA: Diagnosis not present

## 2020-08-29 ENCOUNTER — Inpatient Hospital Stay: Payer: PPO | Attending: Oncology

## 2020-08-29 DIAGNOSIS — G4730 Sleep apnea, unspecified: Secondary | ICD-10-CM | POA: Diagnosis not present

## 2020-08-29 DIAGNOSIS — E785 Hyperlipidemia, unspecified: Secondary | ICD-10-CM | POA: Insufficient documentation

## 2020-08-29 DIAGNOSIS — D509 Iron deficiency anemia, unspecified: Secondary | ICD-10-CM | POA: Insufficient documentation

## 2020-08-29 DIAGNOSIS — I10 Essential (primary) hypertension: Secondary | ICD-10-CM | POA: Diagnosis not present

## 2020-08-29 DIAGNOSIS — K219 Gastro-esophageal reflux disease without esophagitis: Secondary | ICD-10-CM | POA: Diagnosis not present

## 2020-08-29 DIAGNOSIS — F17210 Nicotine dependence, cigarettes, uncomplicated: Secondary | ICD-10-CM | POA: Insufficient documentation

## 2020-08-29 DIAGNOSIS — Z79899 Other long term (current) drug therapy: Secondary | ICD-10-CM | POA: Insufficient documentation

## 2020-08-29 DIAGNOSIS — I2510 Atherosclerotic heart disease of native coronary artery without angina pectoris: Secondary | ICD-10-CM | POA: Insufficient documentation

## 2020-08-29 LAB — CBC WITH DIFFERENTIAL/PLATELET
Abs Immature Granulocytes: 0.02 10*3/uL (ref 0.00–0.07)
Basophils Absolute: 0.1 10*3/uL (ref 0.0–0.1)
Basophils Relative: 1 %
Eosinophils Absolute: 0.2 10*3/uL (ref 0.0–0.5)
Eosinophils Relative: 3 %
HCT: 26.4 % — ABNORMAL LOW (ref 39.0–52.0)
Hemoglobin: 8.1 g/dL — ABNORMAL LOW (ref 13.0–17.0)
Immature Granulocytes: 0 %
Lymphocytes Relative: 25 %
Lymphs Abs: 1.6 10*3/uL (ref 0.7–4.0)
MCH: 20.5 pg — ABNORMAL LOW (ref 26.0–34.0)
MCHC: 30.7 g/dL (ref 30.0–36.0)
MCV: 66.7 fL — ABNORMAL LOW (ref 80.0–100.0)
Monocytes Absolute: 0.5 10*3/uL (ref 0.1–1.0)
Monocytes Relative: 9 %
Neutro Abs: 3.8 10*3/uL (ref 1.7–7.7)
Neutrophils Relative %: 62 %
Platelets: 325 10*3/uL (ref 150–400)
RBC: 3.96 MIL/uL — ABNORMAL LOW (ref 4.22–5.81)
RDW: 19.9 % — ABNORMAL HIGH (ref 11.5–15.5)
WBC: 6.1 10*3/uL (ref 4.0–10.5)
nRBC: 0 % (ref 0.0–0.2)

## 2020-08-29 LAB — IRON AND TIBC
Iron: 9 ug/dL — ABNORMAL LOW (ref 45–182)
Saturation Ratios: 2 % — ABNORMAL LOW (ref 17.9–39.5)
TIBC: 512 ug/dL — ABNORMAL HIGH (ref 250–450)
UIBC: 503 ug/dL

## 2020-08-29 LAB — FERRITIN: Ferritin: 6 ng/mL — ABNORMAL LOW (ref 24–336)

## 2020-08-31 NOTE — Progress Notes (Signed)
MRN : [id number]  Ivan Reyes is a 75 y.o. (04/17/1946) male who presents with chief complaint of  Chief Complaint  Patient presents with  . Follow-up    3 month ABI  .  History of Present Illness:   The patient returns to the office for followup and review status post angiogram with intervention on 05/06/2020.  Procedure: Crosser atherectomy of the left SFA and popliteal arteries with percutaneous transluminal angioplasty and stent placement left superficial femoral artery and popliteal to 5 mm and percutaneous transluminal angioplasty of the left tibial peroneal trunk to 4 mm with a [employer] drug-eluting balloon                        The patient notes improvement in the lower extremity symptoms. No interval shortening of the patient's claudication distance or rest pain symptoms. Previous wounds have now healed.  No new ulcers or wounds have occurred since the last visit.  There have been no significant changes to the patient's overall health care.  The patient denies amaurosis fugax or recent TIA symptoms. There are no recent neurological changes noted. The patient denies history of DVT, PE or superficial thrombophlebitis. The patient denies recent episodes of angina or shortness of breath.   ABI's Rt=0.89 and Lt=0.79  (previous ABI's Rt=1.02 and Lt=1.06)   Current Meds  Medication Sig  . Cholecalciferol (VITAMIN D) 2000 units CAPS Take 2,000 Units by mouth every evening.  . clopidogrel (PLAVIX) 75 MG tablet Take 75 mg by mouth daily.  . enalapril (VASOTEC) 20 MG tablet Take 10 mg by mouth 2 (two) times daily.  . fenofibrate micronized (LOFIBRA) 134 MG capsule Take 134 mg by mouth daily before breakfast.   . Glucosamine HCl (GLUCOSAMINE PO) Take 1 tablet by mouth 2 (two) times daily.  . hydrochlorothiazide (HYDRODIURIL) 12.5 MG tablet Take 12.5 mg by mouth daily.  . Multiple Vitamins-Minerals (PX COMPLETE SENIOR MULTIVITS) TABS Take 1 tablet by mouth.  . Omega-3 Fatty  Acids (OMEGA-3 FISH OIL) 300 MG CAPS Take 300 mg by mouth 2 (two) times daily.  . pantoprazole (PROTONIX) 20 MG tablet Take 20 mg by mouth every morning.  . sildenafil (REVATIO) 20 MG tablet Take 60-100 mg by mouth daily as needed (for erectile dysfunction). Pt states 2 morning and night  . simvastatin (ZOCOR) 40 MG tablet Take 40 mg by mouth daily at 6 PM.   . vitamin B-12 (CYANOCOBALAMIN) 1000 MCG tablet Take 1,000 mcg by mouth daily.  . vitamin E 400 UNIT capsule Take 400 Units by mouth every evening.     Past Medical History:  Diagnosis Date  . Arthritis   . Coronary artery disease   . GERD (gastroesophageal reflux disease)   . Hyperlipidemia   . Hypertension   . Mitral valve disorder   . Peripheral vascular disease (H[facility])   . Sleep apnea    use C-PAP    Past Surgical History:  Procedure Laterality Date  . BROW LIFT Bilateral 07/24/2020   Procedure: BLEPHAROPLASTY UPPER EYELID; W/EXCESS SKIN BILATERAL;  Surgeon: Imagene Riches, MD;  Location: [facility];  Service: Ophthalmology;  Laterality: Bilateral;  sleep apnea  . CAROTID ENDARTERECTOMY Bilateral    Dr. Gilda Crease, [facility], [place]  . CARPAL TUNNEL RELEASE Left 12/23/2016   Procedure: CARPAL TUNNEL RELEASE ENDOSCOPIC;  Surgeon: Christena Flake, MD;  Location: [facility];  Service: Orthopedics;  Laterality: Left;  . CARPAL TUNNEL RELEASE Right 01/13/2017   Procedure: CARPAL  TUNNEL RELEASE ENDOSCOPIC;  Surgeon: Corky Mull, MD;  Location: [facility];  Service: Orthopedics;  Laterality: Right;  . COLONOSCOPY WITH PROPOFOL N/A 09/04/2018   Procedure: COLONOSCOPY WITH PROPOFOL;  Surgeon: Manya Silvas, MD;  Location: [facility] ENDOSCOPY;  Service: Endoscopy;  Laterality: N/A;  . CORONARY ANGIOPLASTY     3 stents  . coronary atherosclerosis of  autologous graft    . ESOPHAGOGASTRODUODENOSCOPY (EGD) WITH PROPOFOL N/A 12/12/2017   Procedure: ESOPHAGOGASTRODUODENOSCOPY (EGD) WITH PROPOFOL;  Surgeon: Manya Silvas, MD;  Location: [facility] END[facility]COPY;   Service: Endoscopy;  Laterality: N/A;  . ESOPHAGOGASTRODUODENOSCOPY (EGD) WITH PROPOFOL N/A 09/04/2018   Procedure: ESOPHAGOGASTRODUODENOSCOPY (EGD) WITH PROPOFOL;  Surgeon: Manya Silvas, MD;  Location: [facility];  Service: Endoscopy;  Laterality: N/A;  . EYE SURGERY Bilateral    Cataract Extraction with IOL  . FRACTURE SURGERY Right    Hip Pinning, Dr. Franchot Mimes, Jr  . JOINT REPLACEMENT     left knee  . KNEE ARTHROPLASTY Left 06/30/2016   Procedure: COMPUTER ASSISTED TOTAL KNEE ARTHROPLASTY;  Surgeon: Dereck Leep, MD;  Location: [facility];  Service: Orthopedics;  Laterality: Left;  . LOWER EXTREMITY ANGIOGRAPHY Left 05/06/2020   Procedure: LOWER EXTREMITY ANGIOGRAPHY;  Surgeon: Katha Cabal, MD;  Location: [place] [facility];  Service: Cardiovascular;  Laterality: Left;  Marland Kitchen MASS EXCISION Left 01/11/2018   Procedure: EXCISION CYSTIC MASS ANTERIOR KNEE;  Surgeon: Dereck Leep, MD;  Location: [facility];  Service: Orthopedics;  Laterality: Left;    Social History Social History   Tobacco Use  . Smoking status: Current Every Day Smoker    Packs/day: 0.50    Years: 60.00    Pack years: 30.00    Types: Cigarettes  . Smokeless tobacco: Never Used  . Tobacco comment: since age 88  Vaping Use  . Vaping Use: Never used  Substance Use Topics  . Alcohol use: Yes    Alcohol/week: 4.0 standard drinks    Types: 2 Cans of beer, 2 Standard drinks or equivalent per week    Comment: 4/ week  . Drug use: No    Family History No family history on file.  No Known Allergies   REVIEW OF SYSTEMS (Negative unless checked)  Constitutional: [] Weight loss  [] Fever  [] Chills Cardiac: [] Chest pain   [] Chest pressure   [] Palpitations   [] Shortness of breath when laying flat   [] Shortness of breath with exertion. Vascular:  [x] Pain in legs with walking   [] Pain in legs at rest  [] History of DVT   [] Phlebitis   [] Swelling in legs   [] Varicose veins   [] Non-healing  ulcers Pulmonary:   [] Uses home oxygen   [] Productive cough   [] Hemoptysis   [] Wheeze  [] COPD   [] Asthma Neurologic:  [] Dizziness   [] Seizures   [] History of stroke   [] History of TIA  [] Aphasia   [] Vissual changes   [] Weakness or numbness in arm   [] Weakness or numbness in leg Musculoskeletal:   [] Joint swelling   [x] Joint pain   [x] Low back pain Hematologic:  [] Easy bruising  [] Easy bleeding   [] Hypercoagulable state   [] Anemic Gastrointestinal:  [] Diarrhea   [] Vomiting  [] Gastroesophageal reflux/heartburn   [] Difficulty swallowing. Genitourinary:  [] Chronic kidney disease   [] Difficult urination  [] Frequent urination   [] Blood in urine Skin:  [] Rashes   [] Ulcers  Psychological:  [] History of anxiety   []  History of major depression.  Physical Examination  Vitals:   08/28/20 1449  BP: (!) 157/70  Pulse: 70  Resp: 16  Weight: 180 lb 12.8 oz (82 kg)   Body mass index is 25.94 kg/m. Gen: WD/WN, NAD Head: [place]/AT, No temporalis wasting.  Ear/Nose/Throat: Hearing grossly intact, nares w/o erythema or drainage Eyes: PER, EOMI, sclera nonicteric.  Neck: Supple, no large masses.   Pulmonary:  Good air movement, no audible wheezing bilaterally, no use of accessory muscles.  Cardiac: RRR, no JVD Vascular:  Vessel Right Left  Radial Palpable Palpable  PT Palpable Trace Palpable  DP Palpable Not Palpable  Gastrointestinal: Non-distended. No guarding/no peritoneal signs.  Musculoskeletal: M/S 5/5 throughout.  No deformity or atrophy.  Neurologic: CN 2-12 intact. Symmetrical.  Speech is fluent. Motor exam as listed above. Psychiatric: Judgment intact, Mood & affect appropriate for pt's clinical situation. Dermatologic: No rashes or ulcers noted.  No changes consistent with cellulitis.  CBC Lab Results  Component Value Date   WBC 6.1 08/29/2020   HGB 8.1 (L) 08/29/2020   HCT 26.4 (L) 08/29/2020   MCV 66.7 (L) 08/29/2020   PLT 325 08/29/2020    BMET    Component Value Date/Time    NA 137 12/30/2017 1150   K 4.0 12/30/2017 1150   K 4.1 06/28/2014 1603   CL 105 12/30/2017 1150   CO2 24 12/30/2017 1150   GLUCOSE 89 12/30/2017 1150   BUN 13 05/06/2020 1137   CREATININE 1.00 05/06/2020 1137   CALCIUM 9.4 12/30/2017 1150   GFRNONAA >60 05/06/2020 1137   GFRAA >60 05/06/2020 1137   CrCl cannot be calculated (Patient's most recent lab result is older than the maximum 21 days allowed.).  COAG Lab Results  Component Value Date   INR 0.92 06/16/2016    Radiology VAS [place] ABI WITH/WO TBI  Result Date: 08/28/2020 LOWER EXTREMITY DOPPLER STUDY  Vascular Interventions: 05/06/2020. Comparison Study: 05/28/2020 Performing Technologist: Charlane Ferretti RT (R)(VS)  Examination Guidelines: A complete evaluation includes at minimum, Doppler waveform signals and systolic blood pressure reading at the level of bilateral brachial, anterior tibial, and posterior tibial arteries, when vessel segments are accessible. Bilateral testing is considered an integral part of a complete examination. Photoelectric Plethysmograph (PPG) waveforms and toe systolic pressure readings are included as required and additional duplex testing as needed. Limited examinations for reoccurring indications may be performed as noted.  ABI Findings: +---------+------------------+-----+---------+--------+ Right    Rt Pressure (mmHg)IndexWaveform Comment  +---------+------------------+-----+---------+--------+ Brachial 147                                      +---------+------------------+-----+---------+--------+ ATA      131               0.89 triphasic         +---------+------------------+-----+---------+--------+ PTA      131               0.89 triphasic         +---------+------------------+-----+---------+--------+ Great Toe73                0.50 Dampened          +---------+------------------+-----+---------+--------+ +---------+------------------+-----+--------+-------+ Left     Lt  Pressure (mmHg)IndexWaveformComment +---------+------------------+-----+--------+-------+ Brachial 141                                    +---------+------------------+-----+--------+-------+ ATA      113  0.77 biphasic        +---------+------------------+-----+--------+-------+ PTA      116               0.79 biphasic        +---------+------------------+-----+--------+-------+ Great Toe65                0.44 Dampened        +---------+------------------+-----+--------+-------+ +-------+-----------+-----------+------------+------------+ ABI/TBIToday's ABIToday's TBIPrevious ABIPrevious TBI +-------+-----------+-----------+------------+------------+ Right  .89        .50        1.02        .80          +-------+-----------+-----------+------------+------------+ Left   .79        .44        1.06        .61          +-------+-----------+-----------+------------+------------+ Bilateral ABIs appear decreased compared to prior study on 05/28/2020. Bilateral TBIs appear decreased compared to prior study on 05/28/2020.  Summary: Right: Resting right ankle-brachial index indicates mild right lower extremity arterial disease. The right toe-brachial index is abnormal. Left: Resting left ankle-brachial index indicates moderate left lower extremity arterial disease. The left toe-brachial index is abnormal. *See table(s) above for measurements and observations.  Electronically signed by Hortencia Pilar MD on 08/28/2020 at 4:39:13 PM.   Final      Assessment/Plan 1. Atherosclerosis of native artery of both lower extremities with intermittent claudication (HCC) Recommend:  The patient is status post successful angiogram with intervention.  The patient reports that the claudication symptoms and leg pain is essentially gone.   The patient denies lifestyle limiting changes at this point in time.  No further invasive studies, angiography or surgery at this time The  patient should continue walking and begin a more formal exercise program.  The patient should continue antiplatelet therapy and aggressive treatment of the lipid abnormalities  Smoking cessation was again discussed  The patient should continue wearing graduated compression socks 10-15 mmHg strength to control the mild edema.  Patient should undergo noninvasive studies as ordered. The patient will follow up with me after the studies.   - VAS [place] LOWER EXTREMITY ARTERIAL DUPLEX; Future - VAS [place] ABI WITH/WO TBI; Future  2. Bilateral carotid artery stenosis Recommend:  Given the patient's asymptomatic subcritical stenosis no further invasive testing or surgery at this time.  Continue antiplatelet therapy as prescribed Continue management of CAD, HTN and Hyperlipidemia Healthy heart diet,  encouraged exercise at least 4 times per week Follow up in 12 months with duplex ultrasound and physical exam   3. Benign essential hypertension Continue antihypertensive medications as already ordered, these medications have been reviewed and there are no changes at this time.   4. Coronary artery disease of native artery of native heart with stable angina pectoris (HCC) Continue cardiac and antihypertensive medications as already ordered and reviewed, no changes at this time.  Continue statin as ordered and reviewed, no changes at this time  Nitrates PRN for chest pain   5. Mixed hyperlipidemia Continue statin as ordered and reviewed, no changes at this time    Hortencia Pilar, MD  08/31/2020 1:49 PM

## 2020-09-01 ENCOUNTER — Inpatient Hospital Stay: Payer: PPO

## 2020-09-01 ENCOUNTER — Inpatient Hospital Stay: Payer: PPO | Admitting: Oncology

## 2020-09-02 ENCOUNTER — Inpatient Hospital Stay (HOSPITAL_BASED_OUTPATIENT_CLINIC_OR_DEPARTMENT_OTHER): Payer: PPO | Admitting: Nurse Practitioner

## 2020-09-02 ENCOUNTER — Other Ambulatory Visit: Payer: Self-pay

## 2020-09-02 ENCOUNTER — Inpatient Hospital Stay: Payer: PPO

## 2020-09-02 VITALS — BP 159/66 | HR 59 | Resp 20

## 2020-09-02 VITALS — BP 169/59 | HR 71 | Temp 97.8°F | Resp 18 | Wt 182.0 lb

## 2020-09-02 DIAGNOSIS — D509 Iron deficiency anemia, unspecified: Secondary | ICD-10-CM

## 2020-09-02 MED ORDER — SODIUM CHLORIDE 0.9 % IV SOLN
510.0000 mg | Freq: Once | INTRAVENOUS | Status: AC
  Administered 2020-09-02: 510 mg via INTRAVENOUS
  Filled 2020-09-02: qty 510

## 2020-09-02 MED ORDER — SODIUM CHLORIDE 0.9 % IV SOLN
Freq: Once | INTRAVENOUS | Status: AC
  Filled 2020-09-02: qty 250

## 2020-09-02 NOTE — Progress Notes (Signed)
[place]  [phone] [phone]) [phone]  ID: Ivan Reyes OB: Nov 14, 1945  MR#: [id number]  KYH#:[id number]  Patient Care Team: Baxter Hire, MD as PCP - General (Internal Medicine) Lloyd Huger, MD as Consulting Physician (Hematology and Oncology)  CHIEF COMPLAINT: Iron deficiency anemia.  INTERVAL HISTORY: Patient returns to clinic today for discussion of lab results and evaluation and continuation of IV Feraheme.  Symptoms of weakness and fatigue have improved with previous IV Feraheme treatments.  Tolerated treatments well without significant side effects.  Continues to have fatigue worse in the afternoons.  No melena or hematochezia. Was previously scheduled to have a capsule endoscopy which was canceled due to [place] in 2021.  He has not followed up with GI and has an appointment in May of this year.  Otherwise he feels well.  No neurologic complaints.  No recent fevers or illness.  His appetite is good and he denies weight loss.  No chest pain, shortness of breath, cough, hemoptysis.  No nausea, vomiting, constipation, or diarrhea.  No urinary complaints.  No further specific complaints today.  REVIEW OF SYSTEMS:   Review of Systems  Constitutional: Positive for malaise/fatigue. Negative for fever and weight loss.  Respiratory: Negative.  Negative for cough and shortness of breath.   Cardiovascular: Negative.  Negative for chest pain and leg swelling.  Gastrointestinal: Negative.  Negative for abdominal pain, blood in stool and melena.  Genitourinary: Negative.  Negative for dysuria and hematuria.  Musculoskeletal: Negative.  Negative for back pain.  Skin: Negative.  Negative for rash.  Neurological: Negative for dizziness, focal weakness, weakness and headaches.  Psychiatric/Behavioral: Negative.  The patient is not nervous/anxious.   As per HPI. Otherwise, a complete review of systems is negative.  PAST MEDICAL HISTORY: Past  Medical History:  Diagnosis Date  . Arthritis   . Coronary artery disease   . GERD (gastroesophageal reflux disease)   . Hyperlipidemia   . Hypertension   . Mitral valve disorder   . Peripheral vascular disease ([place])   . Sleep apnea    use C-PAP    PAST SURGICAL HISTORY: Past Surgical History:  Procedure Laterality Date  . BROW LIFT Bilateral 07/24/2020   Procedure: BLEPHAROPLASTY UPPER EYELID; W/EXCESS SKIN BILATERAL;  Surgeon: Karle Starch, MD;  Location: [facility];  Service: Ophthalmology;  Laterality: Bilateral;  sleep apnea  . CAROTID ENDARTERECTOMY Bilateral    Dr. Delana Meyer, [facility]  . CARPAL TUNNEL RELEASE Left 12/23/2016   Procedure: CARPAL TUNNEL RELEASE ENDOSCOPIC;  Surgeon: Corky Mull, MD;  Location: [facility];  Service: Orthopedics;  Laterality: Left;  . CARPAL TUNNEL RELEASE Right 01/13/2017   Procedure: CARPAL TUNNEL RELEASE ENDOSCOPIC;  Surgeon: Corky Mull, MD;  Location: [facility];  Service: Orthopedics;  Laterality: Right;  . COLONOSCOPY WITH PROPOFOL N/A 09/04/2018   Procedure: COLONOSCOPY WITH PROPOFOL;  Surgeon: Manya Silvas, MD;  Location: [facility] ENDOSCOPY;  Service: Endoscopy;  Laterality: N/A;  . CORONARY ANGIOPLASTY     3 stents  . coronary atherosclerosis of  autologous graft    . ESOPHAGOGASTRODUODENOSCOPY (EGD) WITH PROPOFOL N/A 12/12/2017   Procedure: ESOPHAGOGASTRODUODENOSCOPY (EGD) WITH PROPOFOL;  Surgeon: Manya Silvas, MD;  Location: [facility] ENDOSCOPY;  Service: Endoscopy;  Laterality: N/A;  . ESOPHAGOGASTRODUODENOSCOPY (EGD) WITH PROPOFOL N/A 09/04/2018   Procedure: ESOPHAGOGASTRODUODENOSCOPY (EGD) WITH PROPOFOL;  Surgeon: Manya Silvas, MD;  Location: [facility] ENDOSCOPY;  Service: Endoscopy;  Laterality: N/A;  . EYE SURGERY Bilateral    Cataract Extraction  with IOL  . FRACTURE SURGERY Right    Hip Pinning, Dr. Franchot Mimes, Jr  . JOINT REPLACEMENT     left knee  . KNEE ARTHROPLASTY Left 06/30/2016   Procedure: COMPUTER ASSISTED  TOTAL KNEE ARTHROPLASTY;  Surgeon: Dereck Leep, MD;  Location: [facility];  Service: Orthopedics;  Laterality: Left;  . LOWER EXTREMITY ANGIOGRAPHY Left 05/06/2020   Procedure: LOWER EXTREMITY ANGIOGRAPHY;  Surgeon: Katha Cabal, MD;  Location: [facility];  Service: Cardiovascular;  Laterality: Left;  Marland Kitchen MASS EXCISION Left 01/11/2018   Procedure: EXCISION CYSTIC MASS ANTERIOR KNEE;  Surgeon: Dereck Leep, MD;  Location: [facility];  Service: Orthopedics;  Laterality: Left;    FAMILY HISTORY: No family history on file.  ADVANCED DIRECTIVES (Y/N):  N  HEALTH MAINTENANCE: Social History   Tobacco Use  . Smoking status: Current Every Day Smoker    Packs/day: 0.50    Years: 60.00    Pack years: 30.00    Types: Cigarettes  . Smokeless tobacco: Never Used  . Tobacco comment: since age 45  Vaping Use  . Vaping Use: Never used  Substance Use Topics  . Alcohol use: Yes    Alcohol/week: 4.0 standard drinks    Types: 2 Cans of beer, 2 Standard drinks or equivalent per week    Comment: 4/ week  . Drug use: No     Colonoscopy:  Lipid panel:  No Known Allergies  Current Outpatient Medications  Medication Sig Dispense Refill  . Cholecalciferol (VITAMIN D) 2000 units CAPS Take 2,000 Units by mouth every evening.    . clopidogrel (PLAVIX) 75 MG tablet Take 75 mg by mouth daily.    . enalapril (VASOTEC) 20 MG tablet Take 10 mg by mouth 2 (two) times daily.    Marland Kitchen erythromycin ophthalmic ointment Apply to sutures 4 times a day for 10-12 days.  Discontinue if allergy develops and call our office (Patient taking differently: Apply to sutures 4 times a day for 10-12 days.  Discontinue if allergy develops and call our office) 3.5 g 2  . fenofibrate micronized (LOFIBRA) 134 MG capsule Take 134 mg by mouth daily before breakfast.     . Glucosamine HCl (GLUCOSAMINE PO) Take 1 tablet by mouth 2 (two) times daily.    . Multiple Vitamins-Minerals (PX COMPLETE SENIOR MULTIVITS) TABS Take  1 tablet by mouth.    . Omega-3 Fatty Acids (OMEGA-3 FISH OIL) 300 MG CAPS Take 300 mg by mouth 2 (two) times daily.    . pantoprazole (PROTONIX) 20 MG tablet Take 20 mg by mouth every morning.    . sildenafil (REVATIO) 20 MG tablet Take 60-100 mg by mouth daily as needed (for erectile dysfunction). Pt states 2 morning and night    . simvastatin (ZOCOR) 40 MG tablet Take 40 mg by mouth daily at 6 PM.     . tamsulosin (FLOMAX) 0.4 MG CAPS capsule Take 0.4 mg by mouth daily.    . vitamin B-12 (CYANOCOBALAMIN) 1000 MCG tablet Take 1,000 mcg by mouth daily.    . vitamin E 400 UNIT capsule Take 400 Units by mouth every evening.     . hydrochlorothiazide (HYDRODIURIL) 12.5 MG tablet Take 12.5 mg by mouth daily. (Patient not taking: Reported on 09/02/2020)    . traMADol (ULTRAM) 50 MG tablet Take 1 every 4-6 hours as needed for pain not controlled by Tylenol (Patient not taking: Reported on 09/02/2020) 6 tablet 0   No current facility-administered medications for this  visit.    OBJECTIVE: Vitals:   09/02/20 1312  BP: (!) 169/59  Pulse: 71  Resp: 18  Temp: 97.8 F (36.6 C)  SpO2: 100%     Body mass index is 26.11 kg/m.    ECOG FS:0 - Asymptomatic  General: Well-developed, well-nourished, no acute distress. Eyes: Pink conjunctiva, anicteric sclera. Lungs: Clear to auscultation bilaterally.  No audible wheezing or coughing Heart: Regular rate and rhythm.  Abdomen: Soft, nontender, nondistended.  Musculoskeletal: No edema, cyanosis, or clubbing. Neuro: Alert, answering all questions appropriately. Cranial nerves grossly intact. Skin: No rashes or petechiae noted. Psych: Normal affect.  LAB RESULTS:  Lab Results  Component Value Date   NA 137 12/30/2017   K 4.0 12/30/2017   CL 105 12/30/2017   CO2 24 12/30/2017   GLUCOSE 89 12/30/2017   BUN 13 05/06/2020   CREATININE 1.00 05/06/2020   CALCIUM 9.4 12/30/2017   PROT 7.5 06/16/2016   ALBUMIN 4.4 06/16/2016   AST 26 06/16/2016   ALT  16 (L) 06/16/2016   ALKPHOS 39 06/16/2016   BILITOT 0.6 06/16/2016   GFRNONAA >60 05/06/2020   GFRAA >60 05/06/2020    Lab Results  Component Value Date   WBC 6.1 08/29/2020   NEUTROABS 3.8 08/29/2020   HGB 8.1 (L) 08/29/2020   HCT 26.4 (L) 08/29/2020   MCV 66.7 (L) 08/29/2020   PLT 325 08/29/2020   Lab Results  Component Value Date   IRON 9 (L) 08/29/2020   TIBC 512 (H) 08/29/2020   IRONPCTSAT 2 (L) 08/29/2020   Lab Results  Component Value Date   FERRITIN 6 (L) 08/29/2020     STUDIES: VAS [place] ABI WITH/WO TBI  Result Date: 08/28/2020 LOWER EXTREMITY DOPPLER STUDY  Vascular Interventions: 05/06/2020. Comparison Study: 05/28/2020 Performing Technologist: Charlane Ferretti RT (R)(VS)  Examination Guidelines: A complete evaluation includes at minimum, Doppler waveform signals and systolic blood pressure reading at the level of bilateral brachial, anterior tibial, and posterior tibial arteries, when vessel segments are accessible. Bilateral testing is considered an integral part of a complete examination. Photoelectric Plethysmograph (PPG) waveforms and toe systolic pressure readings are included as required and additional duplex testing as needed. Limited examinations for reoccurring indications may be performed as noted.  ABI Findings: +---------+------------------+-----+---------+--------+ Right    Rt Pressure (mmHg)IndexWaveform Comment  +---------+------------------+-----+---------+--------+ Brachial 147                                      +---------+------------------+-----+---------+--------+ ATA      131               0.89 triphasic         +---------+------------------+-----+---------+--------+ PTA      131               0.89 triphasic         +---------+------------------+-----+---------+--------+ Great Toe73                0.50 Dampened          +---------+------------------+-----+---------+--------+  +---------+------------------+-----+--------+-------+ Left     Lt Pressure (mmHg)IndexWaveformComment +---------+------------------+-----+--------+-------+ Brachial 141                                    +---------+------------------+-----+--------+-------+ ATA      113  0.77 biphasic        +---------+------------------+-----+--------+-------+ PTA      116               0.79 biphasic        +---------+------------------+-----+--------+-------+ Great Toe65                0.44 Dampened        +---------+------------------+-----+--------+-------+ +-------+-----------+-----------+------------+------------+ ABI/TBIToday's ABIToday's TBIPrevious ABIPrevious TBI +-------+-----------+-----------+------------+------------+ Right  .89        .50        1.02        .80          +-------+-----------+-----------+------------+------------+ Left   .79        .44        1.06        .61          +-------+-----------+-----------+------------+------------+ Bilateral ABIs appear decreased compared to prior study on 05/28/2020. Bilateral TBIs appear decreased compared to prior study on 05/28/2020.  Summary: Right: Resting right ankle-brachial index indicates mild right lower extremity arterial disease. The right toe-brachial index is abnormal. Left: Resting left ankle-brachial index indicates moderate left lower extremity arterial disease. The left toe-brachial index is abnormal. *See table(s) above for measurements and observations.  Electronically signed by Hortencia Pilar MD on 08/28/2020 at 4:39:13 PM.   Final     ASSESSMENT: Iron deficiency anemia.  PLAN:   1.  Iron deficiency anemia: Hemoglobin improved from a month ago however decreased from previous visits.  Microcytic.  Iron stores are decreased. Previous colonoscopy and upper endoscopy 09/04/2018 did not reveal contributing pathology.  Capsule study was recommended but previously canceled due to [place] pandemic.   Recommend IV feraheme 510 mg x 2.  Recommend he follow-up with GI for reevaluation and consideration of capsule study.  Return to clinic in 3 months with repeat lab work, further evaluation, and continuation of treatment if needed.  I spent a total of 27 minutes reviewing chart data, face-to-face evaluation with the patient, counseling and coordination of care as detailed above.  Patient expressed understanding and was in agreement with this plan. He also understands that He can call clinic at any time with any questions, concerns, or complaints.   Verlon Au, NP   09/02/2020 3:27 PM

## 2020-09-02 NOTE — Progress Notes (Signed)
Pt tolerated feraheme infusion well with no complaints or problems noted.  Pt left infusion suite stable and ambulatory.

## 2020-09-02 NOTE — Patient Instructions (Signed)
Please call for an appointment at [facility] with Herbert Spires, NP at [phone]. Please let me know if you have any questions or concerns in the meantime. It was a pleasure meeting you today and thank you for allowing me to participate in your care. -Beckey Rutter, NP

## 2020-09-09 ENCOUNTER — Inpatient Hospital Stay: Payer: PPO

## 2020-09-09 ENCOUNTER — Other Ambulatory Visit: Payer: Self-pay

## 2020-09-09 VITALS — BP 150/48 | HR 63 | Temp 97.7°F | Resp 20

## 2020-09-09 DIAGNOSIS — D509 Iron deficiency anemia, unspecified: Secondary | ICD-10-CM | POA: Diagnosis not present

## 2020-09-09 MED ORDER — FERUMOXYTOL INJECTION 510 MG/17 ML
510.0000 mg | Freq: Once | INTRAVENOUS | Status: DC
  Filled 2020-09-09: qty 17

## 2020-09-09 MED ORDER — SODIUM CHLORIDE 0.9 % IV SOLN
INTRAVENOUS | Status: DC
  Filled 2020-09-09: qty 250

## 2020-09-09 MED ORDER — SODIUM CHLORIDE 0.9 % IV SOLN: 510.0000 mg | Freq: Once | INTRAVENOUS | Status: DC

## 2020-09-09 MED ORDER — SODIUM CHLORIDE 0.9 % IV SOLN
510.0000 mg | Freq: Once | INTRAVENOUS | Status: AC
  Administered 2020-09-09: 510 mg via INTRAVENOUS
  Filled 2020-09-09: qty 510

## 2020-09-09 NOTE — Progress Notes (Signed)
Stable at discharge 

## 2020-09-11 ENCOUNTER — Encounter: Payer: Self-pay | Admitting: Urology

## 2020-09-11 ENCOUNTER — Ambulatory Visit: Payer: PPO | Admitting: Urology

## 2020-09-11 ENCOUNTER — Ambulatory Visit: Payer: Self-pay | Admitting: Urology

## 2020-09-11 ENCOUNTER — Other Ambulatory Visit: Payer: Self-pay

## 2020-09-11 VITALS — BP 112/56 | HR 76 | Ht 70.0 in | Wt 176.3 lb

## 2020-09-11 DIAGNOSIS — N390 Urinary tract infection, site not specified: Secondary | ICD-10-CM

## 2020-09-11 DIAGNOSIS — N401 Enlarged prostate with lower urinary tract symptoms: Secondary | ICD-10-CM | POA: Diagnosis not present

## 2020-09-11 DIAGNOSIS — N138 Other obstructive and reflux uropathy: Secondary | ICD-10-CM | POA: Diagnosis not present

## 2020-09-11 LAB — BLADDER SCAN AMB NON-IMAGING

## 2020-09-11 MED ORDER — TAMSULOSIN HCL 0.4 MG PO CAPS: 0.4000 mg | ORAL_CAPSULE | Freq: Every day | ORAL | 3 refills | Status: DC

## 2020-09-11 NOTE — Patient Instructions (Signed)

## 2020-09-11 NOTE — Progress Notes (Signed)
   09/11/2020 12:12 PM   Ivan Reyes 1946-06-22 [id number]  Reason for visit: Follow up BPH, history of UTI/gross hematuria  HPI: I saw Ivan Reyes back in urology clinic today for follow-up of the above issues.  He is a 75 year old comorbid male with extensive CAD and PVD on Plavix who I originally saw in the fall 2020 with gross hematuria and passing pieces of pale tissue.  He had a UTI on culture and his symptoms improved with antibiotics.  With his extensive smoking history and passage of tissue he underwent a work-up with a CT urogram and cystoscopy.  CT was benign and showed a 35 g prostate, and cystoscopy showed moderate bladder trabeculations and a small diverticula, but no tumors or lesions.  He was started on Flomax for symptoms of weak stream and frequency.    At our original follow-up, he was doing well on the Flomax.  For unclear reasons, it sounds like he discontinued the Flomax at some point in the last year.  His primary urinary issues are urinary frequency during the day and weak stream.  He denies any nocturia.  Has not had any further UTIs or gross hematuria.PVR in clinic today normal at  39 mL.  I recommended resuming the Flomax, as he had some significant improvement in his urinary symptoms with this previously.  We also briefly discussed UroLift as an option in the future if he were to develop worsening urinary symptoms or recurrent UTIs.  Flomax 0.4 mg nightly RTC 3 months symptom check with IPSS and PVR   Billey Co, MD  [facility] [address] [place], [place] [address] [phone]

## 2020-10-10 DIAGNOSIS — I10 Essential (primary) hypertension: Secondary | ICD-10-CM | POA: Diagnosis not present

## 2020-10-10 DIAGNOSIS — Z720 Tobacco use: Secondary | ICD-10-CM | POA: Diagnosis not present

## 2020-10-10 DIAGNOSIS — G4733 Obstructive sleep apnea (adult) (pediatric): Secondary | ICD-10-CM | POA: Diagnosis not present

## 2020-10-10 DIAGNOSIS — E782 Mixed hyperlipidemia: Secondary | ICD-10-CM | POA: Diagnosis not present

## 2020-10-10 DIAGNOSIS — Z125 Encounter for screening for malignant neoplasm of prostate: Secondary | ICD-10-CM | POA: Diagnosis not present

## 2020-10-10 DIAGNOSIS — D509 Iron deficiency anemia, unspecified: Secondary | ICD-10-CM | POA: Diagnosis not present

## 2020-10-10 DIAGNOSIS — I70219 Atherosclerosis of native arteries of extremities with intermittent claudication, unspecified extremity: Secondary | ICD-10-CM | POA: Diagnosis not present

## 2020-10-10 DIAGNOSIS — I2510 Atherosclerotic heart disease of native coronary artery without angina pectoris: Secondary | ICD-10-CM | POA: Diagnosis not present

## 2020-11-03 DIAGNOSIS — B9689 Other specified bacterial agents as the cause of diseases classified elsewhere: Secondary | ICD-10-CM | POA: Diagnosis not present

## 2020-11-03 DIAGNOSIS — J019 Acute sinusitis, unspecified: Secondary | ICD-10-CM | POA: Diagnosis not present

## 2020-11-11 DIAGNOSIS — F17210 Nicotine dependence, cigarettes, uncomplicated: Secondary | ICD-10-CM | POA: Diagnosis not present

## 2020-11-11 DIAGNOSIS — H02835 Dermatochalasis of left lower eyelid: Secondary | ICD-10-CM | POA: Diagnosis not present

## 2020-11-11 DIAGNOSIS — Z7902 Long term (current) use of antithrombotics/antiplatelets: Secondary | ICD-10-CM | POA: Diagnosis not present

## 2020-11-11 DIAGNOSIS — G4730 Sleep apnea, unspecified: Secondary | ICD-10-CM | POA: Diagnosis not present

## 2020-11-11 DIAGNOSIS — H02132 Senile ectropion of right lower eyelid: Secondary | ICD-10-CM | POA: Diagnosis not present

## 2020-11-11 DIAGNOSIS — H02135 Senile ectropion of left lower eyelid: Secondary | ICD-10-CM | POA: Diagnosis not present

## 2020-11-11 DIAGNOSIS — H02832 Dermatochalasis of right lower eyelid: Secondary | ICD-10-CM | POA: Diagnosis not present

## 2020-11-11 DIAGNOSIS — I10 Essential (primary) hypertension: Secondary | ICD-10-CM | POA: Diagnosis not present

## 2020-11-11 DIAGNOSIS — Z79899 Other long term (current) drug therapy: Secondary | ICD-10-CM | POA: Diagnosis not present

## 2020-11-24 ENCOUNTER — Ambulatory Visit (INDEPENDENT_AMBULATORY_CARE_PROVIDER_SITE_OTHER): Payer: PPO | Admitting: Vascular Surgery

## 2020-11-24 ENCOUNTER — Ambulatory Visit (INDEPENDENT_AMBULATORY_CARE_PROVIDER_SITE_OTHER): Payer: PPO

## 2020-11-24 ENCOUNTER — Other Ambulatory Visit: Payer: Self-pay

## 2020-11-24 ENCOUNTER — Encounter (INDEPENDENT_AMBULATORY_CARE_PROVIDER_SITE_OTHER): Payer: Self-pay | Admitting: Vascular Surgery

## 2020-11-24 VITALS — BP 137/69 | HR 71 | Ht 68.0 in | Wt 170.0 lb

## 2020-11-24 DIAGNOSIS — I70213 Atherosclerosis of native arteries of extremities with intermittent claudication, bilateral legs: Secondary | ICD-10-CM | POA: Diagnosis not present

## 2020-11-24 DIAGNOSIS — E782 Mixed hyperlipidemia: Secondary | ICD-10-CM

## 2020-11-24 DIAGNOSIS — I6523 Occlusion and stenosis of bilateral carotid arteries: Secondary | ICD-10-CM | POA: Diagnosis not present

## 2020-11-24 DIAGNOSIS — I25118 Atherosclerotic heart disease of native coronary artery with other forms of angina pectoris: Secondary | ICD-10-CM

## 2020-11-24 DIAGNOSIS — I10 Essential (primary) hypertension: Secondary | ICD-10-CM | POA: Diagnosis not present

## 2020-11-24 NOTE — Progress Notes (Signed)
MRN : [id number]  Ivan Reyes is a 75 y.o. (06/12/1946) male who presents with chief complaint of No chief complaint on file. Marland Kitchen  History of Present Illness:   The patient returns to the office for followup and review status post angiogram with intervention on 05/06/2020.  Procedure: Crosser atherectomy of theleftSFA and popliteal arteries with percutaneous transluminal angioplasty and stent placementleftsuperficial femoral artery and poplitealto 5 mm and percutaneous transluminal angioplasty of theleft tibial peroneal trunk to 4 mm with a Lutonix drug-eluting balloon   The patient notes improvement in the lower extremity symptoms. No interval shortening of the patient's claudication distance or rest pain symptoms. Previous wounds have now healed.  No new ulcers or wounds have occurred since the last visit.  There have been no significant changes to the patient's overall health care.  The patient denies amaurosis fugax or recent TIA symptoms. There are no recent neurological changes noted. The patient denies history of DVT, PE or superficial thrombophlebitis. The patient denies recent episodes of angina or shortness of breath.   ABI's Rt=0.97 and Lt=0.70  (previous ABI's Rt=0.89 and Lt=0.79) Duplex ultrasound of the left lower extremity demonstrates a patent SFA stent.  There is moderate restenosis at the leading edge of the stent with velocities of 183 cm/s.  No outpatient medications have been marked as taking for the 11/24/20 encounter (Appointment) with Delana Meyer, Dolores Lory, MD.    Past Medical History:  Diagnosis Date  . Arthritis   . Coronary artery disease   . GERD (gastroesophageal reflux disease)   . Hyperlipidemia   . Hypertension   . Mitral valve disorder   . Peripheral vascular disease ([place])   . Sleep apnea    use C-PAP    Past Surgical History:  Procedure Laterality Date  . BROW LIFT Bilateral 07/24/2020   Procedure:  BLEPHAROPLASTY UPPER EYELID; W/EXCESS SKIN BILATERAL;  Surgeon: Karle Starch, MD;  Location: [place];  Service: Ophthalmology;  Laterality: Bilateral;  sleep apnea  . CAROTID ENDARTERECTOMY Bilateral    Dr. Delana Meyer, [facility]  . CARPAL TUNNEL RELEASE Left 12/23/2016   Procedure: CARPAL TUNNEL RELEASE ENDOSCOPIC;  Surgeon: Corky Mull, MD;  Location: [facility];  Service: Orthopedics;  Laterality: Left;  . CARPAL TUNNEL RELEASE Right 01/13/2017   Procedure: CARPAL TUNNEL RELEASE ENDOSCOPIC;  Surgeon: Corky Mull, MD;  Location: [facility];  Service: Orthopedics;  Laterality: Right;  . COLONOSCOPY WITH PROPOFOL N/A 09/04/2018   Procedure: COLONOSCOPY WITH PROPOFOL;  Surgeon: Manya Silvas, MD;  Location: [facility] ENDOSCOPY;  Service: Endoscopy;  Laterality: N/A;  . CORONARY ANGIOPLASTY     3 stents  . coronary atherosclerosis of  autologous graft    . ESOPHAGOGASTRODUODENOSCOPY (EGD) WITH PROPOFOL N/A 12/12/2017   Procedure: ESOPHAGOGASTRODUODENOSCOPY (EGD) WITH PROPOFOL;  Surgeon: Manya Silvas, MD;  Location: [facility] ENDOSCOPY;  Service: Endoscopy;  Laterality: N/A;  . ESOPHAGOGASTRODUODENOSCOPY (EGD) WITH PROPOFOL N/A 09/04/2018   Procedure: ESOPHAGOGASTRODUODENOSCOPY (EGD) WITH PROPOFOL;  Surgeon: Manya Silvas, MD;  Location: [facility] ENDOSCOPY;  Service: Endoscopy;  Laterality: N/A;  . EYE SURGERY Bilateral    Cataract Extraction with IOL  . FRACTURE SURGERY Right    Hip Pinning, Dr. Franchot Mimes, Jr  . JOINT REPLACEMENT     left knee  . KNEE ARTHROPLASTY Left 06/30/2016   Procedure: COMPUTER ASSISTED TOTAL KNEE ARTHROPLASTY;  Surgeon: Dereck Leep, MD;  Location: [facility];  Service: Orthopedics;  Laterality: Left;  . LOWER EXTREMITY ANGIOGRAPHY Left 05/06/2020  Procedure: LOWER EXTREMITY ANGIOGRAPHY;  Surgeon: Katha Cabal, MD;  Location: [place] CV LAB;  Service: Cardiovascular;  Laterality: Left;  Marland Kitchen MASS EXCISION Left 01/11/2018   Procedure: EXCISION CYSTIC MASS  ANTERIOR KNEE;  Surgeon: Dereck Leep, MD;  Location: [facility];  Service: Orthopedics;  Laterality: Left;    Social History Social History   Tobacco Use  . Smoking status: Current Every Day Smoker    Packs/day: 0.50    Years: 60.00    Pack years: 30.00    Types: Cigarettes  . Smokeless tobacco: Never Used  . Tobacco comment: since age 7  Vaping Use  . Vaping Use: Never used  Substance Use Topics  . Alcohol use: Yes    Alcohol/week: 4.0 standard drinks    Types: 2 Cans of beer, 2 Standard drinks or equivalent per week    Comment: 4/ week  . Drug use: No    Family History No family history on file.  No Known Allergies   REVIEW OF SYSTEMS (Negative unless checked)  Constitutional: [] Weight loss  [] Fever  [] Chills Cardiac: [] Chest pain   [] Chest pressure   [] Palpitations   [] Shortness of breath when laying flat   [] Shortness of breath with exertion. Vascular:  [] Pain in legs with walking   [x] Pain in legs at rest  [] History of DVT   [] Phlebitis   [] Swelling in legs   [] Varicose veins   [] Non-healing ulcers Pulmonary:   [] Uses home oxygen   [] Productive cough   [] Hemoptysis   [] Wheeze  [] COPD   [] Asthma Neurologic:  [] Dizziness   [] Seizures   [] History of stroke   [] History of TIA  [] Aphasia   [] Vissual changes   [] Weakness or numbness in arm   [] Weakness or numbness in leg Musculoskeletal:   [] Joint swelling   [x] Joint pain   [x] Low back pain Hematologic:  [] Easy bruising  [] Easy bleeding   [] Hypercoagulable state   [] Anemic Gastrointestinal:  [] Diarrhea   [] Vomiting  [] Gastroesophageal reflux/heartburn   [] Difficulty swallowing. Genitourinary:  [] Chronic kidney disease   [] Difficult urination  [] Frequent urination   [] Blood in urine Skin:  [] Rashes   [] Ulcers  Psychological:  [] History of anxiety   []  History of major depression.  Physical Examination  There were no vitals filed for this visit. There is no height or weight on file to calculate BMI. Gen: WD/WN,  NAD Head: [place]/AT, No temporalis wasting.  Ear/Nose/Throat: Hearing grossly intact, nares w/o erythema or drainage Eyes: PER, EOMI, sclera nonicteric.  Neck: Supple, no large masses.   Pulmonary:  Good air movement, no audible wheezing bilaterally, no use of accessory muscles.  Cardiac: RRR, no JVD Vascular:  Vessel Right Left  Radial Palpable Palpable  PT Palpable Trace Palpable  DP Not Palpable Not Palpable  Gastrointestinal: Non-distended. No guarding/no peritoneal signs.  Musculoskeletal: M/S 5/5 throughout.  No deformity or atrophy.  Neurologic: CN 2-12 intact. Symmetrical.  Speech is fluent. Motor exam as listed above. Psychiatric: Judgment intact, Mood & affect appropriate for pt's clinical situation.   CBC Lab Results  Component Value Date   WBC 6.1 08/29/2020   HGB 8.1 (L) 08/29/2020   HCT 26.4 (L) 08/29/2020   MCV 66.7 (L) 08/29/2020   PLT 325 08/29/2020    BMET    Component Value Date/Time   NA 137 12/30/2017 1150   K 4.0 12/30/2017 1150   K 4.1 06/28/2014 1603   CL 105 12/30/2017 1150   CO2 24 12/30/2017 1150   GLUCOSE 89 12/30/2017 1150  BUN 13 05/06/2020 1137   CREATININE 1.00 05/06/2020 1137   CALCIUM 9.4 12/30/2017 1150   GFRNONAA >60 05/06/2020 1137   GFRAA >60 05/06/2020 1137   CrCl cannot be calculated (Patient's most recent lab result is older than the maximum 21 days allowed.).  COAG Lab Results  Component Value Date   INR 0.92 06/16/2016    Radiology No results found.   Assessment/Plan 1. Atherosclerosis of native artery of both lower extremities with intermittent claudication (HCC) Recommend:  The patient is status post successful angiogram with intervention.  The patient reports that the claudication symptoms and leg pain is essentially gone.   The patient denies lifestyle limiting changes at this point in time.  No further invasive studies, angiography or surgery at this time The patient should continue walking and begin a more  formal exercise program.  The patient should continue antiplatelet therapy and aggressive treatment of the lipid abnormalities  Smoking cessation was again discussed  The patient should continue wearing graduated compression socks 10-15 mmHg strength to control the mild edema.  Patient should undergo noninvasive studies as ordered. The patient will follow up with me after the studies.   - VAS [place] ABI WITH/WO TBI; Future - VAS [place] LOWER EXTREMITY ARTERIAL DUPLEX; Future  2. Bilateral carotid artery stenosis Recommend:  Given the patient's asymptomatic subcritical stenosis no further invasive testing or surgery at this time.  Continue antiplatelet therapy as prescribed Continue management of CAD, HTN and Hyperlipidemia Healthy heart diet,  encouraged exercise at least 4 times per week Follow up in 6 months with duplex ultrasound and physical exam   3. Primary hypertension Continue antihypertensive medications as already ordered, these medications have been reviewed and there are no changes at this time.   4. Coronary artery disease of native artery of native heart with stable angina pectoris (HCC) Continue cardiac and antihypertensive medications as already ordered and reviewed, no changes at this time.  Continue statin as ordered and reviewed, no changes at this time  Nitrates PRN for chest pain   5. Mixed hyperlipidemia Continue statin as ordered and reviewed, no changes at this time    Hortencia Pilar, MD  11/24/2020 12:45 PM

## 2020-11-28 ENCOUNTER — Inpatient Hospital Stay: Payer: PPO | Attending: Oncology

## 2020-11-28 ENCOUNTER — Other Ambulatory Visit: Payer: Self-pay

## 2020-11-28 DIAGNOSIS — F17210 Nicotine dependence, cigarettes, uncomplicated: Secondary | ICD-10-CM | POA: Diagnosis not present

## 2020-11-28 DIAGNOSIS — G4730 Sleep apnea, unspecified: Secondary | ICD-10-CM | POA: Insufficient documentation

## 2020-11-28 DIAGNOSIS — I10 Essential (primary) hypertension: Secondary | ICD-10-CM | POA: Diagnosis not present

## 2020-11-28 DIAGNOSIS — I2510 Atherosclerotic heart disease of native coronary artery without angina pectoris: Secondary | ICD-10-CM | POA: Diagnosis not present

## 2020-11-28 DIAGNOSIS — N400 Enlarged prostate without lower urinary tract symptoms: Secondary | ICD-10-CM | POA: Diagnosis not present

## 2020-11-28 DIAGNOSIS — Z79899 Other long term (current) drug therapy: Secondary | ICD-10-CM | POA: Insufficient documentation

## 2020-11-28 DIAGNOSIS — D509 Iron deficiency anemia, unspecified: Secondary | ICD-10-CM | POA: Diagnosis not present

## 2020-11-28 LAB — CBC WITH DIFFERENTIAL/PLATELET
Abs Immature Granulocytes: 0.02 10*3/uL (ref 0.00–0.07)
Basophils Absolute: 0.1 10*3/uL (ref 0.0–0.1)
Basophils Relative: 1 %
Eosinophils Absolute: 0.2 10*3/uL (ref 0.0–0.5)
Eosinophils Relative: 3 %
HCT: 36.5 % — ABNORMAL LOW (ref 39.0–52.0)
Hemoglobin: 12.2 g/dL — ABNORMAL LOW (ref 13.0–17.0)
Immature Granulocytes: 0 %
Lymphocytes Relative: 21 %
Lymphs Abs: 1.5 10*3/uL (ref 0.7–4.0)
MCH: 28.4 pg (ref 26.0–34.0)
MCHC: 33.4 g/dL (ref 30.0–36.0)
MCV: 85.1 fL (ref 80.0–100.0)
Monocytes Absolute: 0.5 10*3/uL (ref 0.1–1.0)
Monocytes Relative: 7 %
Neutro Abs: 5 10*3/uL (ref 1.7–7.7)
Neutrophils Relative %: 68 %
Platelets: 201 10*3/uL (ref 150–400)
RBC: 4.29 MIL/uL (ref 4.22–5.81)
RDW: 19.5 % — ABNORMAL HIGH (ref 11.5–15.5)
WBC: 7.3 10*3/uL (ref 4.0–10.5)
nRBC: 0 % (ref 0.0–0.2)

## 2020-11-28 LAB — IRON AND TIBC
Iron: 32 ug/dL — ABNORMAL LOW (ref 45–182)
Saturation Ratios: 8 % — ABNORMAL LOW (ref 17.9–39.5)
TIBC: 393 ug/dL (ref 250–450)
UIBC: 361 ug/dL

## 2020-11-28 LAB — FERRITIN: Ferritin: 10 ng/mL — ABNORMAL LOW (ref 24–336)

## 2020-12-01 ENCOUNTER — Other Ambulatory Visit: Payer: Self-pay

## 2020-12-01 ENCOUNTER — Inpatient Hospital Stay (HOSPITAL_BASED_OUTPATIENT_CLINIC_OR_DEPARTMENT_OTHER): Payer: PPO | Admitting: Oncology

## 2020-12-01 ENCOUNTER — Inpatient Hospital Stay: Payer: PPO

## 2020-12-01 ENCOUNTER — Encounter: Payer: Self-pay | Admitting: Oncology

## 2020-12-01 VITALS — BP 162/74 | HR 66 | Temp 97.1°F | Resp 18

## 2020-12-01 DIAGNOSIS — D509 Iron deficiency anemia, unspecified: Secondary | ICD-10-CM

## 2020-12-01 MED ORDER — SODIUM CHLORIDE 0.9 % IV SOLN
Freq: Once | INTRAVENOUS | Status: AC
Start: 2020-12-01 — End: 2020-12-01
  Filled 2020-12-01: qty 250

## 2020-12-01 MED ORDER — SODIUM CHLORIDE 0.9 % IV SOLN
510.0000 mg | Freq: Once | INTRAVENOUS | Status: AC
  Administered 2020-12-01: 510 mg via INTRAVENOUS
  Filled 2020-12-01: qty 510

## 2020-12-01 NOTE — Progress Notes (Signed)
[name]  [phone] [phone]  ID: Ivan Reyes OB: 23-Dec-1945  MR#: [id number]  UXN#:[id number]  Patient Care Team: Baxter Hire, MD as PCP - General (Internal Medicine) Lloyd Huger, MD as Consulting Physician (Hematology and Oncology)  CHIEF COMPLAINT: Iron deficiency anemia.  INTERVAL HISTORY: Patient returns to clinic today for discussion of lab results and evaluation and continuation of IV Feraheme.  He was last seen in clinic on 09/02/2020.  He last received IV Feraheme on 09/02/2020 and 09/09/2020.  In the interim, he was seen by urology for follow-up for BPH, chronic UTI and hematuria.  Symptoms appeared stable.  He self stopped Flomax.  He was restarted on Flomax 0.4 mg nightly.  No recurrent UTIs.  He was seen by vein and vascular on 11/24/2020 for follow-up for angiogram w/ intervention that took place on 05/06/20 due to left leg pain.  Symptoms have resolved.  In the interim, he has done well.  Energy level has improved since his iron treatments.  He has noticed that he is not as fatigued in the evenings.  Denies any melena or hematochezia.  Endorses easy bruising on his arms and legs but denies bleeding.  Has not had his follow-up appointment for capsule study due to Ivan Reyes.  It was initially scheduled for 2021.   REVIEW OF SYSTEMS:   Review of Systems  Constitutional: Positive for malaise/fatigue. Negative for fever and weight loss.  Respiratory: Negative.  Negative for cough and shortness of breath.   Cardiovascular: Negative.  Negative for chest pain and leg swelling.  Gastrointestinal: Negative.  Negative for abdominal pain, blood in stool and melena.  Genitourinary: Negative.  Negative for dysuria and hematuria.  Musculoskeletal: Negative.  Negative for back pain.  Skin: Negative.  Negative for rash.  Neurological: Negative for dizziness, focal weakness, weakness and headaches.  Psychiatric/Behavioral:  Negative.  The patient is not nervous/anxious.   As per HPI. Otherwise, a complete review of systems is negative.  PAST MEDICAL HISTORY: Past Medical History:  Diagnosis Date  . Arthritis   . Coronary artery disease   . GERD (gastroesophageal reflux disease)   . Hyperlipidemia   . Hypertension   . Mitral valve disorder   . Peripheral vascular disease ([place])   . Sleep apnea    use C-PAP    PAST SURGICAL HISTORY: Past Surgical History:  Procedure Laterality Date  . BROW LIFT Bilateral 07/24/2020   Procedure: BLEPHAROPLASTY UPPER EYELID; W/EXCESS SKIN BILATERAL;  Surgeon: Karle Starch, MD;  Location: [facility];  Service: Ophthalmology;  Laterality: Bilateral;  sleep apnea  . CAROTID ENDARTERECTOMY Bilateral    Dr. Delana Meyer, [facility]  . CARPAL TUNNEL RELEASE Left 12/23/2016   Procedure: CARPAL TUNNEL RELEASE ENDOSCOPIC;  Surgeon: Corky Mull, MD;  Location: [facility];  Service: Orthopedics;  Laterality: Left;  . CARPAL TUNNEL RELEASE Right 01/13/2017   Procedure: CARPAL TUNNEL RELEASE ENDOSCOPIC;  Surgeon: Corky Mull, MD;  Location: [facility];  Service: Orthopedics;  Laterality: Right;  . COLONOSCOPY WITH PROPOFOL N/A 09/04/2018   Procedure: COLONOSCOPY WITH PROPOFOL;  Surgeon: Manya Silvas, MD;  Location: [facility] ENDOSCOPY;  Service: Endoscopy;  Laterality: N/A;  . CORONARY ANGIOPLASTY     3 stents  . coronary atherosclerosis of  autologous graft    . ESOPHAGOGASTRODUODENOSCOPY (EGD) WITH PROPOFOL N/A 12/12/2017   Procedure: ESOPHAGOGASTRODUODENOSCOPY (EGD) WITH PROPOFOL;  Surgeon: Manya Silvas, MD;  Location: [facility] ENDOSCOPY;  Service: Endoscopy;  Laterality: N/A;  . ESOPHAGOGASTRODUODENOSCOPY (  EGD) WITH PROPOFOL N/A 09/04/2018   Procedure: ESOPHAGOGASTRODUODENOSCOPY (EGD) WITH PROPOFOL;  Surgeon: Manya Silvas, MD;  Location: [facility] ENDOSCOPY;  Service: Endoscopy;  Laterality: N/A;  . EYE SURGERY Bilateral    Cataract Extraction with IOL  . FRACTURE SURGERY Right     Hip Pinning, Dr. Franchot Mimes, Jr  . JOINT REPLACEMENT     left knee  . KNEE ARTHROPLASTY Left 06/30/2016   Procedure: COMPUTER ASSISTED TOTAL KNEE ARTHROPLASTY;  Surgeon: Dereck Leep, MD;  Location: [facility];  Service: Orthopedics;  Laterality: Left;  . LOWER EXTREMITY ANGIOGRAPHY Left 05/06/2020   Procedure: LOWER EXTREMITY ANGIOGRAPHY;  Surgeon: Katha Cabal, MD;  Location: [place] CV LAB;  Service: Cardiovascular;  Laterality: Left;  Marland Kitchen MASS EXCISION Left 01/11/2018   Procedure: EXCISION CYSTIC MASS ANTERIOR KNEE;  Surgeon: Dereck Leep, MD;  Location: [facility];  Service: Orthopedics;  Laterality: Left;    FAMILY HISTORY: No family history on file.  ADVANCED DIRECTIVES (Y/N):  N  HEALTH MAINTENANCE: Social History   Tobacco Use  . Smoking status: Current Every Day Smoker    Packs/day: 0.50    Years: 60.00    Pack years: 30.00    Types: Cigarettes  . Smokeless tobacco: Never Used  . Tobacco comment: since age 86  Vaping Use  . Vaping Use: Never used  Substance Use Topics  . Alcohol use: Yes    Alcohol/week: 4.0 standard drinks    Types: 2 Cans of beer, 2 Standard drinks or equivalent per week    Comment: 4/ week  . Drug use: No     Colonoscopy:  Lipid panel:  No Known Allergies  Current Outpatient Medications  Medication Sig Dispense Refill  . Cholecalciferol (VITAMIN D) 2000 units CAPS Take 2,000 Units by mouth every evening.    . clopidogrel (PLAVIX) 75 MG tablet Take 75 mg by mouth daily.    . enalapril (VASOTEC) 20 MG tablet Take 10 mg by mouth 2 (two) times daily.    . fenofibrate micronized (LOFIBRA) 134 MG capsule Take 134 mg by mouth daily before breakfast.     . Glucosamine HCl (GLUCOSAMINE PO) Take 1 tablet by mouth 2 (two) times daily.    . Multiple Vitamins-Minerals (PX COMPLETE SENIOR MULTIVITS) TABS Take 1 tablet by mouth.    . Omega-3 Fatty Acids (OMEGA-3 FISH OIL) 300 MG CAPS Take 300 mg by mouth 2 (two) times daily.    .  pantoprazole (PROTONIX) 20 MG tablet Take 20 mg by mouth every morning.    . sildenafil (REVATIO) 20 MG tablet Take 60-100 mg by mouth daily as needed (for erectile dysfunction). Pt states 2 morning and night    . simvastatin (ZOCOR) 40 MG tablet Take 40 mg by mouth daily at 6 PM.     . tamsulosin (FLOMAX) 0.4 MG CAPS capsule Take 1 capsule (0.4 mg total) by mouth daily. 90 capsule 3  . vitamin B-12 (CYANOCOBALAMIN) 1000 MCG tablet Take 1,000 mcg by mouth daily.    . vitamin E 400 UNIT capsule Take 400 Units by mouth every evening.      No current facility-administered medications for this visit.    OBJECTIVE: There were no vitals filed for this visit.   There is no height or weight on file to calculate BMI.    ECOG FS:0 - Asymptomatic  Physical Exam Constitutional:      Appearance: Normal appearance.  HENT:     Head: Normocephalic and atraumatic.  Eyes:  Pupils: Pupils are equal, round, and reactive to light.  Cardiovascular:     Rate and Rhythm: Normal rate and regular rhythm.     Heart sounds: Normal heart sounds. No murmur heard.   Pulmonary:     Effort: Pulmonary effort is normal.     Breath sounds: Normal breath sounds. No wheezing.  Abdominal:     General: Bowel sounds are normal. There is no distension.     Palpations: Abdomen is soft.     Tenderness: There is no abdominal tenderness.  Musculoskeletal:        General: Normal range of motion.     Cervical back: Normal range of motion.  Skin:    General: Skin is warm and dry.     Findings: No rash.  Neurological:     Mental Status: He is alert and oriented to person, place, and time.  Psychiatric:        Judgment: Judgment normal.      LAB RESULTS:  Lab Results  Component Value Date   NA 137 12/30/2017   K 4.0 12/30/2017   CL 105 12/30/2017   CO2 24 12/30/2017   GLUCOSE 89 12/30/2017   BUN 13 05/06/2020   CREATININE 1.00 05/06/2020   CALCIUM 9.4 12/30/2017   PROT 7.5 06/16/2016   ALBUMIN 4.4  06/16/2016   AST 26 06/16/2016   ALT 16 (L) 06/16/2016   ALKPHOS 39 06/16/2016   BILITOT 0.6 06/16/2016   GFRNONAA >60 05/06/2020   GFRAA >60 05/06/2020    Lab Results  Component Value Date   WBC 7.3 11/28/2020   NEUTROABS 5.0 11/28/2020   HGB 12.2 (L) 11/28/2020   HCT 36.5 (L) 11/28/2020   MCV 85.1 11/28/2020   PLT 201 11/28/2020   Lab Results  Component Value Date   IRON 32 (L) 11/28/2020   TIBC 393 11/28/2020   IRONPCTSAT 8 (L) 11/28/2020   Lab Results  Component Value Date   FERRITIN 10 (L) 11/28/2020     STUDIES: VAS [place] ABI WITH/WO TBI  Result Date: 11/24/2020 LOWER EXTREMITY DOPPLER STUDY Indications: Peripheral artery disease.  Vascular Interventions: 05/06/2020. Comparison Study: 08/28/2020 Performing Technologist: Concha Norway RVT  Examination Guidelines: A complete evaluation includes at minimum, Doppler waveform signals and systolic blood pressure reading at the level of bilateral brachial, anterior tibial, and posterior tibial arteries, when vessel segments are accessible. Bilateral testing is considered an integral part of a complete examination. Photoelectric Plethysmograph (PPG) waveforms and toe systolic pressure readings are included as required and additional duplex testing as needed. Limited examinations for reoccurring indications may be performed as noted.  ABI Findings: +---------+------------------+-----+---------+--------+ Right    Rt Pressure (mmHg)IndexWaveform Comment  +---------+------------------+-----+---------+--------+ Brachial 115                                      +---------+------------------+-----+---------+--------+ ATA      108               0.94 biphasic          +---------+------------------+-----+---------+--------+ PTA      112               0.97 triphasic         +---------+------------------+-----+---------+--------+ Great Toe67                0.58 Normal             +---------+------------------+-----+---------+--------+ +-------+-----------+-----------+------------+------------+ ABI/TBIToday's  ABIToday's TBIPrevious ABIPrevious TBI +-------+-----------+-----------+------------+------------+ Right  .97        .58        .89         .50          +-------+-----------+-----------+------------+------------+ Left   .70        .59        .79         .44          +-------+-----------+-----------+------------+------------+ Right ABIs appear increased compared to prior study on 08/28/2020. Left TBIs appear increased.  Summary: Right: Resting right ankle-brachial index is within normal range. No evidence of significant right lower extremity arterial disease. The right toe-brachial index is abnormal. Left: Resting left ankle-brachial index indicates moderate left lower extremity arterial disease. The left toe-brachial index is abnormal.  *See table(s) above for measurements and observations.  Electronically signed by Hortencia Pilar MD on 11/24/2020 at 5:02:56 PM.   Final    VAS [place] LOWER EXTREMITY ARTERIAL DUPLEX  Result Date: 11/24/2020 LOWER EXTREMITY ARTERIAL DUPLEX STUDY  Vascular Interventions: 05/06/2020 PTA & stent Lt SFA and pop. PTA Lt                         tibioperoneal trunk. Current ABI:            Left ABI = .70 Comparison Study: 05/2020 Performing Technologist: Concha Norway RVT  Examination Guidelines: A complete evaluation includes B-mode imaging, spectral Doppler, color Doppler, and power Doppler as needed of all accessible portions of each vessel. Bilateral testing is considered an integral part of a complete examination. Limited examinations for reoccurring indications may be performed as noted.   +----------+--------+-----+--------+----------+------------+ LEFT      PSV cm/sRatioStenosisWaveform  Comments     +----------+--------+-----+--------+----------+------------+ CFA Mid   115                  triphasic               +----------+--------+-----+--------+----------+------------+ DFA       72                   triphasic              +----------+--------+-----+--------+----------+------------+ SFA Prox  183                  monophasicstent origin +----------+--------+-----+--------+----------+------------+ SFA Mid   100                  monophasicstent        +----------+--------+-----+--------+----------+------------+ SFA Distal152                  monophasicstent end    +----------+--------+-----+--------+----------+------------+ POP Distal54                   monophasic             +----------+--------+-----+--------+----------+------------+ ATA Distal31                   monophasic             +----------+--------+-----+--------+----------+------------+ PTA Distal34                   monophasic             +----------+--------+-----+--------+----------+------------+  Summary: Left: Patent Left SFA stents with moderate stenosis at the origin and strong monophasic flow throughout.  See table(s) above for measurements and observations. Electronically signed by Belenda Cruise  Schnier MD on 11/24/2020 at 5:02:50 PM.    Final     ASSESSMENT: Iron deficiency anemia.  PLAN:   1.  Iron deficiency anemia: -Multifactorial- hx of hematuria and possible GI etiology. -Previously had colonoscopy and upper endoscopy on 09/04/2018 which did not reveal any acute or contributing pathology. -GI recommended capsule study which has been held secondary to Covid. -He last received IV Feraheme on 09/02/2020 and 09/09/2020. -Labs from 11/28/2020 show a hemoglobin of 12.2, ferritin 10 and iron saturations 8%. -Although his hemoglobin has improved tremendously, would recommend 2 additional doses of IV Feraheme given ferritin and iron saturations are still low. - He will return to clinic and 1 week for 1 additional IV Feraheme and in 3 months for lab work and possible IV iron.  Disposition: -Proceed with  IV Feraheme today and again in 1 week. -RTC in 3 months with repeat lab work (CBC, iron, ferritin), MD assessment and possible IV iron.  Greater than 50% was spent in counseling and coordination of care with this patient including but not limited to discussion of the relevant topics above (See A&P) including, but not limited to diagnosis and management of acute and chronic medical conditions.   Patient expressed understanding and was in agreement with this plan. He also understands that He can call clinic at any time with any questions, concerns, or complaints.   Jacquelin Hawking, NP   12/01/2020 12:38 PM

## 2020-12-01 NOTE — Progress Notes (Signed)
Patient tolerated infusion well. Patient declined 30 minute post observation. Discharged home.

## 2020-12-01 NOTE — Progress Notes (Signed)
Patient here for oncology follow-up appointment, expresses no new complaints or concerns at this time.   

## 2020-12-08 ENCOUNTER — Inpatient Hospital Stay: Payer: PPO

## 2020-12-11 ENCOUNTER — Encounter: Payer: Self-pay | Admitting: Urology

## 2020-12-11 ENCOUNTER — Other Ambulatory Visit: Payer: Self-pay

## 2020-12-11 ENCOUNTER — Ambulatory Visit: Payer: PPO | Admitting: Urology

## 2020-12-11 VITALS — BP 167/80 | HR 77 | Ht 70.0 in | Wt 170.0 lb

## 2020-12-11 DIAGNOSIS — N138 Other obstructive and reflux uropathy: Secondary | ICD-10-CM | POA: Diagnosis not present

## 2020-12-11 DIAGNOSIS — N401 Enlarged prostate with lower urinary tract symptoms: Secondary | ICD-10-CM | POA: Diagnosis not present

## 2020-12-11 DIAGNOSIS — N390 Urinary tract infection, site not specified: Secondary | ICD-10-CM | POA: Diagnosis not present

## 2020-12-11 LAB — BLADDER SCAN AMB NON-IMAGING

## 2020-12-11 MED ORDER — TAMSULOSIN HCL 0.4 MG PO CAPS: 0.4000 mg | ORAL_CAPSULE | Freq: Every day | ORAL | 11 refills | Status: DC

## 2020-12-11 NOTE — Patient Instructions (Signed)
Tamsulosin capsules What is this medicine? TAMSULOSIN (tam SOO loe sin) is an alpha blocker. It is used to treat the signs and symptoms of an enlarged prostate in men. This condition is also called benign prostatic hyperplasia (BPH). This medicine may be used for other purposes; ask your health care provider or pharmacist if you have questions. COMMON BRAND NAME(S): Flomax What should I tell my health care provider before I take this medicine? They need to know if you have any of the following conditions:  advanced kidney disease  advanced liver disease  low blood pressure  prostate cancer  an unusual or allergic reaction to tamsulosin, sulfa drugs, other medicines, foods, dyes, or preservatives  pregnant or trying to get pregnant  breast-feeding How should I use this medicine? Take this medicine by mouth about 30 minutes after the same meal every day. Follow the directions on the prescription label. Swallow the capsules whole with a glass of water. Do not crush, chew, or open capsules. Do not take your medicine more often than directed. Do not stop taking your medicine unless your doctor tells you to. Talk to your pediatrician regarding the use of this medicine in children. Special care may be needed. Overdosage: If you think you have taken too much of this medicine contact a poison control center or emergency room at once. NOTE: This medicine is only for you. Do not share this medicine with others. What if I miss a dose? If you miss a dose, take it as soon as you can. If it is almost time for your next dose, take only that dose. Do not take double or extra doses. If you stop taking your medicine for several days or more, ask your doctor or health care professional what dose you should start back on. What may interact with this medicine?  cimetidine  fluoxetine  ketoconazole  medicines for erectile disfunction like sildenafil, tadalafil, vardenafil  medicines for high blood  pressure  other alpha-blockers like alfuzosin, doxazosin, phentolamine, phenoxybenzamine, prazosin, terazosin  warfarin This list may not describe all possible interactions. Give your health care provider a list of all the medicines, herbs, non-prescription drugs, or dietary supplements you use. Also tell them if you smoke, drink alcohol, or use illegal drugs. Some items may interact with your medicine. What should I watch for while using this medicine? Visit your doctor or health care professional for regular check ups. You will need lab work done before you start this medicine and regularly while you are taking it. Check your blood pressure as directed. Ask your health care professional what your blood pressure should be, and when you should contact him or her. This medicine may make you feel dizzy or lightheaded. This is more likely to happen after the first dose, after an increase in dose, or during hot weather or exercise. Drinking alcohol and taking some medicines can make this worse. Do not drive, use machinery, or do anything that needs mental alertness until you know how this medicine affects you. Do not sit or stand up quickly. If you begin to feel dizzy, sit down until you feel better. These effects can decrease once your body adjusts to the medicine. Contact your doctor or health care professional right away if you have an erection that lasts longer than 4 hours or if it becomes painful. This may be a sign of a serious problem and must be treated right away to prevent permanent damage. If you are thinking of having cataract surgery, tell your  eye surgeon that you have taken this medicine. What side effects may I notice from receiving this medicine? Side effects that you should report to your doctor or health care professional as soon as possible:  allergic reactions like skin rash or itching, hives, swelling of the lips, mouth, tongue, or throat  breathing problems  change in  vision  feeling faint or lightheaded  irregular heartbeat  prolonged or painful erection  weakness Side effects that usually do not require medical attention (report to your doctor or health care professional if they continue or are bothersome):  back pain  change in sex drive or performance  constipation, nausea or vomiting  cough  drowsy  runny or stuffy nose  trouble sleeping This list may not describe all possible side effects. Call your doctor for medical advice about side effects. You may report side effects to FDA at 1-800-FDA-1088. Where should I keep my medicine? Keep out of the reach of children. Store at room temperature between 15 and 30 degrees C (59 and 86 degrees F). Throw away any unused medicine after the expiration date. NOTE: This sheet is a summary. It may not cover all possible information. If you have questions about this medicine, talk to your doctor, pharmacist, or health care provider.  2021 Elsevier/Gold Standard (2018-01-05 12:54:06) Benign Prostatic Hyperplasia  Benign prostatic hyperplasia (BPH) is an enlarged prostate gland that is caused by the normal aging process and not by cancer. The prostate is a walnut-sized gland that is involved in the production of semen. It is located in front of the rectum and below the bladder. The bladder stores urine and the urethra is the tube that carries the urine out of the body. The prostate may get bigger as a man gets older. An enlarged prostate can press on the urethra. This can make it harder to pass urine. The build-up of urine in the bladder can cause infection. Back pressure and infection may progress to bladder damage and kidney (renal) failure. What are the causes? This condition is part of a normal aging process. However, not all men develop problems from this condition. If the prostate enlarges away from the urethra, urine flow will not be blocked. If it enlarges toward the urethra and compresses it,  there will be problems passing urine. What increases the risk? This condition is more likely to develop in men over the age of 12 years. What are the signs or symptoms? Symptoms of this condition include:  Getting up often during the night to urinate.  Needing to urinate frequently during the day.  Difficulty starting urine flow.  Decrease in size and strength of your urine stream.  Leaking (dribbling) after urinating.  Inability to pass urine. This needs immediate treatment.  Inability to completely empty your bladder.  Pain when you pass urine. This is more common if there is also an infection.  Urinary tract infection (UTI). How is this diagnosed? This condition is diagnosed based on your medical history, a physical exam, and your symptoms. Tests will also be done, such as:  A post-void bladder scan. This measures any amount of urine that may remain in your bladder after you finish urinating.  A digital rectal exam. In a rectal exam, your health care provider checks your prostate by putting a lubricated, gloved finger into your rectum to feel the back of your prostate gland. This exam detects the size of your gland and any abnormal lumps or growths.  An exam of your urine (urinalysis).  A prostate specific antigen (PSA) screening. This is a blood test used to screen for prostate cancer.  An ultrasound. This test uses sound waves to electronically produce a picture of your prostate gland. Your health care provider may refer you to a specialist in kidney and prostate diseases (urologist). How is this treated? Once symptoms begin, your health care provider will monitor your condition (active surveillance or watchful waiting). Treatment for this condition will depend on the severity of your condition. Treatment may include:  Observation and yearly exams. This may be the only treatment needed if your condition and symptoms are mild.  Medicines to relieve your symptoms,  including: ? Medicines to shrink the prostate. ? Medicines to relax the muscle of the prostate.  Surgery in severe cases. Surgery may include: ? Prostatectomy. In this procedure, the prostate tissue is removed completely through an open incision or with a laparoscope or robotics. ? Transurethral resection of the prostate (TURP). In this procedure, a tool is inserted through the opening at the tip of the penis (urethra). It is used to cut away tissue of the inner core of the prostate. The pieces are removed through the same opening of the penis. This removes the blockage. ? Transurethral incision (TUIP). In this procedure, small cuts are made in the prostate. This lessens the prostate's pressure on the urethra. ? Transurethral microwave thermotherapy (TUMT). This procedure uses microwaves to create heat. The heat destroys and removes a small amount of prostate tissue. ? Transurethral needle ablation (TUNA). This procedure uses radio frequencies to destroy and remove a small amount of prostate tissue. ? Interstitial laser coagulation ([place]). This procedure uses a laser to destroy and remove a small amount of prostate tissue. ? Transurethral electrovaporization (TUVP). This procedure uses electrodes to destroy and remove a small amount of prostate tissue. ? Prostatic urethral lift. This procedure inserts an implant to push the lobes of the prostate away from the urethra. Follow these instructions at home:  Take over-the-counter and prescription medicines only as told by your health care provider.  Monitor your symptoms for any changes. Contact your health care provider with any changes.  Avoid drinking large amounts of liquid before going to bed or out in public.  Avoid or reduce how much caffeine or alcohol you drink.  Give yourself time when you urinate.  Keep all follow-up visits as told by your health care provider. This is important. Contact a health care provider if:  You have  unexplained back pain.  Your symptoms do not get better with treatment.  You develop side effects from the medicine you are taking.  Your urine becomes very dark or has a bad smell.  Your lower abdomen becomes distended and you have trouble passing your urine. Get help right away if:  You have a fever or chills.  You suddenly cannot urinate.  You feel lightheaded, or very dizzy, or you faint.  There are large amounts of blood or clots in the urine.  Your urinary problems become hard to manage.  You develop moderate to severe low back or flank pain. The flank is the side of your body between the ribs and the hip. These symptoms may represent a serious problem that is an emergency. Do not wait to see if the symptoms will go away. Get medical help right away. Call your local emergency services (911 in the [place] Do not drive yourself to the hospital. Summary  Benign prostatic hyperplasia (BPH) is an enlarged prostate that is caused by  the normal aging process and not by cancer.  An enlarged prostate can press on the urethra. This can make it hard to pass urine.  This condition is part of a normal aging process and is more likely to develop in men over the age of 65 years.  Get help right away if you suddenly cannot urinate. This information is not intended to replace advice given to you by your health care provider. Make sure you discuss any questions you have with your health care provider. Document Revised: 04/10/2020 Document Reviewed: 04/10/2020 Elsevier Patient Education  [place].

## 2020-12-11 NOTE — Progress Notes (Signed)
   12/11/2020 11:27 AM   ERVING SASSANO Apr 14, 1946 [id number]  Reason for visit: Follow up BPH, UTI, gross hematuria  HPI: I saw Mr. Tesfaye back in urology clinic today for follow-up of the above issues.  He is a 75 year old comorbid male with extensive CAD and PVD on Plavix who I originally saw in the fall 2020 with gross hematuria and passing pieces of pale tissue.  He did have a UTI on culture and his symptoms improved.  With his extensive smoking history and passage of tissue he underwent a work-up with a CT urogram and cystoscopy.  CT was benign and showed a 35 g prostate, and cystoscopy showed moderate bladder trabeculations and a small diverticula, but no tumors or lesions.    At our original visit we had started Flomax for his incomplete emptying and urinary symptoms, but for unclear reasons he discontinued that at some point in the last year.  At our last visit in January 2022, I again encouraged him to resume Flomax, but it sounds like he still has not been taking that medication.  IPSS score today is 11, with quality of life pleased, but PVR is elevated at 182 mL.  He denies any UTIs or gross hematuria since our last visit.  Risks and benefits of Flomax discussed again, new prescription sent to pharmacy.  We also discussed outlet procedures like UroLift or HOLEP with his history of elevated PVR and UTI, but he would like to hold off at this time as he is minimally bothered by his symptoms.  His primary complaint is urinary frequency and urgency during the day, but he also drinks soda and 4-5 beers per day.  He denies any nocturia.  Flomax 0.4 mg nightly RTC 6 months with PVR  Billey Co, MD  [facility] [address] [place], [place] [address] [phone]

## 2021-02-07 DIAGNOSIS — R197 Diarrhea, unspecified: Secondary | ICD-10-CM | POA: Diagnosis not present

## 2021-02-08 DIAGNOSIS — R197 Diarrhea, unspecified: Secondary | ICD-10-CM | POA: Diagnosis not present

## 2021-02-22 NOTE — Progress Notes (Signed)
MRN : [id number]  Ivan Reyes is a 75 y.o. (08-26-1945) male who presents with chief complaint of No chief complaint on file. Marland Kitchen  History of Present Illness:   The patient returns to the office for followup and review status post angiogram with intervention on 05/06/2020.   Procedure: Crosser atherectomy of the left SFA and popliteal arteries with percutaneous transluminal angioplasty and stent placement left superficial femoral artery and popliteal to 5 mm and percutaneous transluminal angioplasty of the left tibial peroneal trunk to 4 mm with a Lutonix drug-eluting balloon                         The patient notes improvement in the lower extremity symptoms. No interval shortening of the patient's claudication distance or rest pain symptoms. Previous wounds have now healed.  No new ulcers or wounds have occurred since the last visit.   There have been no significant changes to the patient's overall health care.   The patient denies amaurosis fugax or recent TIA symptoms. There are no recent neurological changes noted. The patient denies history of DVT, PE or superficial thrombophlebitis. The patient denies recent episodes of angina or shortness of breath.   ABI's Rt=1.07 and Lt=0.61  (previous ABI's Rt=0.97 and Lt=0.70) Duplex ultrasound of the left lower extremity demonstrates a patent SFA stent.  There is moderate restenosis at the leading edge of the stent with velocities of 335 cm/s which is increased compared to the velocity of 183 cm/s from the previous study.  No outpatient medications have been marked as taking for the 02/23/21 encounter (Appointment) with Delana Meyer, Dolores Lory, MD.    Past Medical History:  Diagnosis Date   Arthritis    Coronary artery disease    GERD (gastroesophageal reflux disease)    Hyperlipidemia    Hypertension    Mitral valve disorder    Peripheral vascular disease ([place])    Sleep apnea    use C-PAP    Past Surgical History:  Procedure  Laterality Date   BROW LIFT Bilateral 07/24/2020   Procedure: BLEPHAROPLASTY UPPER EYELID; W/EXCESS SKIN BILATERAL;  Surgeon: Karle Starch, MD;  Location: [facility];  Service: Ophthalmology;  Laterality: Bilateral;  sleep apnea   CAROTID ENDARTERECTOMY Bilateral    Dr. Delana Meyer, [place] Left 12/23/2016   Procedure: CARPAL TUNNEL RELEASE ENDOSCOPIC;  Surgeon: Corky Mull, MD;  Location: [facility];  Service: Orthopedics;  Laterality: Left;   CARPAL TUNNEL RELEASE Right 01/13/2017   Procedure: CARPAL TUNNEL RELEASE ENDOSCOPIC;  Surgeon: Corky Mull, MD;  Location: [facility];  Service: Orthopedics;  Laterality: Right;   COLONOSCOPY WITH PROPOFOL N/A 09/04/2018   Procedure: COLONOSCOPY WITH PROPOFOL;  Surgeon: Manya Silvas, MD;  Location: [facility];  Service: Endoscopy;  Laterality: N/A;   CORONARY ANGIOPLASTY     3 stents   coronary atherosclerosis of  autologous graft     ESOPHAGOGASTRODUODENOSCOPY (EGD) WITH PROPOFOL N/A 12/12/2017   Procedure: ESOPHAGOGASTRODUODENOSCOPY (EGD) WITH PROPOFOL;  Surgeon: Manya Silvas, MD;  Location: [facility] END[facility]COPY;  Service: Endoscopy;  Laterality: N/A;   ESOPHAGOGASTRODUODENOSCOPY (EGD) WITH PROPOFOL N/A 09/04/2018   Procedure: ESOPHAGOGASTRODUODENOSCOPY (EGD) WITH PROPOFOL;  Surgeon: Manya Silvas, MD;  Location: [facility] ENDOSCOPY;  Service: Endoscopy;  Laterality: N/A;   EYE SURGERY Bilateral    Cataract Extraction with IOL   FRACTURE SURGERY Right    Hip Pinning, Dr. Franchot Mimes, Jr   JOINT  REPLACEMENT     left knee   KNEE ARTHROPLASTY Left 06/30/2016   Procedure: COMPUTER ASSISTED TOTAL KNEE ARTHROPLASTY;  Surgeon: Dereck Leep, MD;  Location: [facility];  Service: Orthopedics;  Laterality: Left;   LOWER EXTREMITY ANGIOGRAPHY Left 05/06/2020   Procedure: LOWER EXTREMITY ANGIOGRAPHY;  Surgeon: Katha Cabal, MD;  Location: [facility] CV [facility];  Service: Cardiovascular;  Laterality: Left;   MASS EXCISION  Left 01/11/2018   Procedure: EXCISION CYSTIC MASS ANTERIOR KNEE;  Surgeon: Dereck Leep, MD;  Location: [facility];  Service: Orthopedics;  Laterality: Left;    Social History Social History   Tobacco Use   Smoking status: Every Day    Packs/day: 0.50    Years: 60.00    Pack years: 30.00    Types: Cigarettes   Smokeless tobacco: Never   Tobacco comments:    since age 74  Vaping Use   Vaping Use: Never used  Substance Use Topics   Alcohol use: Yes    Alcohol/week: 4.0 standard drinks    Types: 2 Cans of beer, 2 Standard drinks or equivalent per week    Comment: 4/ week   Drug use: No    Family History No family history on file.  No Known Allergies   REVIEW OF SYSTEMS (Negative unless checked)  Constitutional: [] Weight loss  [] Fever  [] Chills Cardiac: [] Chest pain   [] Chest pressure   [] Palpitations   [] Shortness of breath when laying flat   [] Shortness of breath with exertion. Vascular:  [] Pain in legs with walking   [] Pain in legs at rest  [] History of DVT   [] Phlebitis   [] Swelling in legs   [] Varicose veins   [] Non-healing ulcers Pulmonary:   [] Uses home oxygen   [] Productive cough   [] Hemoptysis   [] Wheeze  [] COPD   [] Asthma Neurologic:  [] Dizziness   [] Seizures   [] History of stroke   [] History of TIA  [] Aphasia   [] Vissual changes   [] Weakness or numbness in arm   [] Weakness or numbness in leg Musculoskeletal:   [] Joint swelling   [] Joint pain   [] Low back pain Hematologic:  [] Easy bruising  [] Easy bleeding   [] Hypercoagulable state   [] Anemic Gastrointestinal:  [] Diarrhea   [] Vomiting  [] Gastroesophageal reflux/heartburn   [] Difficulty swallowing. Genitourinary:  [] Chronic kidney disease   [] Difficult urination  [] Frequent urination   [] Blood in urine Skin:  [] Rashes   [] Ulcers  Psychological:  [] History of anxiety   []  History of major depression.  Physical Examination  There were no vitals filed for this visit. There is no height or weight on file to calculate  BMI. Gen: WD/WN, NAD Head: [place]/AT, No temporalis wasting.  Ear/Nose/Throat: Hearing grossly intact, nares w/o erythema or drainage Eyes: PER, EOMI, sclera nonicteric.  Neck: Supple, no large masses.   Pulmonary:  Good air movement, no audible wheezing bilaterally, no use of accessory muscles.  Cardiac: RRR, no JVD Vascular:   Vessel Right Left  Radial Palpable Palpable  PT Trace Palpable Not Palpable  DP Not Palpable Not Palpable  Gastrointestinal: Non-distended. No guarding/no peritoneal signs.  Musculoskeletal: M/S 5/5 throughout.  No deformity or atrophy.  Neurologic: CN 2-12 intact. Symmetrical.  Speech is fluent. Motor exam as listed above. Psychiatric: Judgment intact, Mood & affect appropriate for pt's clinical situation. Dermatologic: No rashes or ulcers noted.  No changes consistent with cellulitis. Lymph : No lichenification or skin changes of chronic lymphedema.  CBC Lab Results  Component Value Date   WBC 7.3 11/28/2020  HGB 12.2 (L) 11/28/2020   HCT 36.5 (L) 11/28/2020   MCV 85.1 11/28/2020   PLT 201 11/28/2020    BMET    Component Value Date/Time   NA 137 12/30/2017 1150   K 4.0 12/30/2017 1150   K 4.1 06/28/2014 1603   CL 105 12/30/2017 1150   CO2 24 12/30/2017 1150   GLUCOSE 89 12/30/2017 1150   BUN 13 05/06/2020 1137   CREATININE 1.00 05/06/2020 1137   CALCIUM 9.4 12/30/2017 1150   GFRNONAA >60 05/06/2020 1137   GFRAA >60 05/06/2020 1137   CrCl cannot be calculated (Patient's most recent lab result is older than the maximum 21 days allowed.).  COAG Lab Results  Component Value Date   INR 0.92 06/16/2016    Radiology No results found.   Assessment/Plan 1. Atherosclerosis of native artery of both lower extremities with intermittent claudication (HCC) Recommend:  The patient is status post successful angiogram with intervention.  The patient reports that the claudication symptoms and leg pain is essentially gone.   The patient denies  lifestyle limiting changes at this point in time.  I have discussed with the patient the lesion at the leading edge of her SFA stent.  It does appear to have increased over the past 3 months.  However, he is not having any recurrence of his claudication symptoms.  Therefore it was decided not to intervene at this time.  Rather we will reevaluate in 3 months instead of 6 months.  No further invasive studies, angiography or surgery at this time The patient should continue walking and begin a more formal exercise program.  The patient should continue antiplatelet therapy and aggressive treatment of the lipid abnormalities  The patient should continue wearing graduated compression socks 10-15 mmHg strength to control the mild edema.  Patient should undergo noninvasive studies as ordered. The patient will follow up with me after the studies.   - VAS [place] ABI WITH/WO TBI; Future - VAS [place] LOWER EXTREMITY ARTERIAL DUPLEX; Future  2. Bilateral carotid artery stenosis Recommend:   Given the patient's asymptomatic subcritical stenosis no further invasive testing or surgery at this time.   Continue antiplatelet therapy as prescribed Continue management of CAD, HTN and Hyperlipidemia Healthy heart diet,  encouraged exercise at least 4 times per week Follow up in 6 months with duplex ultrasound and physical exam  3. Primary hypertension Continue antihypertensive medications as already ordered, these medications have been reviewed and there are no changes at this time.   4. Coronary artery disease of native artery of native heart with stable angina pectoris (HCC) Continue cardiac and antihypertensive medications as already ordered and reviewed, no changes at this time.  Continue statin as ordered and reviewed, no changes at this time  Nitrates PRN for chest pain   5. Mixed hyperlipidemia Continue statin as ordered and reviewed, no changes at this time     Hortencia Pilar, MD  02/22/2021 8:09  PM

## 2021-02-23 ENCOUNTER — Ambulatory Visit (INDEPENDENT_AMBULATORY_CARE_PROVIDER_SITE_OTHER): Payer: PPO

## 2021-02-23 ENCOUNTER — Ambulatory Visit (INDEPENDENT_AMBULATORY_CARE_PROVIDER_SITE_OTHER): Payer: PPO | Admitting: Vascular Surgery

## 2021-02-23 ENCOUNTER — Encounter (INDEPENDENT_AMBULATORY_CARE_PROVIDER_SITE_OTHER): Payer: Self-pay | Admitting: Vascular Surgery

## 2021-02-23 ENCOUNTER — Other Ambulatory Visit: Payer: Self-pay

## 2021-02-23 VITALS — BP 152/66 | HR 52 | Resp 16 | Wt 167.4 lb

## 2021-02-23 DIAGNOSIS — I25118 Atherosclerotic heart disease of native coronary artery with other forms of angina pectoris: Secondary | ICD-10-CM

## 2021-02-23 DIAGNOSIS — I70213 Atherosclerosis of native arteries of extremities with intermittent claudication, bilateral legs: Secondary | ICD-10-CM

## 2021-02-23 DIAGNOSIS — I10 Essential (primary) hypertension: Secondary | ICD-10-CM

## 2021-02-23 DIAGNOSIS — I6523 Occlusion and stenosis of bilateral carotid arteries: Secondary | ICD-10-CM | POA: Diagnosis not present

## 2021-02-23 DIAGNOSIS — E782 Mixed hyperlipidemia: Secondary | ICD-10-CM

## 2021-02-24 ENCOUNTER — Encounter (INDEPENDENT_AMBULATORY_CARE_PROVIDER_SITE_OTHER): Payer: Self-pay | Admitting: Vascular Surgery

## 2021-03-04 ENCOUNTER — Inpatient Hospital Stay: Payer: PPO | Attending: Oncology

## 2021-03-04 ENCOUNTER — Inpatient Hospital Stay: Payer: PPO

## 2021-03-04 ENCOUNTER — Inpatient Hospital Stay: Payer: PPO | Admitting: Oncology

## 2021-03-17 DIAGNOSIS — D500 Iron deficiency anemia secondary to blood loss (chronic): Secondary | ICD-10-CM | POA: Diagnosis not present

## 2021-03-17 DIAGNOSIS — N529 Male erectile dysfunction, unspecified: Secondary | ICD-10-CM | POA: Diagnosis not present

## 2021-03-17 DIAGNOSIS — K224 Dyskinesia of esophagus: Secondary | ICD-10-CM | POA: Diagnosis not present

## 2021-03-17 DIAGNOSIS — K2270 Barrett's esophagus without dysplasia: Secondary | ICD-10-CM | POA: Diagnosis not present

## 2021-04-07 DIAGNOSIS — D500 Iron deficiency anemia secondary to blood loss (chronic): Secondary | ICD-10-CM | POA: Diagnosis not present

## 2021-04-07 DIAGNOSIS — I10 Essential (primary) hypertension: Secondary | ICD-10-CM | POA: Diagnosis not present

## 2021-04-07 DIAGNOSIS — Z125 Encounter for screening for malignant neoplasm of prostate: Secondary | ICD-10-CM | POA: Diagnosis not present

## 2021-04-14 DIAGNOSIS — I10 Essential (primary) hypertension: Secondary | ICD-10-CM | POA: Diagnosis not present

## 2021-04-14 DIAGNOSIS — Z0000 Encounter for general adult medical examination without abnormal findings: Secondary | ICD-10-CM | POA: Diagnosis not present

## 2021-04-14 DIAGNOSIS — I2510 Atherosclerotic heart disease of native coronary artery without angina pectoris: Secondary | ICD-10-CM | POA: Diagnosis not present

## 2021-04-14 DIAGNOSIS — E782 Mixed hyperlipidemia: Secondary | ICD-10-CM | POA: Diagnosis not present

## 2021-04-14 DIAGNOSIS — I70219 Atherosclerosis of native arteries of extremities with intermittent claudication, unspecified extremity: Secondary | ICD-10-CM | POA: Diagnosis not present

## 2021-04-14 DIAGNOSIS — K2270 Barrett's esophagus without dysplasia: Secondary | ICD-10-CM | POA: Diagnosis not present

## 2021-04-14 DIAGNOSIS — F17210 Nicotine dependence, cigarettes, uncomplicated: Secondary | ICD-10-CM | POA: Diagnosis not present

## 2021-04-14 DIAGNOSIS — G4733 Obstructive sleep apnea (adult) (pediatric): Secondary | ICD-10-CM | POA: Diagnosis not present

## 2021-04-14 DIAGNOSIS — Z0001 Encounter for general adult medical examination with abnormal findings: Secondary | ICD-10-CM | POA: Diagnosis not present

## 2021-04-30 ENCOUNTER — Inpatient Hospital Stay: Payer: PPO

## 2021-04-30 ENCOUNTER — Encounter: Payer: Self-pay | Admitting: Oncology

## 2021-04-30 ENCOUNTER — Inpatient Hospital Stay: Payer: PPO | Attending: Oncology | Admitting: Oncology

## 2021-04-30 VITALS — BP 138/60 | HR 73 | Temp 98.2°F | Resp 17 | Wt 163.0 lb

## 2021-04-30 VITALS — BP 133/57 | HR 61 | Resp 18

## 2021-04-30 DIAGNOSIS — D509 Iron deficiency anemia, unspecified: Secondary | ICD-10-CM | POA: Diagnosis not present

## 2021-04-30 DIAGNOSIS — F17210 Nicotine dependence, cigarettes, uncomplicated: Secondary | ICD-10-CM | POA: Diagnosis not present

## 2021-04-30 DIAGNOSIS — I10 Essential (primary) hypertension: Secondary | ICD-10-CM | POA: Diagnosis not present

## 2021-04-30 LAB — CBC WITH DIFFERENTIAL/PLATELET
Abs Immature Granulocytes: 0.06 10*3/uL (ref 0.00–0.07)
Basophils Absolute: 0.1 10*3/uL (ref 0.0–0.1)
Basophils Relative: 1 %
Eosinophils Absolute: 0.2 10*3/uL (ref 0.0–0.5)
Eosinophils Relative: 2 %
HCT: 39.2 % (ref 39.0–52.0)
Hemoglobin: 13.4 g/dL (ref 13.0–17.0)
Immature Granulocytes: 1 %
Lymphocytes Relative: 14 %
Lymphs Abs: 1.4 10*3/uL (ref 0.7–4.0)
MCH: 31.2 pg (ref 26.0–34.0)
MCHC: 34.2 g/dL (ref 30.0–36.0)
MCV: 91.2 fL (ref 80.0–100.0)
Monocytes Absolute: 0.6 10*3/uL (ref 0.1–1.0)
Monocytes Relative: 7 %
Neutro Abs: 7.2 10*3/uL (ref 1.7–7.7)
Neutrophils Relative %: 75 %
Platelets: 255 10*3/uL (ref 150–400)
RBC: 4.3 MIL/uL (ref 4.22–5.81)
RDW: 13 % (ref 11.5–15.5)
WBC: 9.5 10*3/uL (ref 4.0–10.5)
nRBC: 0 % (ref 0.0–0.2)

## 2021-04-30 LAB — FERRITIN: Ferritin: 60 ng/mL (ref 24–336)

## 2021-04-30 LAB — IRON AND TIBC
Iron: 41 ug/dL — ABNORMAL LOW (ref 45–182)
Saturation Ratios: 12 % — ABNORMAL LOW (ref 17.9–39.5)
TIBC: 347 ug/dL (ref 250–450)
UIBC: 306 ug/dL

## 2021-04-30 MED ORDER — SODIUM CHLORIDE 0.9 % IV SOLN
Freq: Once | INTRAVENOUS | Status: AC
  Filled 2021-04-30: qty 250

## 2021-04-30 MED ORDER — SODIUM CHLORIDE 0.9 % IV SOLN
510.0000 mg | Freq: Once | INTRAVENOUS | Status: AC
  Administered 2021-04-30: 510 mg via INTRAVENOUS
  Filled 2021-04-30: qty 510

## 2021-04-30 NOTE — Progress Notes (Signed)
1540- Patient declines to wait the post-Feraheme 30-minute observation time period. Patient and vital signs stable. Patient discharged to home at this time.

## 2021-04-30 NOTE — Progress Notes (Signed)
[place]  Telephone:([phone]) [phone] [phone]) [phone]  ID: [name] OB: Nov 20, 1945  MR#: [id number]  [id number]  Patient Care Team: Baxter Hire, MD as PCP - General (Internal Medicine) Lloyd Huger, MD as Consulting Physician (Hematology and Oncology)  CHIEF COMPLAINT: Iron deficiency anemia.  HPI: Mr. Ivan Reyes is a 75 year old male with past medical history significant for hypertension, CAD, sleep apnea, tobacco abuse, anemia and Barrett's esophagus who is followed by Dr. Grayland [facility] for iron deficiency anemia.  He receives intermittent Feraheme last given on 12/01/2020.  He is followed closely by urology for BPH and hematuria.  He last saw Dr. Caprice Beaver in April where he continued to have urinary retention.  He was started on Flomax.  He also has intermittent bilateral lower extremity claudication and is followed by vein and vascular.   INTERVAL HISTORY: Reports feeling much better post IV iron but over the past few months has noticed decline in his energy levels especially towards the end of the day.  Denies any evidence of bleeding.  Reports being very busy recently with a remodel which she finished approximately 3 weeks ago.   REVIEW OF SYSTEMS:   Review of Systems  Constitutional:  Positive for malaise/fatigue. Negative for chills, fever and weight loss.  HENT:  Negative for congestion, ear pain and tinnitus.   Eyes: Negative.  Negative for blurred vision and double vision.  Respiratory: Negative.  Negative for cough, sputum production and shortness of breath.   Cardiovascular: Negative.  Negative for chest pain, palpitations and leg swelling.  Gastrointestinal: Negative.  Negative for abdominal pain, constipation, diarrhea, nausea and vomiting.  Genitourinary:  Negative for dysuria, frequency and urgency.  Musculoskeletal:  Negative for back pain and falls.  Skin: Negative.  Negative for rash.  Neurological: Negative.  Negative for  weakness and headaches.  Endo/Heme/Allergies: Negative.  Does not bruise/bleed easily.  Psychiatric/Behavioral: Negative.  Negative for depression. The patient is not nervous/anxious and does not have insomnia.  As per HPI. Otherwise, a complete review of systems is negative.  PAST MEDICAL HISTORY: Past Medical History:  Diagnosis Date   Arthritis    Coronary artery disease    GERD (gastroesophageal reflux disease)    Hyperlipidemia    Hypertension    Mitral valve disorder    Peripheral vascular disease (HCC)    Sleep apnea    use C-PAP    PAST SURGICAL HISTORY: Past Surgical History:  Procedure Laterality Date   BROW LIFT Bilateral 07/24/2020   Procedure: BLEPHAROPLASTY UPPER EYELID; W/EXCESS SKIN BILATERAL;  Surgeon: Karle Starch, MD;  Location: [place];  Service: Ophthalmology;  Laterality: Bilateral;  sleep apnea   CAROTID ENDARTERECTOMY Bilateral    Dr. Delana Meyer, [place] Left 12/23/2016   Procedure: CARPAL TUNNEL RELEASE ENDOSCOPIC;  Surgeon: Corky Mull, MD;  Location: [facility];  Service: Orthopedics;  Laterality: Left;   CARPAL TUNNEL RELEASE Right 01/13/2017   Procedure: CARPAL TUNNEL RELEASE ENDOSCOPIC;  Surgeon: Corky Mull, MD;  Location: [facility];  Service: Orthopedics;  Laterality: Right;   COLONOSCOPY WITH PROPOFOL N/A 09/04/2018   Procedure: COLONOSCOPY WITH PROPOFOL;  Surgeon: Manya Silvas, MD;  Location: [facility] ENDOSCOPY;  Service: Endoscopy;  Laterality: N/A;   CORONARY ANGIOPLASTY     3 stents   coronary atherosclerosis of  autologous graft     ESOPHAGOGASTRODUODENOSCOPY (EGD) WITH PROPOFOL N/A 12/12/2017   Procedure: ESOPHAGOGASTRODUODENOSCOPY (EGD) WITH PROPOFOL;  Surgeon: Manya Silvas, MD;  Location: [facility]  ENDOSCOPY;  Service: Endoscopy;  Laterality: N/A;   ESOPHAGOGASTRODUODENOSCOPY (EGD) WITH PROPOFOL N/A 09/04/2018   Procedure: ESOPHAGOGASTRODUODENOSCOPY (EGD) WITH PROPOFOL;  Surgeon: Manya Silvas, MD;  Location:  [facility] ENDOSCOPY;  Service: Endoscopy;  Laterality: N/A;   EYE SURGERY Bilateral    Cataract Extraction with IOL   FRACTURE SURGERY Right    Hip Pinning, Dr. Franchot Mimes, Jr   JOINT REPLACEMENT     left knee   KNEE ARTHROPLASTY Left 06/30/2016   Procedure: COMPUTER ASSISTED TOTAL KNEE ARTHROPLASTY;  Surgeon: Dereck Leep, MD;  Location: [facility];  Service: Orthopedics;  Laterality: Left;   LOWER EXTREMITY ANGIOGRAPHY Left 05/06/2020   Procedure: LOWER EXTREMITY ANGIOGRAPHY;  Surgeon: Katha Cabal, MD;  Location: [place] CV LAB;  Service: Cardiovascular;  Laterality: Left;   MASS EXCISION Left 01/11/2018   Procedure: EXCISION CYSTIC MASS ANTERIOR KNEE;  Surgeon: Dereck Leep, MD;  Location: [facility];  Service: Orthopedics;  Laterality: Left;    FAMILY HISTORY: No family history on file.  ADVANCED DIRECTIVES (Y/N):  N  HEALTH MAINTENANCE: Social History   Tobacco Use   Smoking status: Every Day    Packs/day: 0.50    Years: 60.00    Pack years: 30.00    Types: Cigarettes   Smokeless tobacco: Never   Tobacco comments:    since age 89  Vaping Use   Vaping Use: Never used  Substance Use Topics   Alcohol use: Yes    Alcohol/week: 4.0 standard drinks    Types: 2 Cans of beer, 2 Standard drinks or equivalent per week    Comment: 4/ week   Drug use: No     Colonoscopy:  Lipid panel:  No Known Allergies  Current Outpatient Medications  Medication Sig Dispense Refill   Cholecalciferol (VITAMIN D) 2000 units CAPS Take 2,000 Units by mouth every evening.     clopidogrel (PLAVIX) 75 MG tablet Take 75 mg by mouth daily.     enalapril (VASOTEC) 20 MG tablet Take 10 mg by mouth 2 (two) times daily.     fenofibrate micronized (LOFIBRA) 134 MG capsule Take 134 mg by mouth daily before breakfast.      Glucosamine HCl (GLUCOSAMINE PO) Take 1 tablet by mouth 2 (two) times daily.     Multiple Vitamins-Minerals (PX COMPLETE SENIOR MULTIVITS) TABS Take 1 tablet by mouth.      Omega-3 Fatty Acids (OMEGA-3 FISH OIL) 300 MG CAPS Take 300 mg by mouth 2 (two) times daily.     pantoprazole (PROTONIX) 20 MG tablet Take 20 mg by mouth every morning.     sildenafil (REVATIO) 20 MG tablet Take 60-100 mg by mouth daily as needed (for erectile dysfunction). Pt states 2 morning and night     simvastatin (ZOCOR) 40 MG tablet Take 40 mg by mouth daily at 6 PM.      tamsulosin (FLOMAX) 0.4 MG CAPS capsule Take 1 capsule (0.4 mg total) by mouth daily. 30 capsule 11   vitamin B-12 (CYANOCOBALAMIN) 1000 MCG tablet Take 1,000 mcg by mouth daily.     vitamin E 400 UNIT capsule Take 400 Units by mouth every evening.      No current facility-administered medications for this visit.    OBJECTIVE: There were no vitals filed for this visit.   There is no height or weight on file to calculate BMI.    ECOG FS:0 - Asymptomatic  Physical Exam Constitutional:      Appearance: Normal appearance.  HENT:  Head: Normocephalic and atraumatic.  Eyes:     Pupils: Pupils are equal, round, and reactive to light.  Cardiovascular:     Rate and Rhythm: Normal rate and regular rhythm.     Heart sounds: Normal heart sounds. No murmur heard. Pulmonary:     Effort: Pulmonary effort is normal.     Breath sounds: Normal breath sounds. No wheezing.  Abdominal:     General: Bowel sounds are normal. There is no distension.     Palpations: Abdomen is soft.     Tenderness: There is no abdominal tenderness.  Musculoskeletal:        General: Normal range of motion.     Cervical back: Normal range of motion.  Skin:    General: Skin is warm and dry.     Findings: No rash.  Neurological:     Mental Status: He is alert and oriented to person, place, and time.  Psychiatric:        Judgment: Judgment normal.     LAB RESULTS:  Lab Results  Component Value Date   NA 137 12/30/2017   K 4.0 12/30/2017   CL 105 12/30/2017   CO2 24 12/30/2017   GLUCOSE 89 12/30/2017   BUN 13 05/06/2020    CREATININE 1.00 05/06/2020   CALCIUM 9.4 12/30/2017   PROT 7.5 06/16/2016   ALBUMIN 4.4 06/16/2016   AST 26 06/16/2016   ALT 16 (L) 06/16/2016   ALKPHOS 39 06/16/2016   BILITOT 0.6 06/16/2016   GFRNONAA >60 05/06/2020   GFRAA >60 05/06/2020    Lab Results  Component Value Date   WBC 7.3 11/28/2020   NEUTROABS 5.0 11/28/2020   HGB 12.2 (L) 11/28/2020   HCT 36.5 (L) 11/28/2020   MCV 85.1 11/28/2020   PLT 201 11/28/2020   Lab Results  Component Value Date   IRON 32 (L) 11/28/2020   TIBC 393 11/28/2020   IRONPCTSAT 8 (L) 11/28/2020   Lab Results  Component Value Date   FERRITIN 10 (L) 11/28/2020     STUDIES: No results found.   ASSESSMENT: Iron deficiency anemia.  PLAN:   1.  Iron deficiency anemia: IDA is likely multifactorial he does have a history of hematuria and is followed by urology.  Previously had a colonoscopy/EGD on 09/04/2018 which did not reveal any acute pathology.  No noticeable bleeding per patient.  GI capsule was postponed secondary to COVID.  He last received IV Feraheme on 12/01/2020.  He did note improvement of his energy.  Labs from 04/30/2021 show a ferritin of 60 with iron saturation of 12%.  Hemoglobin is 13.1.  Recommend 1 dose of IV Feraheme due to low iron saturations and symptoms.  Disposition: Proceed with IV Feraheme today.  RTC in 3 months for lab work and possible iron and in 6 months with lab work, MD assessment plus or minus IV iron.  I spent 20 minutes dedicated to the care of this patient (face-to-face and non-face-to-face) on the date of the encounter to include what is described in the assessment and plan.  Patient expressed understanding and was in agreement with this plan. He also understands that He can call clinic at any time with any questions, concerns, or complaints.   Jacquelin Hawking, NP   04/30/2021 1:50 PM

## 2021-04-30 NOTE — Progress Notes (Signed)
Patient here for oncology follow-up appointment, expresses no complaints or concerns at this time.    

## 2021-04-30 NOTE — Patient Instructions (Signed)
[facility]   Discharge Instructions: Thank you for choosing [facility] to provide your oncology and hematology care.  If you have a lab appointment with the [facility], please go directly to the [facility] and check in at the registration area.  We strive to give you quality time with your provider. You may need to reschedule your appointment if you arrive late (15 or more minutes).  Arriving late affects you and other patients whose appointments are after yours.  Also, if you miss three or more appointments without notifying the office, you may be dismissed from the clinic at the provider's discretion.      For prescription refill requests, have your pharmacy contact our office and allow 72 hours for refills to be completed.    Today you received the following: Feraheme.      BELOW ARE SYMPTOMS THAT SHOULD BE REPORTED IMMEDIATELY: *FEVER GREATER THAN 100.4 F (38 C) OR HIGHER *CHILLS OR SWEATING *NAUSEA AND VOMITING THAT IS NOT CONTROLLED WITH YOUR NAUSEA MEDICATION *UNUSUAL SHORTNESS OF BREATH *UNUSUAL BRUISING OR BLEEDING *URINARY PROBLEMS (pain or burning when urinating, or frequent urination) *BOWEL PROBLEMS (unusual diarrhea, constipation, pain near the anus) TENDERNESS IN MOUTH AND THROAT WITH OR WITHOUT PRESENCE OF ULCERS (sore throat, sores in mouth, or a toothache) UNUSUAL RASH, SWELLING OR PAIN  UNUSUAL VAGINAL DISCHARGE OR ITCHING   Items with * indicate a potential emergency and should be followed up as soon as possible or go to the Emergency Department if any problems should occur.  Should you have questions after your visit or need to cancel or reschedule your appointment, please contact [facility]  [phone] and follow the prompts.  Office hours are 8:00 a.m. to 4:30 p.m. Monday - Friday. Please note that voicemails left after 4:00 p.m. may not be returned until the following  business day.  We are closed weekends and major holidays. You have access to a nurse at all times for urgent questions. Please call the main number to the clinic [phone] and follow the prompts.  For any non-urgent questions, you may also contact your provider using MyChart. We now offer e-Visits for anyone 40 and older to request care online for non-urgent symptoms. For details visit [email or web address].   Also download the MyChart app! Go to the app store, search "MyChart", open the app, select [facility], and log in with your MyChart username and password.  Due to Covid, a mask is required upon entering the hospital/clinic. If you do not have a mask, one will be given to you upon arrival. For doctor visits, patients may have 1 support person aged 51 or older with them. For treatment visits, patients cannot have anyone with them due to current Covid guidelines and our immunocompromised population.

## 2021-05-08 ENCOUNTER — Inpatient Hospital Stay: Payer: PPO

## 2021-05-08 VITALS — BP 113/77 | HR 67 | Temp 97.0°F | Resp 18

## 2021-05-08 DIAGNOSIS — D509 Iron deficiency anemia, unspecified: Secondary | ICD-10-CM

## 2021-05-08 MED ORDER — SODIUM CHLORIDE 0.9 % IV SOLN
Freq: Once | INTRAVENOUS | Status: AC
  Filled 2021-05-08: qty 250

## 2021-05-08 MED ORDER — SODIUM CHLORIDE 0.9 % IV SOLN
510.0000 mg | Freq: Once | INTRAVENOUS | Status: AC
  Administered 2021-05-08: 510 mg via INTRAVENOUS
  Filled 2021-05-08: qty 510

## 2021-05-08 NOTE — Patient Instructions (Signed)
Ferumoxytol Injection What is this medication? FERUMOXYTOL (FER ue MOX i tol) treats low levels of iron in your body (iron deficiency anemia). Iron is a mineral that plays an important role in making red blood cells, which carry oxygen from your lungs to the rest of your body. This medicine may be used for other purposes; ask your health care provider or pharmacist if you have questions. COMMON BRAND NAME(S): Feraheme What should I tell my care team before I take this medication? They need to know if you have any of these conditions: Anemia not caused by low iron levels High levels of iron in the blood Magnetic resonance imaging (MRI) test scheduled An unusual or allergic reaction to iron, other medications, foods, dyes, or preservatives Pregnant or trying to get pregnant Breast-feeding How should I use this medication? This medication is for injection into a vein. It is given in a hospital or clinic setting. Talk to your care team the use of this medication in children. Special care may be needed. Overdosage: If you think you have taken too much of this medicine contact a poison control center or emergency room at once. NOTE: This medicine is only for you. Do not share this medicine with others. What if I miss a dose? It is important not to miss your dose. Call your care team if you are unable to keep an appointment. What may interact with this medication? Other iron products This list may not describe all possible interactions. Give your health care provider a list of all the medicines, herbs, non-prescription drugs, or dietary supplements you use. Also tell them if you smoke, drink alcohol, or use illegal drugs. Some items may interact with your medicine. What should I watch for while using this medication? Visit your care team regularly. Tell your care team if your symptoms do not start to get better or if they get worse. You may need blood work done while you are taking this  medication. You may need to follow a special diet. Talk to your care team. Foods that contain iron include: whole grains/cereals, dried fruits, beans, or peas, leafy green vegetables, and organ meats (liver, kidney). What side effects may I notice from receiving this medication? Side effects that you should report to your care team as soon as possible: Allergic reactions-skin rash, itching, hives, swelling of the face, lips, tongue, or throat Low blood pressure-dizziness, feeling faint or lightheaded, blurry vision Shortness of breath Side effects that usually do not require medical attention (report to your care team if they continue or are bothersome): Flushing Headache Joint pain Muscle pain Nausea Pain, redness, or irritation at injection site This list may not describe all possible side effects. Call your doctor for medical advice about side effects. You may report side effects to FDA at 1-800-FDA-1088. Where should I keep my medication? This medication is given in a hospital or clinic and will not be stored at home. NOTE: This sheet is a summary. It may not cover all possible information. If you have questions about this medicine, talk to your doctor, pharmacist, or health care provider.  2022 Elsevier/Gold Standard (2020-12-19 15:35:12)  

## 2021-05-26 ENCOUNTER — Ambulatory Visit (INDEPENDENT_AMBULATORY_CARE_PROVIDER_SITE_OTHER): Payer: PPO | Admitting: Nurse Practitioner

## 2021-05-26 ENCOUNTER — Ambulatory Visit (INDEPENDENT_AMBULATORY_CARE_PROVIDER_SITE_OTHER): Payer: PPO

## 2021-05-26 ENCOUNTER — Other Ambulatory Visit: Payer: Self-pay

## 2021-05-26 VITALS — BP 165/79 | HR 57 | Ht 70.0 in | Wt 171.0 lb

## 2021-05-26 DIAGNOSIS — Z720 Tobacco use: Secondary | ICD-10-CM

## 2021-05-26 DIAGNOSIS — E782 Mixed hyperlipidemia: Secondary | ICD-10-CM | POA: Diagnosis not present

## 2021-05-26 DIAGNOSIS — F17210 Nicotine dependence, cigarettes, uncomplicated: Secondary | ICD-10-CM

## 2021-05-26 DIAGNOSIS — I10 Essential (primary) hypertension: Secondary | ICD-10-CM | POA: Diagnosis not present

## 2021-05-26 DIAGNOSIS — I70213 Atherosclerosis of native arteries of extremities with intermittent claudication, bilateral legs: Secondary | ICD-10-CM

## 2021-06-04 ENCOUNTER — Encounter (INDEPENDENT_AMBULATORY_CARE_PROVIDER_SITE_OTHER): Payer: Self-pay | Admitting: Nurse Practitioner

## 2021-06-04 NOTE — Progress Notes (Signed)
Subjective:    Patient ID: Ivan Reyes, male    DOB: 02-Dec-1945, 75 y.o.   MRN: [id number] Chief Complaint  Patient presents with   Follow-up    3 mo abi+ LE art dup    Ivan Reyes is a 75 year old male that returns to the office for followup and review of the noninvasive studies. There have been no interval changes in lower extremity symptoms. No interval shortening of the patient's claudication distance or development of rest pain symptoms.  Patient notes that he is able to bike about 4.5 miles on a regular basis.  No new ulcers or wounds have occurred since the last visit.  There have been no significant changes to the patient's overall health care.  The patient denies amaurosis fugax or recent TIA symptoms. There are no recent neurological changes noted. The patient denies history of DVT, PE or superficial thrombophlebitis. The patient denies recent episodes of angina or shortness of breath.   ABI Rt=0.95 and Lt=0.84  (previous ABI's Rt=1.07 and Lt=0.61) Duplex ultrasound of the right lower extremity shows biphasic/triphasic waveforms with good toe waveforms.  The left lower extremity had an arterial duplex which reveals triphasic waveforms in the common femoral as well as deep femoral artery beginning at the proximal SFA stents are placed it transitions to monophasic waveforms throughout the tibials.  There is noted 50 to 74% stenosis at the distal SFA which is likely an area of stenosis and an older stent.   Review of Systems  Skin:  Negative for wound.  All other systems reviewed and are negative.     Objective:   Physical Exam Vitals reviewed.  HENT:     Head: Normocephalic.  Cardiovascular:     Rate and Rhythm: Normal rate.     Pulses:          Dorsalis pedis pulses are 1+ on the right side and detected w/ Doppler on the left side.       Posterior tibial pulses are 1+ on the right side and detected w/ Doppler on the left side.  Pulmonary:     Effort:  Pulmonary effort is normal.  Skin:    General: Skin is warm and dry.  Neurological:     Mental Status: He is alert and oriented to person, place, and time.  Psychiatric:        Mood and Affect: Mood normal.        Behavior: Behavior normal.        Thought Content: Thought content normal.        Judgment: Judgment normal.    BP (!) 165/79   Pulse (!) 57   Ht 5\' 10"  (1.778 m)   Wt 171 lb (77.6 kg)   BMI 24.54 kg/m   Past Medical History:  Diagnosis Date   Arthritis    Coronary artery disease    GERD (gastroesophageal reflux disease)    Hyperlipidemia    Hypertension    Mitral valve disorder    Peripheral vascular disease (HCC)    Sleep apnea    use C-PAP    Social History   Socioeconomic History   Marital status: Widowed    Spouse name: Not on file   Number of children: Not on file   Years of education: Not on file   Highest education level: Not on file  Occupational History   Not on file  Tobacco Use   Smoking status: Every Day    Packs/day: 0.50    Years:  60.00    Pack years: 30.00    Types: Cigarettes   Smokeless tobacco: Never   Tobacco comments:    since age 63  Vaping Use   Vaping Use: Never used  Substance and Sexual Activity   Alcohol use: Yes    Alcohol/week: 4.0 standard drinks    Types: 2 Cans of beer, 2 Standard drinks or equivalent per week    Comment: 4/ week   Drug use: No   Sexual activity: Not on file  Other Topics Concern   Not on file  Social History Narrative   Not on file   Social Determinants of Health   Financial Resource Strain: Not on file  Food Insecurity: Not on file  Transportation Needs: Not on file  Physical Activity: Not on file  Stress: Not on file  Social Connections: Not on file  Intimate Partner Violence: Not on file    Past Surgical History:  Procedure Laterality Date   BROW LIFT Bilateral 07/24/2020   Procedure: BLEPHAROPLASTY UPPER EYELID; W/EXCESS SKIN BILATERAL;  Surgeon: Karle Starch, MD;  Location:  [place];  Service: Ophthalmology;  Laterality: Bilateral;  sleep apnea   CAROTID ENDARTERECTOMY Bilateral    Dr. Delana Meyer, [place] Left 12/23/2016   Procedure: CARPAL TUNNEL RELEASE ENDOSCOPIC;  Surgeon: Corky Mull, MD;  Location: [facility];  Service: Orthopedics;  Laterality: Left;   CARPAL TUNNEL RELEASE Right 01/13/2017   Procedure: CARPAL TUNNEL RELEASE ENDOSCOPIC;  Surgeon: Corky Mull, MD;  Location: [facility];  Service: Orthopedics;  Laterality: Right;   COLONOSCOPY WITH PROPOFOL N/A 09/04/2018   Procedure: COLONOSCOPY WITH PROPOFOL;  Surgeon: Manya Silvas, MD;  Location: [facility] END[facility]COPY;  Service: Endoscopy;  Laterality: N/A;   CORONARY ANGIOPLASTY     3 stents   coronary atherosclerosis of  autologous graft     ESOPHAGOGASTRODUODENOSCOPY (EGD) WITH PROPOFOL N/A 12/12/2017   Procedure: ESOPHAGOGASTRODUODENOSCOPY (EGD) WITH PROPOFOL;  Surgeon: Manya Silvas, MD;  Location: [facility] ENDOSCOPY;  Service: Endoscopy;  Laterality: N/A;   ESOPHAGOGASTRODUODENOSCOPY (EGD) WITH PROPOFOL N/A 09/04/2018   Procedure: ESOPHAGOGASTRODUODENOSCOPY (EGD) WITH PROPOFOL;  Surgeon: Manya Silvas, MD;  Location: [facility] ENDOSCOPY;  Service: Endoscopy;  Laterality: N/A;   EYE SURGERY Bilateral    Cataract Extraction with IOL   FRACTURE SURGERY Right    Hip Pinning, Dr. Franchot Mimes, Jr   JOINT REPLACEMENT     left knee   KNEE ARTHROPLASTY Left 06/30/2016   Procedure: COMPUTER ASSISTED TOTAL KNEE ARTHROPLASTY;  Surgeon: Dereck Leep, MD;  Location: [facility];  Service: Orthopedics;  Laterality: Left;   LOWER EXTREMITY ANGIOGRAPHY Left 05/06/2020   Procedure: LOWER EXTREMITY ANGIOGRAPHY;  Surgeon: Katha Cabal, MD;  Location: [place] CV LAB;  Service: Cardiovascular;  Laterality: Left;   MASS EXCISION Left 01/11/2018   Procedure: EXCISION CYSTIC MASS ANTERIOR KNEE;  Surgeon: Dereck Leep, MD;  Location: [facility];  Service: Orthopedics;  Laterality: Left;     History reviewed. No pertinent family history.  No Known Allergies  CBC Latest Ref Rng & Units 04/30/2021 11/28/2020 08/29/2020  WBC 4.0 - 10.5 K/uL 9.5 7.3 6.1  Hemoglobin 13.0 - 17.0 g/dL 13.4 12.2(L) 8.1(L)  Hematocrit 39.0 - 52.0 % 39.2 36.5(L) 26.4(L)  Platelets 150 - 400 K/uL 255 201 325      CMP     Component Value Date/Time   NA 137 12/30/2017 1150   K 4.0 12/30/2017 1150   K 4.1 06/28/2014  1603   CL 105 12/30/2017 1150   CO2 24 12/30/2017 1150   GLUCOSE 89 12/30/2017 1150   BUN 13 05/06/2020 1137   CREATININE 1.00 05/06/2020 1137   CALCIUM 9.4 12/30/2017 1150   PROT 7.5 06/16/2016 0947   ALBUMIN 4.4 06/16/2016 0947   AST 26 06/16/2016 0947   ALT 16 (L) 06/16/2016 0947   ALKPHOS 39 06/16/2016 0947   BILITOT 0.6 06/16/2016 0947   GFRNONAA >60 05/06/2020 1137   GFRAA >60 05/06/2020 1137     No results found.     Assessment & Plan:   1. Atherosclerosis of native artery of both lower extremities with intermittent claudication (HCC)  Recommend:  The patient has evidence of atherosclerosis of the lower extremities with claudication.  The patient does not voice lifestyle limiting changes at this point in time.  Noninvasive studies do not suggest clinically significant change.  No invasive studies, angiography or surgery at this time The patient should continue walking and begin a more formal exercise program.  The patient should continue antiplatelet therapy and aggressive treatment of the lipid abnormalities  No changes in the patient's medications at this time  The patient should continue wearing graduated compression socks 10-15 mmHg strength to control the mild edema.    Patient return in 6 months or sooner if issues arise - VAS [place] ABI WITH/WO TBI; Future - VAS [place] LOWER EXTREMITY ARTERIAL DUPLEX; Future  2. Primary hypertension Continue antihypertensive medications as already ordered, these medications have been reviewed and there are no changes at  this time.   3. Mixed hyperlipidemia Continue statin as ordered and reviewed, no changes at this time   4. Tobacco use Smoking cessation was discussed, 3-10 minutes spent on this topic specifically    Current Outpatient Medications on File Prior to Visit  Medication Sig Dispense Refill   Cholecalciferol (VITAMIN D) 2000 units CAPS Take 2,000 Units by mouth every evening.     clopidogrel (PLAVIX) 75 MG tablet Take 75 mg by mouth daily.     enalapril (VASOTEC) 20 MG tablet Take 10 mg by mouth 2 (two) times daily.     fenofibrate micronized (LOFIBRA) 134 MG capsule Take 134 mg by mouth daily before breakfast.      Glucosamine HCl (GLUCOSAMINE PO) Take 1 tablet by mouth 2 (two) times daily.     Multiple Vitamins-Minerals (PX COMPLETE SENIOR MULTIVITS) TABS Take 1 tablet by mouth.     Omega-3 Fatty Acids (OMEGA-3 FISH OIL) 300 MG CAPS Take 300 mg by mouth 2 (two) times daily.     pantoprazole (PROTONIX) 20 MG tablet Take 20 mg by mouth every morning.     sildenafil (REVATIO) 20 MG tablet Take 60-100 mg by mouth daily as needed (for erectile dysfunction). Pt states 2 morning and night     simvastatin (ZOCOR) 40 MG tablet Take 40 mg by mouth daily at 6 PM.      tamsulosin (FLOMAX) 0.4 MG CAPS capsule Take 1 capsule (0.4 mg total) by mouth daily. 30 capsule 11   vitamin B-12 (CYANOCOBALAMIN) 1000 MCG tablet Take 1,000 mcg by mouth daily.     vitamin E 400 UNIT capsule Take 400 Units by mouth every evening.      No current facility-administered medications on file prior to visit.    There are no Patient Instructions on file for this visit. Return in about 6 months (around 11/24/2021), or if symptoms worsen or fail to improve, for PAD.   Kris Hartmann,  NP

## 2021-06-11 ENCOUNTER — Ambulatory Visit: Payer: PPO | Admitting: Urology

## 2021-06-11 ENCOUNTER — Encounter: Payer: Self-pay | Admitting: Urology

## 2021-06-11 ENCOUNTER — Other Ambulatory Visit: Payer: Self-pay

## 2021-06-11 VITALS — BP 175/75 | HR 61 | Ht 70.0 in | Wt 165.0 lb

## 2021-06-11 DIAGNOSIS — Z125 Encounter for screening for malignant neoplasm of prostate: Secondary | ICD-10-CM | POA: Diagnosis not present

## 2021-06-11 DIAGNOSIS — N138 Other obstructive and reflux uropathy: Secondary | ICD-10-CM

## 2021-06-11 DIAGNOSIS — N401 Enlarged prostate with lower urinary tract symptoms: Secondary | ICD-10-CM

## 2021-06-11 LAB — BLADDER SCAN AMB NON-IMAGING

## 2021-06-11 NOTE — Patient Instructions (Signed)
Cutting back on sodas, coffee, teas, alcohol, and smoking will likely improve some of your urinary symptoms and frequency/urgency to get to the bathroom.  If you have blood in the urine or urinary infections, please call us, otherwise we will follow-up in 1 year.

## 2021-06-11 NOTE — Progress Notes (Signed)
   06/11/2021 10:17 AM   [name] 04/07/1946 [id number]  Reason for visit: Follow up BPH, UTI, gross hematuria, PSA screening  HPI: Very comorbid 75 year old male with extensive CAD/PVD on Plavix who I originally saw in the fall 2020 with gross hematuria and passing pieces of pale tissue.  He did have a UTI on culture and his symptoms improved.  With his extensive smoking history and passage of tissue he underwent a work-up with a CT urogram and cystoscopy.  CT was benign and showed a 35 g prostate, and cystoscopy showed moderate bladder trabeculations and a small diverticula, but no tumors or lesions.    He has been on Flomax for weak urinary stream with mildly elevated PVRs.  PSA in August 2022 was normal at 1.07.  With his age and comorbidities, he is not a good candidate for ongoing PSA screening, and I recommended discontinuing PSA screening.  Urinalysis in August 2022 was benign.  He denies any urologic changes over the last year.  PVR is normal today at 103 mL.  He has some urgency and frequency during the day, but denies any nocturia overnight.  He continues on the Flomax.  He drinks primarily soda, beer, and water during the day, and continues to be a heavy smoker.    -Behavioral strategies discussed at length regarding urinary symptoms including minimizing soda, beer, and smoking -Continue Flomax -RTC 1 year PVR    Billey Co, MD  [facility] [address] [place], [place] [address] [phone]

## 2021-07-30 ENCOUNTER — Inpatient Hospital Stay: Payer: PPO | Attending: Oncology

## 2021-07-30 ENCOUNTER — Inpatient Hospital Stay: Payer: PPO

## 2021-09-01 DIAGNOSIS — D225 Melanocytic nevi of trunk: Secondary | ICD-10-CM | POA: Diagnosis not present

## 2021-09-01 DIAGNOSIS — D2262 Melanocytic nevi of left upper limb, including shoulder: Secondary | ICD-10-CM | POA: Diagnosis not present

## 2021-09-01 DIAGNOSIS — D2272 Melanocytic nevi of left lower limb, including hip: Secondary | ICD-10-CM | POA: Diagnosis not present

## 2021-09-01 DIAGNOSIS — L821 Other seborrheic keratosis: Secondary | ICD-10-CM | POA: Diagnosis not present

## 2021-09-01 DIAGNOSIS — D2271 Melanocytic nevi of right lower limb, including hip: Secondary | ICD-10-CM | POA: Diagnosis not present

## 2021-09-01 DIAGNOSIS — D2261 Melanocytic nevi of right upper limb, including shoulder: Secondary | ICD-10-CM | POA: Diagnosis not present

## 2021-09-01 DIAGNOSIS — X32XXXA Exposure to sunlight, initial encounter: Secondary | ICD-10-CM | POA: Diagnosis not present

## 2021-09-01 DIAGNOSIS — L814 Other melanin hyperpigmentation: Secondary | ICD-10-CM | POA: Diagnosis not present

## 2021-09-01 DIAGNOSIS — D485 Neoplasm of uncertain behavior of skin: Secondary | ICD-10-CM | POA: Diagnosis not present

## 2021-09-12 DIAGNOSIS — J019 Acute sinusitis, unspecified: Secondary | ICD-10-CM | POA: Diagnosis not present

## 2021-09-20 DIAGNOSIS — R051 Acute cough: Secondary | ICD-10-CM | POA: Diagnosis not present

## 2021-09-20 DIAGNOSIS — J0100 Acute maxillary sinusitis, unspecified: Secondary | ICD-10-CM | POA: Diagnosis not present

## 2021-09-20 DIAGNOSIS — R059 Cough, unspecified: Secondary | ICD-10-CM | POA: Diagnosis not present

## 2021-10-06 DIAGNOSIS — I10 Essential (primary) hypertension: Secondary | ICD-10-CM | POA: Diagnosis not present

## 2021-10-13 DIAGNOSIS — I10 Essential (primary) hypertension: Secondary | ICD-10-CM | POA: Diagnosis not present

## 2021-10-13 DIAGNOSIS — D509 Iron deficiency anemia, unspecified: Secondary | ICD-10-CM | POA: Diagnosis not present

## 2021-10-13 DIAGNOSIS — E782 Mixed hyperlipidemia: Secondary | ICD-10-CM | POA: Diagnosis not present

## 2021-10-13 DIAGNOSIS — I70219 Atherosclerosis of native arteries of extremities with intermittent claudication, unspecified extremity: Secondary | ICD-10-CM | POA: Diagnosis not present

## 2021-10-13 DIAGNOSIS — Z0000 Encounter for general adult medical examination without abnormal findings: Secondary | ICD-10-CM | POA: Diagnosis not present

## 2021-10-13 DIAGNOSIS — I2510 Atherosclerotic heart disease of native coronary artery without angina pectoris: Secondary | ICD-10-CM | POA: Diagnosis not present

## 2021-10-13 DIAGNOSIS — G4733 Obstructive sleep apnea (adult) (pediatric): Secondary | ICD-10-CM | POA: Diagnosis not present

## 2021-10-13 DIAGNOSIS — K2270 Barrett's esophagus without dysplasia: Secondary | ICD-10-CM | POA: Diagnosis not present

## 2021-10-13 DIAGNOSIS — Z125 Encounter for screening for malignant neoplasm of prostate: Secondary | ICD-10-CM | POA: Diagnosis not present

## 2021-10-13 DIAGNOSIS — F17210 Nicotine dependence, cigarettes, uncomplicated: Secondary | ICD-10-CM | POA: Diagnosis not present

## 2021-10-20 ENCOUNTER — Other Ambulatory Visit: Payer: Self-pay | Admitting: *Deleted

## 2021-10-20 DIAGNOSIS — D509 Iron deficiency anemia, unspecified: Secondary | ICD-10-CM

## 2021-10-23 NOTE — Progress Notes (Unsigned)
Ivan Reyes  [phone] [phone]  ID: [name] OB: [birth year]  MR#: [id number]  WIO#:[id number]  Patient Care Team: Baxter Hire, MD as PCP - General (Internal Medicine) Lloyd Huger, MD as Consulting Physician (Hematology and Oncology)  CHIEF COMPLAINT: Iron deficiency anemia.  INTERVAL HISTORY: Patient returns to clinic today for repeat laboratory work, further evaluation, and continuation of IV Feraheme.  He felt initially improved after receiving IV iron several months ago, but still has persistent chronic weakness and fatigue.  He otherwise feels well.  He has no neurologic complaints.  He denies any recent fevers or illnesses.  He has a good appetite and denies weight loss.  He denies any chest pain, shortness of breath, cough, or hemoptysis.  He denies any nausea, vomiting, constipation, or diarrhea.  He has no melena or hematochezia.  He has no urinary complaints.  Patient offers no further specific complaints today.  REVIEW OF SYSTEMS:   Review of Systems  Constitutional:  Positive for malaise/fatigue. Negative for fever and weight loss.  Respiratory: Negative.  Negative for cough and shortness of breath.   Cardiovascular: Negative.  Negative for chest pain and leg swelling.  Gastrointestinal: Negative.  Negative for abdominal pain, blood in stool and melena.  Genitourinary: Negative.  Negative for dysuria and hematuria.  Musculoskeletal: Negative.  Negative for back pain.  Skin: Negative.  Negative for rash.  Neurological:  Positive for weakness. Negative for dizziness, focal weakness and headaches.  Psychiatric/Behavioral: Negative.  The patient is not nervous/anxious.    As per HPI. Otherwise, a complete review of systems is negative.  PAST MEDICAL HISTORY: Past Medical History:  Diagnosis Date   Arthritis    Coronary artery disease    GERD (gastroesophageal reflux disease)    Hyperlipidemia    Hypertension     Mitral valve disorder    Peripheral vascular disease (HCC)    Sleep apnea    use C-PAP    PAST SURGICAL HISTORY: Past Surgical History:  Procedure Laterality Date   BROW LIFT Bilateral 07/24/2020   Procedure: BLEPHAROPLASTY UPPER EYELID; W/EXCESS SKIN BILATERAL;  Surgeon: Karle Starch, MD;  Location: [place];  Service: Ophthalmology;  Laterality: Bilateral;  sleep apnea   CAROTID ENDARTERECTOMY Bilateral    Dr. Delana Meyer, [place] Left 12/23/2016   Procedure: CARPAL TUNNEL RELEASE ENDOSCOPIC;  Surgeon: Corky Mull, MD;  Location: [facility];  Service: Orthopedics;  Laterality: Left;   CARPAL TUNNEL RELEASE Right 01/13/2017   Procedure: CARPAL TUNNEL RELEASE ENDOSCOPIC;  Surgeon: Corky Mull, MD;  Location: [facility];  Service: Orthopedics;  Laterality: Right;   COLONOSCOPY WITH PROPOFOL N/A 09/04/2018   Procedure: COLONOSCOPY WITH PROPOFOL;  Surgeon: Manya Silvas, MD;  Location: [facility];  Service: Endoscopy;  Laterality: N/A;   CORONARY ANGIOPLASTY     3 stents   coronary atherosclerosis of  autologous graft     ESOPHAGOGASTRODUODENOSCOPY (EGD) WITH PROPOFOL N/A 12/12/2017   Procedure: ESOPHAGOGASTRODUODENOSCOPY (EGD) WITH PROPOFOL;  Surgeon: Manya Silvas, MD;  Location: [facility] END[facility]COPY;  Service: Endoscopy;  Laterality: N/A;   ESOPHAGOGASTRODUODENOSCOPY (EGD) WITH PROPOFOL N/A 09/04/2018   Procedure: ESOPHAGOGASTRODUODENOSCOPY (EGD) WITH PROPOFOL;  Surgeon: Manya Silvas, MD;  Location: [facility] END[facility]COPY;  Service: Endoscopy;  Laterality: N/A;   EYE SURGERY Bilateral    Cataract Extraction with IOL   FRACTURE SURGERY Right    Hip Pinning, Dr. Franchot Mimes, Jr   JOINT REPLACEMENT  left knee   KNEE ARTHROPLASTY Left 06/30/2016   Procedure: COMPUTER ASSISTED TOTAL KNEE ARTHROPLASTY;  Surgeon: Dereck Leep, MD;  Location: [facility];  Service: Orthopedics;  Laterality: Left;   LOWER EXTREMITY ANGIOGRAPHY Left 05/06/2020   Procedure:  LOWER EXTREMITY ANGIOGRAPHY;  Surgeon: Katha Cabal, MD;  Location: [place] [facility];  Service: Cardiovascular;  Laterality: Left;   MASS EXCISION Left 01/11/2018   Procedure: EXCISION CYSTIC MASS ANTERIOR KNEE;  Surgeon: Dereck Leep, MD;  Location: [facility];  Service: Orthopedics;  Laterality: Left;    FAMILY HISTORY: No family history on file.  ADVANCED DIRECTIVES (Y/N):  N  HEALTH MAINTENANCE: Social History   Tobacco Use   Smoking status: Every Day    Packs/day: 0.50    Years: 60.00    Pack years: 30.00    Types: Cigarettes   Smokeless tobacco: Never   Tobacco comments:    since age 76  Vaping Use   Vaping Use: Never used  Substance Use Topics   Alcohol use: Yes    Alcohol/week: 4.0 standard drinks    Types: 2 Cans of beer, 2 Standard drinks or equivalent per week    Comment: 4/ week   Drug use: No     Colonoscopy:  PAP:  Bone density:  Lipid panel:  No Known Allergies  Current Outpatient Medications  Medication Sig Dispense Refill   Cholecalciferol (VITAMIN D) 2000 units CAPS Take 2,000 Units by mouth every evening.     clopidogrel (PLAVIX) 75 MG tablet Take 75 mg by mouth daily.     enalapril (VASOTEC) 20 MG tablet Take 10 mg by mouth 2 (two) times daily.     fenofibrate micronized (LOFIBRA) 134 MG capsule Take 134 mg by mouth daily before breakfast.      Glucosamine HCl (GLUCOSAMINE PO) Take 1 tablet by mouth 2 (two) times daily.     Multiple Vitamins-Minerals (PX COMPLETE SENIOR MULTIVITS) TABS Take 1 tablet by mouth.     Omega-3 Fatty Acids (OMEGA-3 FISH OIL) 300 MG CAPS Take 300 mg by mouth 2 (two) times daily.     pantoprazole (PROTONIX) 20 MG tablet Take 20 mg by mouth every morning.     sildenafil (REVATIO) 20 MG tablet Take 60-100 mg by mouth daily as needed (for erectile dysfunction). Pt states 2 morning and night     simvastatin (ZOCOR) 40 MG tablet Take 40 mg by mouth daily at 6 PM.      tamsulosin (FLOMAX) 0.4 MG CAPS capsule Take 1  capsule (0.4 mg total) by mouth daily. 30 capsule 11   vitamin B-12 (CYANOCOBALAMIN) 1000 MCG tablet Take 1,000 mcg by mouth daily.     vitamin E 400 UNIT capsule Take 400 Units by mouth every evening.      No current facility-administered medications for this visit.    OBJECTIVE: There were no vitals filed for this visit.    There is no height or weight on file to calculate BMI.    ECOG FS:0 - Asymptomatic  General: Well-developed, well-nourished, no acute distress. Eyes: Pink conjunctiva, anicteric sclera. HEENT: Normocephalic, moist mucous membranes. Lungs: No audible wheezing or coughing. Heart: Regular rate and rhythm. Abdomen: Soft, nontender, no obvious distention. Musculoskeletal: No edema, cyanosis, or clubbing. Neuro: Alert, answering all questions appropriately. Cranial nerves grossly intact. Skin: No rashes or petechiae noted. Psych: Normal affect.   LAB RESULTS:  Lab Results  Component Value Date   NA 137 12/30/2017   K 4.0 12/30/2017  CL 105 12/30/2017   CO2 24 12/30/2017   GLUCOSE 89 12/30/2017   BUN 13 05/06/2020   CREATININE 1.00 05/06/2020   CALCIUM 9.4 12/30/2017   PROT 7.5 06/16/2016   ALBUMIN 4.4 06/16/2016   AST 26 06/16/2016   ALT 16 (L) 06/16/2016   ALKPHOS 39 06/16/2016   BILITOT 0.6 06/16/2016   GFRNONAA >60 05/06/2020   GFRAA >60 05/06/2020    Lab Results  Component Value Date   WBC 9.5 04/30/2021   NEUTROABS 7.2 04/30/2021   HGB 13.4 04/30/2021   HCT 39.2 04/30/2021   MCV 91.2 04/30/2021   PLT 255 04/30/2021   Lab Results  Component Value Date   IRON 41 (L) 04/30/2021   TIBC 347 04/30/2021   IRONPCTSAT 12 (L) 04/30/2021   Lab Results  Component Value Date   FERRITIN 60 04/30/2021     STUDIES: No results found.  ASSESSMENT: Iron deficiency anemia.  PLAN:   1.  Iron deficiency anemia: Patient's hemoglobin has significantly improved and is now essentially within normal limits at 12.6.  His iron stores remain decreased,  therefore will pursue an additional 510 mg IV Feraheme today.  Colonoscopy and upper endoscopy on September 04, 2018 did not reveal any significant pathology.  Return to clinic in 3 months with repeat laboratory work, further evaluation, and continuation of treatment if needed.  I spent a total of 30 minutes reviewing chart data, face-to-face evaluation with the patient, counseling and coordination of care as detailed above.   Patient expressed understanding and was in agreement with this plan. He also understands that He can call clinic at any time with any questions, concerns, or complaints.    Lloyd Huger, MD   10/23/2021 9:39 AM

## 2021-10-29 ENCOUNTER — Inpatient Hospital Stay: Payer: PPO | Attending: Oncology

## 2021-10-29 ENCOUNTER — Inpatient Hospital Stay: Payer: PPO | Admitting: Oncology

## 2021-10-29 ENCOUNTER — Encounter: Payer: Self-pay | Admitting: Oncology

## 2021-10-29 ENCOUNTER — Inpatient Hospital Stay: Payer: PPO

## 2021-10-29 ENCOUNTER — Other Ambulatory Visit: Payer: Self-pay

## 2021-10-29 VITALS — BP 113/72 | HR 71 | Wt 174.4 lb

## 2021-10-29 DIAGNOSIS — F17210 Nicotine dependence, cigarettes, uncomplicated: Secondary | ICD-10-CM | POA: Diagnosis not present

## 2021-10-29 DIAGNOSIS — Z79899 Other long term (current) drug therapy: Secondary | ICD-10-CM | POA: Diagnosis not present

## 2021-10-29 DIAGNOSIS — D509 Iron deficiency anemia, unspecified: Secondary | ICD-10-CM | POA: Insufficient documentation

## 2021-10-29 LAB — IRON AND TIBC
Iron: 127 ug/dL (ref 45–182)
Saturation Ratios: 34 % (ref 17.9–39.5)
TIBC: 375 ug/dL (ref 250–450)
UIBC: 248 ug/dL

## 2021-10-29 LAB — FERRITIN: Ferritin: 50 ng/mL (ref 24–336)

## 2021-10-29 LAB — CBC WITH DIFFERENTIAL/PLATELET
Abs Immature Granulocytes: 0.03 10*3/uL (ref 0.00–0.07)
Basophils Absolute: 0 10*3/uL (ref 0.0–0.1)
Basophils Relative: 1 %
Eosinophils Absolute: 0.3 10*3/uL (ref 0.0–0.5)
Eosinophils Relative: 4 %
HCT: 39.3 % (ref 39.0–52.0)
Hemoglobin: 13.4 g/dL (ref 13.0–17.0)
Immature Granulocytes: 0 %
Lymphocytes Relative: 24 %
Lymphs Abs: 1.6 10*3/uL (ref 0.7–4.0)
MCH: 31.5 pg (ref 26.0–34.0)
MCHC: 34.1 g/dL (ref 30.0–36.0)
MCV: 92.3 fL (ref 80.0–100.0)
Monocytes Absolute: 0.6 10*3/uL (ref 0.1–1.0)
Monocytes Relative: 9 %
Neutro Abs: 4.2 10*3/uL (ref 1.7–7.7)
Neutrophils Relative %: 62 %
Platelets: 215 10*3/uL (ref 150–400)
RBC: 4.26 MIL/uL (ref 4.22–5.81)
RDW: 13.6 % (ref 11.5–15.5)
WBC: 6.8 10*3/uL (ref 4.0–10.5)
nRBC: 0 % (ref 0.0–0.2)

## 2021-11-01 ENCOUNTER — Encounter: Payer: Self-pay | Admitting: Oncology

## 2021-11-23 ENCOUNTER — Ambulatory Visit (INDEPENDENT_AMBULATORY_CARE_PROVIDER_SITE_OTHER): Payer: PPO | Admitting: Vascular Surgery

## 2021-11-23 ENCOUNTER — Ambulatory Visit (INDEPENDENT_AMBULATORY_CARE_PROVIDER_SITE_OTHER): Payer: PPO

## 2021-11-23 DIAGNOSIS — I70213 Atherosclerosis of native arteries of extremities with intermittent claudication, bilateral legs: Secondary | ICD-10-CM

## 2021-12-02 ENCOUNTER — Encounter (INDEPENDENT_AMBULATORY_CARE_PROVIDER_SITE_OTHER): Payer: Self-pay | Admitting: *Deleted

## 2022-01-01 DIAGNOSIS — H524 Presbyopia: Secondary | ICD-10-CM | POA: Diagnosis not present

## 2022-04-08 DIAGNOSIS — Z125 Encounter for screening for malignant neoplasm of prostate: Secondary | ICD-10-CM | POA: Diagnosis not present

## 2022-04-08 DIAGNOSIS — I10 Essential (primary) hypertension: Secondary | ICD-10-CM | POA: Diagnosis not present

## 2022-04-08 DIAGNOSIS — E782 Mixed hyperlipidemia: Secondary | ICD-10-CM | POA: Diagnosis not present

## 2022-04-15 DIAGNOSIS — R918 Other nonspecific abnormal finding of lung field: Secondary | ICD-10-CM | POA: Diagnosis not present

## 2022-04-15 DIAGNOSIS — G4733 Obstructive sleep apnea (adult) (pediatric): Secondary | ICD-10-CM | POA: Diagnosis not present

## 2022-04-15 DIAGNOSIS — E782 Mixed hyperlipidemia: Secondary | ICD-10-CM | POA: Diagnosis not present

## 2022-04-15 DIAGNOSIS — Z0001 Encounter for general adult medical examination with abnormal findings: Secondary | ICD-10-CM | POA: Diagnosis not present

## 2022-04-15 DIAGNOSIS — F17210 Nicotine dependence, cigarettes, uncomplicated: Secondary | ICD-10-CM | POA: Diagnosis not present

## 2022-04-15 DIAGNOSIS — I70219 Atherosclerosis of native arteries of extremities with intermittent claudication, unspecified extremity: Secondary | ICD-10-CM | POA: Diagnosis not present

## 2022-04-15 DIAGNOSIS — I25118 Atherosclerotic heart disease of native coronary artery with other forms of angina pectoris: Secondary | ICD-10-CM | POA: Diagnosis not present

## 2022-04-15 DIAGNOSIS — J40 Bronchitis, not specified as acute or chronic: Secondary | ICD-10-CM | POA: Diagnosis not present

## 2022-04-15 DIAGNOSIS — I2510 Atherosclerotic heart disease of native coronary artery without angina pectoris: Secondary | ICD-10-CM | POA: Diagnosis not present

## 2022-04-15 DIAGNOSIS — I10 Essential (primary) hypertension: Secondary | ICD-10-CM | POA: Diagnosis not present

## 2022-04-15 DIAGNOSIS — Z720 Tobacco use: Secondary | ICD-10-CM | POA: Diagnosis not present

## 2022-04-29 DIAGNOSIS — N529 Male erectile dysfunction, unspecified: Secondary | ICD-10-CM | POA: Diagnosis not present

## 2022-04-29 DIAGNOSIS — K224 Dyskinesia of esophagus: Secondary | ICD-10-CM | POA: Diagnosis not present

## 2022-04-29 DIAGNOSIS — K2270 Barrett's esophagus without dysplasia: Secondary | ICD-10-CM | POA: Diagnosis not present

## 2022-05-03 ENCOUNTER — Other Ambulatory Visit: Payer: PPO

## 2022-05-04 ENCOUNTER — Ambulatory Visit: Payer: PPO | Admitting: Oncology

## 2022-05-04 ENCOUNTER — Ambulatory Visit: Payer: PPO

## 2022-05-18 NOTE — Progress Notes (Signed)
[place]  [phone] Fax:(336) [phone]  ID: [name] OB: 1946/08/05  MR#: [id number]  SWN#:[id number]  Patient Care Team: Baxter Hire, MD as PCP - General (Internal Medicine) Lloyd Huger, MD as Consulting Physician (Hematology and Oncology)  CHIEF COMPLAINT: Iron deficiency anemia.  INTERVAL HISTORY: Patient returns to clinic today for repeat laboratory work, further evaluation, and consideration of additional IV Feraheme.  Patient states he gets significantly fatigue mid-to-late afternoon, but otherwise feels well.  He currently feels well and is asymptomatic.  He has no neurologic complaints.  He denies any recent fevers or illnesses.  He has a good appetite and denies weight loss.  He denies any chest pain, shortness of breath, cough, or hemoptysis.  He denies any nausea, vomiting, constipation, or diarrhea.  He has no melena or hematochezia.  He has no urinary complaints.  Patient offers no further specific complaints today.    REVIEW OF SYSTEMS:   Review of Systems  Constitutional: Negative.  Negative for fever, malaise/fatigue and weight loss.  Respiratory: Negative.  Negative for cough and shortness of breath.   Cardiovascular: Negative.  Negative for chest pain and leg swelling.  Gastrointestinal: Negative.  Negative for abdominal pain, blood in stool and melena.  Genitourinary: Negative.  Negative for dysuria and hematuria.  Musculoskeletal: Negative.  Negative for back pain.  Skin: Negative.  Negative for rash.  Neurological: Negative.  Negative for dizziness, focal weakness, weakness and headaches.  Psychiatric/Behavioral: Negative.  The patient is not nervous/anxious.     As per HPI. Otherwise, a complete review of systems is negative.  PAST MEDICAL HISTORY: Past Medical History:  Diagnosis Date   Arthritis    Coronary artery disease    GERD (gastroesophageal reflux disease)    Hyperlipidemia    Hypertension     Mitral valve disorder    Peripheral vascular disease (HCC)    Sleep apnea    use C-PAP    PAST SURGICAL HISTORY: Past Surgical History:  Procedure Laterality Date   BROW LIFT Bilateral 07/24/2020   Procedure: BLEPHAROPLASTY UPPER EYELID; W/EXCESS SKIN BILATERAL;  Surgeon: Karle Starch, MD;  Location: [facility];  Service: Ophthalmology;  Laterality: Bilateral;  sleep apnea   CAROTID ENDARTERECTOMY Bilateral    Dr. Delana Meyer, [place] Left 12/23/2016   Procedure: CARPAL TUNNEL RELEASE ENDOSCOPIC;  Surgeon: Corky Mull, MD;  Location: [facility];  Service: Orthopedics;  Laterality: Left;   CARPAL TUNNEL RELEASE Right 01/13/2017   Procedure: CARPAL TUNNEL RELEASE ENDOSCOPIC;  Surgeon: Corky Mull, MD;  Location: [facility];  Service: Orthopedics;  Laterality: Right;   COLONOSCOPY WITH PROPOFOL N/A 09/04/2018   Procedure: COLONOSCOPY WITH PROPOFOL;  Surgeon: Manya Silvas, MD;  Location: [facility] ENDOSCOPY;  Service: Endoscopy;  Laterality: N/A;   CORONARY ANGIOPLASTY     3 stents   coronary atherosclerosis of  autologous graft     ESOPHAGOGASTRODUODENOSCOPY (EGD) WITH PROPOFOL N/A 12/12/2017   Procedure: ESOPHAGOGASTRODUODENOSCOPY (EGD) WITH PROPOFOL;  Surgeon: Manya Silvas, MD;  Location: [facility] ENDOSCOPY;  Service: Endoscopy;  Laterality: N/A;   ESOPHAGOGASTRODUODENOSCOPY (EGD) WITH PROPOFOL N/A 09/04/2018   Procedure: ESOPHAGOGASTRODUODENOSCOPY (EGD) WITH PROPOFOL;  Surgeon: Manya Silvas, MD;  Location: [facility] ENDOSCOPY;  Service: Endoscopy;  Laterality: N/A;   EYE SURGERY Bilateral    Cataract Extraction with IOL   FRACTURE SURGERY Right    Hip Pinning, Dr. Franchot Mimes, Jr   JOINT REPLACEMENT     left  knee   KNEE ARTHROPLASTY Left 06/30/2016   Procedure: COMPUTER ASSISTED TOTAL KNEE ARTHROPLASTY;  Surgeon: Dereck Leep, MD;  Location: [facility];  Service: Orthopedics;  Laterality: Left;   LOWER EXTREMITY ANGIOGRAPHY Left 05/06/2020   Procedure:  LOWER EXTREMITY ANGIOGRAPHY;  Surgeon: Katha Cabal, MD;  Location: [place] CV LAB;  Service: Cardiovascular;  Laterality: Left;   MASS EXCISION Left 01/11/2018   Procedure: EXCISION CYSTIC MASS ANTERIOR KNEE;  Surgeon: Dereck Leep, MD;  Location: [facility];  Service: Orthopedics;  Laterality: Left;    FAMILY HISTORY: History reviewed. No pertinent family history.  ADVANCED DIRECTIVES (Y/N):  N  HEALTH MAINTENANCE: Social History   Tobacco Use   Smoking status: Every Day    Packs/day: 0.50    Years: 60.00    Total pack years: 30.00    Types: Cigarettes   Smokeless tobacco: Never   Tobacco comments:    since age 42  Vaping Use   Vaping Use: Never used  Substance Use Topics   Alcohol use: Yes    Alcohol/week: 4.0 standard drinks of alcohol    Types: 2 Cans of beer, 2 Standard drinks or equivalent per week    Comment: 4/ week   Drug use: No     Colonoscopy:  PAP:  Bone density:  Lipid panel:  No Known Allergies  Current Outpatient Medications  Medication Sig Dispense Refill   Cholecalciferol (VITAMIN D) 2000 units CAPS Take 2,000 Units by mouth every evening.     clopidogrel (PLAVIX) 75 MG tablet Take 75 mg by mouth daily.     enalapril (VASOTEC) 20 MG tablet Take 10 mg by mouth 2 (two) times daily.     fenofibrate micronized (LOFIBRA) 134 MG capsule Take 134 mg by mouth daily before breakfast.      Glucosamine HCl (GLUCOSAMINE PO) Take 1 tablet by mouth 2 (two) times daily.     Multiple Vitamins-Minerals (PX COMPLETE SENIOR MULTIVITS) TABS Take 1 tablet by mouth.     Omega-3 Fatty Acids (OMEGA-3 FISH OIL) 300 MG CAPS Take 300 mg by mouth 2 (two) times daily.     pantoprazole (PROTONIX) 20 MG tablet Take 20 mg by mouth every morning.     sildenafil (REVATIO) 20 MG tablet Take 60-100 mg by mouth daily as needed (for erectile dysfunction). Pt states 2 morning and night     simvastatin (ZOCOR) 40 MG tablet Take 40 mg by mouth daily at 6 PM.      tamsulosin  (FLOMAX) 0.4 MG CAPS capsule Take 1 capsule (0.4 mg total) by mouth daily. 30 capsule 11   vitamin B-12 (CYANOCOBALAMIN) 1000 MCG tablet Take 1,000 mcg by mouth daily.     vitamin E 400 UNIT capsule Take 400 Units by mouth every evening.      No current facility-administered medications for this visit.    OBJECTIVE: Vitals:   05/20/22 1256  BP: 136/67  Pulse: 67  Temp: 98.7 F (37.1 C)     Body mass index is 24.97 kg/m.    ECOG FS:0 - Asymptomatic  General: Well-developed, well-nourished, no acute distress. Eyes: Pink conjunctiva, anicteric sclera. HEENT: Normocephalic, moist mucous membranes. Lungs: No audible wheezing or coughing. Heart: Regular rate and rhythm. Abdomen: Soft, nontender, no obvious distention. Musculoskeletal: No edema, cyanosis, or clubbing. Neuro: Alert, answering all questions appropriately. Cranial nerves grossly intact. Skin: No rashes or petechiae noted. Psych: Normal affect.   LAB RESULTS:  Lab Results  Component Value Date   NA  137 12/30/2017   K 4.0 12/30/2017   CL 105 12/30/2017   CO2 24 12/30/2017   GLUCOSE 89 12/30/2017   BUN 13 05/06/2020   CREATININE 1.00 05/06/2020   CALCIUM 9.4 12/30/2017   PROT 7.5 06/16/2016   ALBUMIN 4.4 06/16/2016   AST 26 06/16/2016   ALT 16 (L) 06/16/2016   ALKPHOS 39 06/16/2016   BILITOT 0.6 06/16/2016   GFRNONAA >60 05/06/2020   GFRAA >60 05/06/2020    Lab Results  Component Value Date   WBC 7.4 05/19/2022   NEUTROABS 4.6 05/19/2022   HGB 14.9 05/19/2022   HCT 43.4 05/19/2022   MCV 89.3 05/19/2022   PLT 221 05/19/2022   Lab Results  Component Value Date   IRON 78 05/19/2022   TIBC 421 05/19/2022   IRONPCTSAT 19 05/19/2022   Lab Results  Component Value Date   FERRITIN 17 (L) 05/19/2022     STUDIES: No results found.  ASSESSMENT: Iron deficiency anemia.  PLAN:   1.  Iron deficiency anemia: Although patient's hemoglobin continues to be within normal limits, his ferritin level has  trended down to 17 and he is mildly symptomatic.  Previously, colonoscopy and upper endoscopy on September 04, 2018 did not reveal any significant pathology.  Proceed with 1 dose of 510 mg IV Feraheme today.  Return to clinic in 4 months with laboratory work on day 1 and then evaluation by APP and consideration of additional Feraheme on day 2.  I spent a total of 30 minutes reviewing chart data, face-to-face evaluation with the patient, counseling and coordination of care as detailed above.   Patient expressed understanding and was in agreement with this plan. He also understands that He can call clinic at any time with any questions, concerns, or complaints.    Lloyd Huger, MD   05/21/2022 6:14 AM

## 2022-05-19 ENCOUNTER — Inpatient Hospital Stay: Payer: PPO | Attending: Oncology

## 2022-05-19 DIAGNOSIS — D509 Iron deficiency anemia, unspecified: Secondary | ICD-10-CM | POA: Diagnosis not present

## 2022-05-19 LAB — CBC WITH DIFFERENTIAL/PLATELET
Abs Immature Granulocytes: 0.05 10*3/uL (ref 0.00–0.07)
Basophils Absolute: 0.1 10*3/uL (ref 0.0–0.1)
Basophils Relative: 1 %
Eosinophils Absolute: 0.2 10*3/uL (ref 0.0–0.5)
Eosinophils Relative: 3 %
HCT: 43.4 % (ref 39.0–52.0)
Hemoglobin: 14.9 g/dL (ref 13.0–17.0)
Immature Granulocytes: 1 %
Lymphocytes Relative: 23 %
Lymphs Abs: 1.7 10*3/uL (ref 0.7–4.0)
MCH: 30.7 pg (ref 26.0–34.0)
MCHC: 34.3 g/dL (ref 30.0–36.0)
MCV: 89.3 fL (ref 80.0–100.0)
Monocytes Absolute: 0.7 10*3/uL (ref 0.1–1.0)
Monocytes Relative: 10 %
Neutro Abs: 4.6 10*3/uL (ref 1.7–7.7)
Neutrophils Relative %: 62 %
Platelets: 221 10*3/uL (ref 150–400)
RBC: 4.86 MIL/uL (ref 4.22–5.81)
RDW: 13.7 % (ref 11.5–15.5)
WBC: 7.4 10*3/uL (ref 4.0–10.5)
nRBC: 0 % (ref 0.0–0.2)

## 2022-05-19 LAB — IRON AND TIBC
Iron: 78 ug/dL (ref 45–182)
Saturation Ratios: 19 % (ref 17.9–39.5)
TIBC: 421 ug/dL (ref 250–450)
UIBC: 343 ug/dL

## 2022-05-19 LAB — FERRITIN: Ferritin: 17 ng/mL — ABNORMAL LOW (ref 24–336)

## 2022-05-20 ENCOUNTER — Inpatient Hospital Stay: Payer: PPO

## 2022-05-20 ENCOUNTER — Inpatient Hospital Stay (HOSPITAL_BASED_OUTPATIENT_CLINIC_OR_DEPARTMENT_OTHER): Payer: PPO | Admitting: Oncology

## 2022-05-20 ENCOUNTER — Encounter: Payer: Self-pay | Admitting: Oncology

## 2022-05-20 VITALS — BP 136/67 | HR 67 | Temp 98.7°F | Ht 70.0 in | Wt 174.0 lb

## 2022-05-20 DIAGNOSIS — D509 Iron deficiency anemia, unspecified: Secondary | ICD-10-CM

## 2022-05-20 MED ORDER — SODIUM CHLORIDE 0.9 % IV SOLN
INTRAVENOUS | Status: DC
  Filled 2022-05-20: qty 250

## 2022-05-20 MED ORDER — SODIUM CHLORIDE 0.9 % IV SOLN
510.0000 mg | Freq: Once | INTRAVENOUS | Status: AC
  Administered 2022-05-20: 510 mg via INTRAVENOUS
  Filled 2022-05-20: qty 510

## 2022-05-20 NOTE — Patient Instructions (Signed)
[facility]  Discharge Instructions: Thank you for choosing [facility] to provide your oncology and hematology care.  If you have a lab appointment with the Cancer Center, please go directly to the Cancer Center and check in at the registration area.  Wear comfortable clothing and clothing appropriate for easy access to any Portacath or PICC line.   We strive to give you quality time with your provider. You may need to reschedule your appointment if you arrive late (15 or more minutes).  Arriving late affects you and other patients whose appointments are after yours.  Also, if you miss three or more appointments without notifying the office, you may be dismissed from the clinic at the provider's discretion.      For prescription refill requests, have your pharmacy contact our office and allow 72 hours for refills to be completed.    Today you received the following chemotherapy and/or immunotherapy agents Feraheme.      To help prevent nausea and vomiting after your treatment, we encourage you to take your nausea medication as directed.  BELOW ARE SYMPTOMS THAT SHOULD BE REPORTED IMMEDIATELY: *FEVER GREATER THAN 100.4 F (38 C) OR HIGHER *CHILLS OR SWEATING *NAUSEA AND VOMITING THAT IS NOT CONTROLLED WITH YOUR NAUSEA MEDICATION *UNUSUAL SHORTNESS OF BREATH *UNUSUAL BRUISING OR BLEEDING *URINARY PROBLEMS (pain or burning when urinating, or frequent urination) *BOWEL PROBLEMS (unusual diarrhea, constipation, pain near the anus) TENDERNESS IN MOUTH AND THROAT WITH OR WITHOUT PRESENCE OF ULCERS (sore throat, sores in mouth, or a toothache) UNUSUAL RASH, SWELLING OR PAIN  UNUSUAL VAGINAL DISCHARGE OR ITCHING   Items with * indicate a potential emergency and should be followed up as soon as possible or go to the Emergency Department if any problems should occur.  Please show the CHEMOTHERAPY ALERT CARD or IMMUNOTHERAPY ALERT CARD at check-in to  the Emergency Department and triage nurse.  Should you have questions after your visit or need to cancel or reschedule your appointment, please contact [facility]  [phone] and follow the prompts.  Office hours are 8:00 a.m. to 4:30 p.m. Monday - Friday. Please note that voicemails left after 4:00 p.m. may not be returned until the following business day.  We are closed weekends and major holidays. You have access to a nurse at all times for urgent questions. Please call the main number to the clinic [phone] and follow the prompts.  For any non-urgent questions, you may also contact your provider using MyChart. We now offer e-Visits for anyone 18 and older to request care online for non-urgent symptoms. For details visit [email or web address].   Also download the MyChart app! Go to the app store, search "MyChart", open the app, select [facility], and log in with your MyChart username and password.  Masks are optional in the cancer centers. If you would like for your care team to wear a mask while they are taking care of you, please let them know. For doctor visits, patients may have with them one support person who is at least 75 years old. At this time, visitors are not allowed in the infusion area.   

## 2022-05-21 ENCOUNTER — Encounter: Payer: Self-pay | Admitting: Oncology

## 2022-05-21 ENCOUNTER — Other Ambulatory Visit (INDEPENDENT_AMBULATORY_CARE_PROVIDER_SITE_OTHER): Payer: Self-pay | Admitting: Nurse Practitioner

## 2022-05-21 DIAGNOSIS — I739 Peripheral vascular disease, unspecified: Secondary | ICD-10-CM

## 2022-05-24 ENCOUNTER — Encounter (INDEPENDENT_AMBULATORY_CARE_PROVIDER_SITE_OTHER): Payer: Self-pay | Admitting: Vascular Surgery

## 2022-05-24 ENCOUNTER — Ambulatory Visit (INDEPENDENT_AMBULATORY_CARE_PROVIDER_SITE_OTHER): Payer: PPO | Admitting: Vascular Surgery

## 2022-05-24 ENCOUNTER — Ambulatory Visit (INDEPENDENT_AMBULATORY_CARE_PROVIDER_SITE_OTHER): Payer: PPO

## 2022-05-24 VITALS — BP 166/89 | HR 69 | Resp 18 | Ht 70.0 in | Wt 173.0 lb

## 2022-05-24 DIAGNOSIS — E782 Mixed hyperlipidemia: Secondary | ICD-10-CM

## 2022-05-24 DIAGNOSIS — I70213 Atherosclerosis of native arteries of extremities with intermittent claudication, bilateral legs: Secondary | ICD-10-CM

## 2022-05-24 DIAGNOSIS — I739 Peripheral vascular disease, unspecified: Secondary | ICD-10-CM

## 2022-05-24 DIAGNOSIS — I25118 Atherosclerotic heart disease of native coronary artery with other forms of angina pectoris: Secondary | ICD-10-CM | POA: Diagnosis not present

## 2022-05-24 DIAGNOSIS — Z9889 Other specified postprocedural states: Secondary | ICD-10-CM

## 2022-05-24 DIAGNOSIS — I10 Essential (primary) hypertension: Secondary | ICD-10-CM

## 2022-05-24 DIAGNOSIS — I6523 Occlusion and stenosis of bilateral carotid arteries: Secondary | ICD-10-CM

## 2022-05-24 NOTE — Progress Notes (Signed)
MRN : [id number]  Ivan Reyes is a 76 y.o. (09-18-1945) male who presents with chief complaint of check circulation.  History of Present Illness:   The patient returns to the office for followup and review of the noninvasive studies. There have been no interval changes in lower extremity symptoms. No interval shortening of the patient's claudication distance or development of rest pain symptoms.  Patient notes that he is able to bike about 4.5 miles on a regular basis.  No new ulcers or wounds have occurred since the last visit.   There have been no significant changes to the patient's overall health care.   The patient denies amaurosis fugax or recent TIA symptoms. There are no recent neurological changes noted. The patient denies history of DVT, PE or superficial thrombophlebitis. The patient denies recent episodes of angina or shortness of breath.    ABI Rt=0.95 and Lt=0.84  (previous ABI's Rt=1.07 and Lt=0.61) Duplex ultrasound of the right lower extremity shows biphasic/triphasic waveforms with good toe waveforms.  The left lower extremity had an arterial duplex which reveals triphasic waveforms in the common femoral as well as deep femoral artery beginning at the proximal SFA stents are placed it transitions to monophasic waveforms throughout the tibials.  There is noted 50 to 74% stenosis at the distal SFA which is likely an area of stenosis and an older stent.  Current Meds  Medication Sig   Cholecalciferol (VITAMIN D) 2000 units CAPS Take 2,000 Units by mouth every evening.   clopidogrel (PLAVIX) 75 MG tablet Take 75 mg by mouth daily.   enalapril (VASOTEC) 20 MG tablet Take 10 mg by mouth 2 (two) times daily.   fenofibrate micronized (LOFIBRA) 134 MG capsule Take 134 mg by mouth daily before breakfast.    finasteride (PROSCAR) 5 MG tablet Take 5 mg by mouth daily.   Glucosamine HCl (GLUCOSAMINE PO) Take 1 tablet by mouth 2 (two) times daily.   Multiple  Vitamins-Minerals (PX COMPLETE SENIOR MULTIVITS) TABS Take 1 tablet by mouth.   Omega-3 Fatty Acids (OMEGA-3 FISH OIL) 300 MG CAPS Take 300 mg by mouth 2 (two) times daily.   pantoprazole (PROTONIX) 20 MG tablet Take 20 mg by mouth every morning.   sildenafil (REVATIO) 20 MG tablet Take 60-100 mg by mouth daily as needed (for erectile dysfunction). Pt states 2 morning and night   simvastatin (ZOCOR) 40 MG tablet Take 40 mg by mouth daily at 6 PM.    tamsulosin (FLOMAX) 0.4 MG CAPS capsule Take 1 capsule (0.4 mg total) by mouth daily.   vitamin B-12 (CYANOCOBALAMIN) 1000 MCG tablet Take 1,000 mcg by mouth daily.   vitamin E 400 UNIT capsule Take 400 Units by mouth every evening.     Past Medical History:  Diagnosis Date   Arthritis    Coronary artery disease    GERD (gastroesophageal reflux disease)    Hyperlipidemia    Hypertension    Mitral valve disorder    Peripheral vascular disease (HCC)    Sleep apnea    use C-PAP    Past Surgical History:  Procedure Laterality Date   BROW LIFT Bilateral 07/24/2020   Procedure: BLEPHAROPLASTY UPPER EYELID; W/EXCESS SKIN BILATERAL;  Surgeon: Karle Starch, MD;  Location: [facility];  Service: Ophthalmology;  Laterality: Bilateral;  sleep apnea   CAROTID ENDARTERECTOMY Bilateral    Dr. Delana Meyer, [facility]   CARPAL TUNNEL RELEASE Left 12/23/2016   Procedure: CARPAL TUNNEL RELEASE ENDOSCOPIC;  Surgeon: Corky Mull,  MD;  Location: [facility];  Service: Orthopedics;  Laterality: Left;   CARPAL TUNNEL RELEASE Right 01/13/2017   Procedure: CARPAL TUNNEL RELEASE ENDOSCOPIC;  Surgeon: Corky Mull, MD;  Location: [facility];  Service: Orthopedics;  Laterality: Right;   COLONOSCOPY WITH PROPOFOL N/A 09/04/2018   Procedure: COLONOSCOPY WITH PROPOFOL;  Surgeon: Manya Silvas, MD;  Location: [facility] ENDOSCOPY;  Service: Endoscopy;  Laterality: N/A;   CORONARY ANGIOPLASTY     3 stents   coronary atherosclerosis of  autologous graft      ESOPHAGOGASTRODUODENOSCOPY (EGD) WITH PROPOFOL N/A 12/12/2017   Procedure: ESOPHAGOGASTRODUODENOSCOPY (EGD) WITH PROPOFOL;  Surgeon: Manya Silvas, MD;  Location: [facility] ENDOSCOPY;  Service: Endoscopy;  Laterality: N/A;   ESOPHAGOGASTRODUODENOSCOPY (EGD) WITH PROPOFOL N/A 09/04/2018   Procedure: ESOPHAGOGASTRODUODENOSCOPY (EGD) WITH PROPOFOL;  Surgeon: Manya Silvas, MD;  Location: [facility] ENDOSCOPY;  Service: Endoscopy;  Laterality: N/A;   EYE SURGERY Bilateral    Cataract Extraction with IOL   FRACTURE SURGERY Right    Hip Pinning, Dr. Franchot Mimes, Jr   JOINT REPLACEMENT     left knee   KNEE ARTHROPLASTY Left 06/30/2016   Procedure: COMPUTER ASSISTED TOTAL KNEE ARTHROPLASTY;  Surgeon: Dereck Leep, MD;  Location: [facility];  Service: Orthopedics;  Laterality: Left;   LOWER EXTREMITY ANGIOGRAPHY Left 05/06/2020   Procedure: LOWER EXTREMITY ANGIOGRAPHY;  Surgeon: Katha Cabal, MD;  Location: [place] CV LAB;  Service: Cardiovascular;  Laterality: Left;   MASS EXCISION Left 01/11/2018   Procedure: EXCISION CYSTIC MASS ANTERIOR KNEE;  Surgeon: Dereck Leep, MD;  Location: [facility];  Service: Orthopedics;  Laterality: Left;    Social History Social History   Tobacco Use   Smoking status: Every Day    Packs/day: 0.50    Years: 60.00    Total pack years: 30.00    Types: Cigarettes   Smokeless tobacco: Never   Tobacco comments:    since age 89  Vaping Use   Vaping Use: Never used  Substance Use Topics   Alcohol use: Yes    Alcohol/week: 4.0 standard drinks of alcohol    Types: 2 Cans of beer, 2 Standard drinks or equivalent per week    Comment: 4/ week   Drug use: No    Family History No family history on file.  No Known Allergies   REVIEW OF SYSTEMS (Negative unless checked)  Constitutional: '[]'$ Weight loss  '[]'$ Fever  '[]'$ Chills Cardiac: '[]'$ Chest pain   '[]'$ Chest pressure   '[]'$ Palpitations   '[]'$ Shortness of breath when laying flat   '[]'$ Shortness of breath with  exertion. Vascular:  '[x]'$ Pain in legs with walking   '[]'$ Pain in legs at rest  '[]'$ History of DVT   '[]'$ Phlebitis   '[]'$ Swelling in legs   '[]'$ Varicose veins   '[]'$ Non-healing ulcers Pulmonary:   '[]'$ Uses home oxygen   '[]'$ Productive cough   '[]'$ Hemoptysis   '[]'$ Wheeze  '[]'$ COPD   '[]'$ Asthma Neurologic:  '[]'$ Dizziness   '[]'$ Seizures   '[]'$ History of stroke   '[]'$ History of TIA  '[]'$ Aphasia   '[]'$ Vissual changes   '[]'$ Weakness or numbness in arm   '[]'$ Weakness or numbness in leg Musculoskeletal:   '[]'$ Joint swelling   '[]'$ Joint pain   '[]'$ Low back pain Hematologic:  '[]'$ Easy bruising  '[]'$ Easy bleeding   '[]'$ Hypercoagulable state   '[]'$ Anemic Gastrointestinal:  '[]'$ Diarrhea   '[]'$ Vomiting  '[]'$ Gastroesophageal reflux/heartburn   '[]'$ Difficulty swallowing. Genitourinary:  '[]'$ Chronic kidney disease   '[]'$ Difficult urination  '[]'$ Frequent urination   '[]'$ Blood in urine Skin:  '[]'$ Rashes   '[]'$ Ulcers  Psychological:  '[]'$ History of anxiety   '[]'$  History of major depression.  Physical Examination  Vitals:   05/24/22 0946  BP: (!) 166/89  Pulse: 69  Resp: 18  Weight: 173 lb (78.5 kg)  Height: '5\' 10"'$  (1.778 m)   Body mass index is 24.82 kg/m. Gen: WD/WN, NAD Head: [place]/AT, No temporalis wasting.  Ear/Nose/Throat: Hearing grossly intact, nares w/o erythema or drainage Eyes: PER, EOMI, sclera nonicteric.  Neck: Supple, no masses.  No bruit or JVD.  Pulmonary:  Good air movement, no audible wheezing, no use of accessory muscles.  Cardiac: RRR, normal S1, S2, no Murmurs. Vascular:  mild trophic changes, no open wounds Vessel Right Left  Radial Palpable Palpable  PT Not Palpable Not Palpable  DP Not Palpable Not Palpable  Gastrointestinal: soft, non-distended. No guarding/no peritoneal signs.  Musculoskeletal: M/S 5/5 throughout.  No visible deformity.  Neurologic: CN 2-12 intact. Pain and light touch intact in extremities.  Symmetrical.  Speech is fluent. Motor exam as listed above. Psychiatric: Judgment intact, Mood & affect appropriate for pt's clinical  situation. Dermatologic: No rashes or ulcers noted.  No changes consistent with cellulitis.   CBC Lab Results  Component Value Date   WBC 7.4 05/19/2022   HGB 14.9 05/19/2022   HCT 43.4 05/19/2022   MCV 89.3 05/19/2022   PLT 221 05/19/2022    BMET    Component Value Date/Time   NA 137 12/30/2017 1150   K 4.0 12/30/2017 1150   K 4.1 06/28/2014 1603   CL 105 12/30/2017 1150   CO2 24 12/30/2017 1150   GLUCOSE 89 12/30/2017 1150   BUN 13 05/06/2020 1137   CREATININE 1.00 05/06/2020 1137   CALCIUM 9.4 12/30/2017 1150   GFRNONAA >60 05/06/2020 1137   GFRAA >60 05/06/2020 1137   CrCl cannot be calculated (Patient's most recent lab result is older than the maximum 21 days allowed.).  COAG Lab Results  Component Value Date   INR 0.92 06/16/2016    Radiology No results found.   Assessment/Plan 1. Atherosclerosis of native artery of both lower extremities with intermittent claudication (HCC)  Recommend:  The patient has evidence of atherosclerosis of the lower extremities with claudication.  The patient does not voice lifestyle limiting changes at this point in time.  Noninvasive studies do not suggest clinically significant change.  No invasive studies, angiography or surgery at this time The patient should continue walking and begin a more formal exercise program.  The patient should continue antiplatelet therapy and aggressive treatment of the lipid abnormalities  No changes in the patient's medications at this time  Continued surveillance is indicated as atherosclerosis is likely to progress with time.    The patient will continue follow up with noninvasive studies as ordered.   - VAS [place] ABI WITH/WO TBI; Future  2. Bilateral carotid artery stenosis Recommend:  Given the patient's asymptomatic subcritical stenosis no further invasive testing or surgery at this time.  Duplex ultrasound shows RICA 57-01% and LICA 7-79% stenosis.  Continue antiplatelet therapy  as prescribed Continue management of CAD, HTN and Hyperlipidemia Healthy heart diet,  encouraged exercise at least 4 times per week Follow up in 6 months with duplex ultrasound and physical exam    - VAS US CAROTID; Future  3. Primary hypertension Continue antihypertensive medications as already ordered, these medications have been reviewed and there are no changes at this time.   4. Coronary artery disease of native artery of native heart with stable angina pectoris ([place])  Continue cardiac and antihypertensive medications as already ordered and reviewed, no changes at this time.  Continue statin as ordered and reviewed, no changes at this time  Nitrates PRN for chest pain   5. Mixed hyperlipidemia Continue statin as ordered and reviewed, no changes at this time      Hortencia Pilar, MD  05/24/2022 10:03 AM

## 2022-05-29 ENCOUNTER — Encounter (INDEPENDENT_AMBULATORY_CARE_PROVIDER_SITE_OTHER): Payer: Self-pay | Admitting: Vascular Surgery

## 2022-06-02 DIAGNOSIS — F17209 Nicotine dependence, unspecified, with unspecified nicotine-induced disorders: Secondary | ICD-10-CM | POA: Diagnosis not present

## 2022-06-02 DIAGNOSIS — Z86010 Personal history of colonic polyps: Secondary | ICD-10-CM | POA: Diagnosis not present

## 2022-06-02 DIAGNOSIS — K2270 Barrett's esophagus without dysplasia: Secondary | ICD-10-CM | POA: Diagnosis not present

## 2022-06-02 DIAGNOSIS — K224 Dyskinesia of esophagus: Secondary | ICD-10-CM | POA: Diagnosis not present

## 2022-06-02 DIAGNOSIS — D500 Iron deficiency anemia secondary to blood loss (chronic): Secondary | ICD-10-CM | POA: Diagnosis not present

## 2022-06-02 DIAGNOSIS — K5900 Constipation, unspecified: Secondary | ICD-10-CM | POA: Diagnosis not present

## 2022-06-11 ENCOUNTER — Encounter: Payer: Self-pay | Admitting: Internal Medicine

## 2022-06-11 ENCOUNTER — Other Ambulatory Visit: Payer: Self-pay

## 2022-06-11 ENCOUNTER — Observation Stay
Admission: EM | Admit: 2022-06-11 | Discharge: 2022-06-12 | Discharge status: Against Medical Advice | Payer: PPO | Attending: Internal Medicine | Admitting: Internal Medicine

## 2022-06-11 DIAGNOSIS — Z7902 Long term (current) use of antithrombotics/antiplatelets: Secondary | ICD-10-CM | POA: Diagnosis not present

## 2022-06-11 DIAGNOSIS — D509 Iron deficiency anemia, unspecified: Secondary | ICD-10-CM | POA: Insufficient documentation

## 2022-06-11 DIAGNOSIS — K921 Melena: Principal | ICD-10-CM | POA: Diagnosis present

## 2022-06-11 DIAGNOSIS — Z79899 Other long term (current) drug therapy: Secondary | ICD-10-CM | POA: Insufficient documentation

## 2022-06-11 DIAGNOSIS — F17210 Nicotine dependence, cigarettes, uncomplicated: Secondary | ICD-10-CM | POA: Insufficient documentation

## 2022-06-11 DIAGNOSIS — K922 Gastrointestinal hemorrhage, unspecified: Secondary | ICD-10-CM | POA: Diagnosis not present

## 2022-06-11 DIAGNOSIS — Z96652 Presence of left artificial knee joint: Secondary | ICD-10-CM | POA: Insufficient documentation

## 2022-06-11 DIAGNOSIS — I2510 Atherosclerotic heart disease of native coronary artery without angina pectoris: Secondary | ICD-10-CM | POA: Insufficient documentation

## 2022-06-11 DIAGNOSIS — I10 Essential (primary) hypertension: Secondary | ICD-10-CM | POA: Insufficient documentation

## 2022-06-11 DIAGNOSIS — Z955 Presence of coronary angioplasty implant and graft: Secondary | ICD-10-CM | POA: Diagnosis not present

## 2022-06-11 LAB — CBC WITH DIFFERENTIAL/PLATELET
Abs Immature Granulocytes: 0.23 10*3/uL — ABNORMAL HIGH (ref 0.00–0.07)
Basophils Absolute: 0.1 10*3/uL (ref 0.0–0.1)
Basophils Relative: 1 %
Eosinophils Absolute: 0.2 10*3/uL (ref 0.0–0.5)
Eosinophils Relative: 2 %
HCT: 30.9 % — ABNORMAL LOW (ref 39.0–52.0)
Hemoglobin: 10.4 g/dL — ABNORMAL LOW (ref 13.0–17.0)
Immature Granulocytes: 2 %
Lymphocytes Relative: 16 %
Lymphs Abs: 1.8 10*3/uL (ref 0.7–4.0)
MCH: 30.7 pg (ref 26.0–34.0)
MCHC: 33.7 g/dL (ref 30.0–36.0)
MCV: 91.2 fL (ref 80.0–100.0)
Monocytes Absolute: 0.7 10*3/uL (ref 0.1–1.0)
Monocytes Relative: 6 %
Neutro Abs: 8 10*3/uL — ABNORMAL HIGH (ref 1.7–7.7)
Neutrophils Relative %: 73 %
Platelets: 253 10*3/uL (ref 150–400)
RBC: 3.39 MIL/uL — ABNORMAL LOW (ref 4.22–5.81)
RDW: 13.8 % (ref 11.5–15.5)
WBC: 10.9 10*3/uL — ABNORMAL HIGH (ref 4.0–10.5)
nRBC: 0 % (ref 0.0–0.2)

## 2022-06-11 LAB — URINALYSIS, ROUTINE W REFLEX MICROSCOPIC
Bacteria, UA: NONE SEEN
Bilirubin Urine: NEGATIVE
Glucose, UA: NEGATIVE mg/dL
Hgb urine dipstick: NEGATIVE
Ketones, ur: NEGATIVE mg/dL
Nitrite: NEGATIVE
Protein, ur: NEGATIVE mg/dL
Specific Gravity, Urine: 1.017 (ref 1.005–1.030)
Squamous Epithelial / HPF: NONE SEEN (ref 0–5)
pH: 6 (ref 5.0–8.0)

## 2022-06-11 LAB — COMPREHENSIVE METABOLIC PANEL
ALT: 15 U/L (ref 0–44)
AST: 29 U/L (ref 15–41)
Albumin: 3.8 g/dL (ref 3.5–5.0)
Alkaline Phosphatase: 47 U/L (ref 38–126)
Anion gap: 9 (ref 5–15)
BUN: 19 mg/dL (ref 8–23)
CO2: 22 mmol/L (ref 22–32)
Calcium: 9.5 mg/dL (ref 8.9–10.3)
Chloride: 109 mmol/L (ref 98–111)
Creatinine, Ser: 0.98 mg/dL (ref 0.61–1.24)
GFR, Estimated: >60 mL/min (ref >60)
Glucose, Bld: 127 mg/dL — ABNORMAL HIGH (ref 70–99)
Potassium: 3.9 mmol/L (ref 3.5–5.1)
Sodium: 140 mmol/L (ref 135–145)
Total Bilirubin: 0.5 mg/dL (ref 0.3–1.2)
Total Protein: 7.1 g/dL (ref 6.5–8.1)

## 2022-06-11 LAB — TYPE AND SCREEN
ABO/RH(D): A POS
Antibody Screen: NEGATIVE

## 2022-06-11 LAB — LIPASE, BLOOD: Lipase: 41 U/L (ref 11–51)

## 2022-06-11 MED ORDER — PANTOPRAZOLE SODIUM 40 MG IV SOLR: 40.0000 mg | Freq: Two times a day (BID) | INTRAVENOUS | Status: DC

## 2022-06-11 MED ORDER — FINASTERIDE 5 MG PO TABS: 5.0000 mg | ORAL_TABLET | Freq: Every day | ORAL | Status: DC

## 2022-06-11 MED ORDER — ONDANSETRON HCL 4 MG PO TABS: 4.0000 mg | ORAL_TABLET | Freq: Four times a day (QID) | ORAL | Status: DC | PRN

## 2022-06-11 MED ORDER — ACETAMINOPHEN 650 MG RE SUPP: 650.0000 mg | Freq: Four times a day (QID) | RECTAL | Status: DC | PRN

## 2022-06-11 MED ORDER — SODIUM CHLORIDE 0.9 % IV SOLN: INTRAVENOUS | Status: DC

## 2022-06-11 MED ORDER — PANTOPRAZOLE 80MG IVPB - SIMPLE MED
80.0000 mg | Freq: Once | INTRAVENOUS | Status: AC
  Administered 2022-06-11: 80 mg via INTRAVENOUS
  Filled 2022-06-11: qty 100

## 2022-06-11 MED ORDER — PANTOPRAZOLE INFUSION (NEW) - SIMPLE MED
8.0000 mg/h | INTRAVENOUS | Status: DC
  Administered 2022-06-11: 8 mg/h via INTRAVENOUS
  Filled 2022-06-11 (×2): qty 100

## 2022-06-11 MED ORDER — DEXTROSE IN LACTATED RINGERS 5 % IV SOLN: INTRAVENOUS | Status: DC

## 2022-06-11 MED ORDER — TAMSULOSIN HCL 0.4 MG PO CAPS: 0.4000 mg | ORAL_CAPSULE | Freq: Every day | ORAL | Status: DC

## 2022-06-11 MED ORDER — SIMVASTATIN 20 MG PO TABS: 40.0000 mg | ORAL_TABLET | Freq: Every day | ORAL | Status: DC

## 2022-06-11 MED ORDER — ONDANSETRON HCL 4 MG/2ML IJ SOLN: 4.0000 mg | Freq: Four times a day (QID) | INTRAMUSCULAR | Status: DC | PRN

## 2022-06-11 MED ORDER — ACETAMINOPHEN 325 MG PO TABS: 650.0000 mg | ORAL_TABLET | Freq: Four times a day (QID) | ORAL | Status: DC | PRN

## 2022-06-11 NOTE — ED Provider Notes (Signed)
[facility] Provider Note    Event Date/Time   First MD Initiated Contact with Patient 06/11/22 1536     (approximate)   History   GI Bleeding   HPI  Ivan Reyes is a 76 y.o. male  who presents to the emergency department today because of concern for black and gooey stools. The patient states that it started around 2 weeks ago. Prior to the black stools he was having diarrhea. The patient denies any shortness of breath or weakness. States that they have thought he had been having a chronic GI bleed for a long time and that he is scheduled for further endoscopic evaluation in a couple of months.       Physical Exam   Triage Vital Signs: ED Triage Vitals  Enc Vitals Group     BP 06/11/22 1400 (!) 129/54     Pulse Rate 06/11/22 1359 70     Resp 06/11/22 1359 18     Temp 06/11/22 1359 98.3 F (36.8 C)     Temp Source 06/11/22 1359 Oral     SpO2 06/11/22 1359 96 %     Weight 06/11/22 1359 173 lb 1 oz (78.5 kg)     Height 06/11/22 1359 '5\' 10"'$  (1.778 m)     Head Circumference --      Peak Flow --      Pain Score 06/11/22 1359 0     Pain Loc --      Pain Edu? --      Excl. in [place]? --     Most recent vital signs: Vitals:   06/11/22 1359 06/11/22 1400  BP:  (!) 129/54  Pulse: 70   Resp: 18   Temp: 98.3 F (36.8 C)   SpO2: 96%    General: Awake, alert, oriented. CV:  Good peripheral perfusion. Regular rate and rhythm. Resp:  Normal effort. Lungs clear. Abd:  No distention.     ED Results / Procedures / Treatments   Labs (all labs ordered are listed, but only abnormal results are displayed) Labs Reviewed  COMPREHENSIVE METABOLIC PANEL - Abnormal; Notable for the following components:      Result Value   Glucose, Bld 127 (*)    All other components within normal limits  CBC WITH DIFFERENTIAL/PLATELET - Abnormal; Notable for the following components:   WBC 10.9 (*)    RBC 3.39 (*)    Hemoglobin 10.4 (*)    HCT 30.9 (*)     Neutro Abs 8.0 (*)    Abs Immature Granulocytes 0.23 (*)    All other components within normal limits  URINALYSIS, ROUTINE W REFLEX MICROSCOPIC - Abnormal; Notable for the following components:   Color, Urine YELLOW (*)    APPearance HAZY (*)    Leukocytes,Ua TRACE (*)    All other components within normal limits  LIPASE, BLOOD     EKG  None   RADIOLOGY None   PROCEDURES:  Critical Care performed: No  Procedures   MEDICATIONS ORDERED IN ED: Medications - No data to display   IMPRESSION / MDM / [place] / ED COURSE  I reviewed the triage vital signs and the nursing notes.                              Differential diagnosis includes, but is not limited to, gi bleed, ulcer, avm.  Patient's presentation is most consistent with acute presentation  with potential threat to life or bodily function.  The patient is on the cardiac monitor to evaluate for evidence of arrhythmia and/or significant heart rate changes.  To the emergency department today because of concerns for black and tarry stools.  Have been happening for about 2 weeks.  Denies any associated abdominal pain.  Blood today does show a hemoglobin of 10.4 which is down from the last test I could find which was 14.9 three weeks ago. Given concern for gi bleed and drop in blood will start protonix and plan on admission. Discussed with Dr. Mal Misty with the hospitalist service who will plan on admission.   FINAL CLINICAL IMPRESSION(S) / ED DIAGNOSES   Final diagnoses:  Gastrointestinal hemorrhage, unspecified gastrointestinal hemorrhage type    Note:  This document was prepared using Dragon voice recognition software and may include unintentional dictation errors.    Nance Pear, MD 06/11/22 1739

## 2022-06-11 NOTE — ED Notes (Signed)
Request made for transport to the floor ?

## 2022-06-11 NOTE — ED Triage Notes (Signed)
First nurse note: Black gooey stools X3 weeks. Denies BM in 3 days. HX iron deficiency anemia

## 2022-06-11 NOTE — Plan of Care (Signed)

## 2022-06-11 NOTE — ED Triage Notes (Signed)
Pt here from [facility] with black stools. Pt states he has not had a BM in 3 days but when he does have them they are black and 'gooey'. Pt also c/o a lot of gas. Pt denies abd pain, N, and V.

## 2022-06-11 NOTE — H&P (Addendum)
History and Physical:    Ivan Reyes   YTK:[id number] DOB: 1946-01-06 DOA: 06/11/2022  Referring MD/provider: Nance Pear, MD PCP: Baxter Hire, MD   Patient coming from: Home  Chief Complaint: Black stools  History of Present Illness:   Ivan Reyes is a 76 y.o. male with medical history significant for CAD with RCA stenting in 2008 on Plavix, PVD with stents in the left lower extremity placed about 10 years ago, iron deficiency anemia from chronic blood loss from GI bleeding requiring IV iron infusion in the past, history of colon polyps, tobacco use disorder, Barrett's esophagus scheduled for repeat EGD on 09/02/2022 by Dr. Virgina Jock.  He said he has had EGD and colonoscopy in the past for GI bleed but the etiology of GI bleed has never been established.  Presented to the hospital today because of "black, gooey stools" for at least 1 to 2 weeks duration.  He is not sure about the timeline because of memory impairment.  No hematemesis, vomiting, abdominal pain, shortness of breath, chest pain, dizziness, syncope or urinary symptoms.  ED Course:  The patient was given IV Protonix in the ED  ROS:   ROS all other systems reviewed were negative  Past Medical History:   Past Medical History:  Diagnosis Date   Arthritis    Coronary artery disease    GERD (gastroesophageal reflux disease)    Hyperlipidemia    Hypertension    Mitral valve disorder    Peripheral vascular disease (HCC)    Sleep apnea    use C-PAP    Past Surgical History:   Past Surgical History:  Procedure Laterality Date   BROW LIFT Bilateral 07/24/2020   Procedure: BLEPHAROPLASTY UPPER EYELID; W/EXCESS SKIN BILATERAL;  Surgeon: Karle Starch, MD;  Location: [place];  Service: Ophthalmology;  Laterality: Bilateral;  sleep apnea   CAROTID ENDARTERECTOMY Bilateral    Dr. Delana Meyer, [place] Left 12/23/2016   Procedure: CARPAL TUNNEL RELEASE ENDOSCOPIC;  Surgeon:  Corky Mull, MD;  Location: [facility];  Service: Orthopedics;  Laterality: Left;   CARPAL TUNNEL RELEASE Right 01/13/2017   Procedure: CARPAL TUNNEL RELEASE ENDOSCOPIC;  Surgeon: Corky Mull, MD;  Location: [facility];  Service: Orthopedics;  Laterality: Right;   COLONOSCOPY WITH PROPOFOL N/A 09/04/2018   Procedure: COLONOSCOPY WITH PROPOFOL;  Surgeon: Manya Silvas, MD;  Location: [facility] END[facility]COPY;  Service: Endoscopy;  Laterality: N/A;   CORONARY ANGIOPLASTY     3 stents   coronary atherosclerosis of  autologous graft     ESOPHAGOGASTRODUODENOSCOPY (EGD) WITH PROPOFOL N/A 12/12/2017   Procedure: ESOPHAGOGASTRODUODENOSCOPY (EGD) WITH PROPOFOL;  Surgeon: Manya Silvas, MD;  Location: [facility] ENDOSCOPY;  Service: Endoscopy;  Laterality: N/A;   ESOPHAGOGASTRODUODENOSCOPY (EGD) WITH PROPOFOL N/A 09/04/2018   Procedure: ESOPHAGOGASTRODUODENOSCOPY (EGD) WITH PROPOFOL;  Surgeon: Manya Silvas, MD;  Location: [facility] ENDOSCOPY;  Service: Endoscopy;  Laterality: N/A;   EYE SURGERY Bilateral    Cataract Extraction with IOL   FRACTURE SURGERY Right    Hip Pinning, Dr. Franchot Mimes, Jr   JOINT REPLACEMENT     left knee   KNEE ARTHROPLASTY Left 06/30/2016   Procedure: COMPUTER ASSISTED TOTAL KNEE ARTHROPLASTY;  Surgeon: Dereck Leep, MD;  Location: [facility];  Service: Orthopedics;  Laterality: Left;   LOWER EXTREMITY ANGIOGRAPHY Left 05/06/2020   Procedure: LOWER EXTREMITY ANGIOGRAPHY;  Surgeon: Katha Cabal, MD;  Location: [place] CV LAB;  Service: Cardiovascular;  Laterality:  Left;   MASS EXCISION Left 01/11/2018   Procedure: EXCISION CYSTIC MASS ANTERIOR KNEE;  Surgeon: Dereck Leep, MD;  Location: [facility];  Service: Orthopedics;  Laterality: Left;    Social History:   Social History   Socioeconomic History   Marital status: Widowed    Spouse name: Not on file   Number of children: Not on file   Years of education: Not on file   Highest education level: Not on file   Occupational History   Not on file  Tobacco Use   Smoking status: Every Day    Packs/day: 0.50    Years: 60.00    Total pack years: 30.00    Types: Cigarettes   Smokeless tobacco: Never   Tobacco comments:    since age 45  Vaping Use   Vaping Use: Never used  Substance and Sexual Activity   Alcohol use: Yes    Alcohol/week: 4.0 standard drinks of alcohol    Types: 2 Cans of beer, 2 Standard drinks or equivalent per week    Comment: 4/ week   Drug use: No   Sexual activity: Not on file  Other Topics Concern   Not on file  Social History Narrative   Not on file   Social Determinants of Health   Financial Resource Strain: Not on file  Food Insecurity: Not on file  Transportation Needs: Not on file  Physical Activity: Not on file  Stress: Not on file  Social Connections: Not on file  Intimate Partner Violence: Not on file    Allergies   Patient has no known allergies.  Family history:   No family history on file.  Current Medications:   Prior to Admission medications   Medication Sig Start Date End Date Taking? Authorizing Provider  Cholecalciferol (VITAMIN D) 2000 units CAPS Take 2,000 Units by mouth every evening.    [provider]  clopidogrel (PLAVIX) 75 MG tablet Take 75 mg by mouth daily.    [provider]  enalapril (VASOTEC) 20 MG tablet Take 10 mg by mouth 2 (two) times daily.    [provider]  fenofibrate micronized (LOFIBRA) 134 MG capsule Take 134 mg by mouth daily before breakfast.     [provider]  finasteride (PROSCAR) 5 MG tablet Take 5 mg by mouth daily. 04/02/22   [provider]  Glucosamine HCl (GLUCOSAMINE PO) Take 1 tablet by mouth 2 (two) times daily.    [provider]  Multiple Vitamins-Minerals (PX COMPLETE SENIOR MULTIVITS) TABS Take 1 tablet by mouth.    [provider]  Omega-3 Fatty Acids (OMEGA-3 FISH OIL) 300 MG CAPS Take 300 mg by mouth 2 (two) times daily.     [provider]  pantoprazole (PROTONIX) 20 MG tablet Take 20 mg by mouth every morning. 10/09/19   [provider]  sildenafil (REVATIO) 20 MG tablet Take 60-100 mg by mouth daily as needed (for erectile dysfunction). Pt states 2 morning and night    [provider]  simvastatin (ZOCOR) 40 MG tablet Take 40 mg by mouth daily at 6 PM.     [provider]  tamsulosin (FLOMAX) 0.4 MG CAPS capsule Take 1 capsule (0.4 mg total) by mouth daily. 12/11/20   Billey Co, MD  vitamin B-12 (CYANOCOBALAMIN) 1000 MCG tablet Take 1,000 mcg by mouth daily.    [provider]  vitamin E 400 UNIT capsule Take 400 Units by mouth every evening.     [provider]    Physical Exam:   Vitals:   06/11/22 1400 06/11/22 1600 06/11/22 1645 06/11/22 1657  BP: (!) 129/54 (!) 125/49  (!) 120/53  Pulse:  62 61 60  Resp:    18  Temp:    98.2 F (36.8 C)  TempSrc:    Oral  SpO2:  99% 98%   Weight:      Height:         Physical Exam: Blood pressure (!) 120/53, pulse 60, temperature 98.2 F (36.8 C), temperature source Oral, resp. rate 18, height '5\' 10"'$  (1.778 m), weight 78.5 kg, SpO2 98 %. Gen: No acute distress. Head: Normocephalic, atraumatic. Eyes: Pupils equal, round and reactive to light. Extraocular movements intact.  Sclerae nonicteric.  Mouth: Moist mucous membranes Neck: Supple, no thyromegaly, no lymphadenopathy, no jugular venous distention. Chest: Lungs are clear to auscultation with good air movement. No rales, rhonchi or wheezes.  CV: Heart sounds are regular with an S1, S2. No murmurs, rubs or gallops.  Abdomen: Soft, nontender, nondistended with normal active bowel sounds. No palpable masses. Extremities: Extremities are without clubbing, or cyanosis. No edema.  Unable to palpate pedal pulses bilaterally Skin: Warm and dry. No rashes, lesions or wounds Neuro: Alert and oriented times 3; grossly nonfocal.  Psych: Insight is good and  judgment is appropriate. Mood and affect normal.   Data Review:    Labs: Basic Metabolic Panel: Recent Labs  Lab 06/11/22 1401  NA 140  K 3.9  CL 109  CO2 22  GLUCOSE 127*  BUN 19  CREATININE 0.98  CALCIUM 9.5   Liver Function Tests: Recent Labs  Lab 06/11/22 1401  AST 29  ALT 15  ALKPHOS 47  BILITOT 0.5  PROT 7.1  ALBUMIN 3.8   Recent Labs  Lab 06/11/22 1401  LIPASE 41   No results for input(s): "AMMONIA" in the last 168 hours. CBC: Recent Labs  Lab 06/11/22 1401  WBC 10.9*  NEUTROABS 8.0*  HGB 10.4*  HCT 30.9*  MCV 91.2  PLT 253   Cardiac Enzymes: No results for input(s): "CKTOTAL", "CKMB", "CKMBINDEX", "TROPONINI" in the last 168 hours.  BNP (last 3 results) No results for input(s): "PROBNP" in the last 8760 hours. CBG: No results for input(s): "GLUCAP" in the last 168 hours.  Urinalysis    Component Value Date/Time   COLORURINE YELLOW (A) 06/11/2022 1401   APPEARANCEUR HAZY (A) 06/11/2022 1401   APPEARANCEUR Clear 06/13/2019 0950   LABSPEC 1.017 06/11/2022 1401   PHURINE 6.0 06/11/2022 1401   GLUCOSEU NEGATIVE 06/11/2022 1401   HGBUR NEGATIVE 06/11/2022 1401   BILIRUBINUR NEGATIVE 06/11/2022 1401   BILIRUBINUR Negative 06/13/2019 0950   KETONESUR NEGATIVE 06/11/2022 1401   PROTEINUR NEGATIVE 06/11/2022 1401   NITRITE NEGATIVE 06/11/2022 1401   LEUKOCYTESUR TRACE (A) 06/11/2022 1401      Radiographic Studies: No results found.  EKG: No EKG available   Assessment/Plan:   Principal Problem:   Melena Active Problems:   CAD (coronary artery disease)   Iron deficiency anemia    Body mass index is 24.83 kg/m.   Melena with history of GI bleed: Admit to MedSurg for observation.  Treat with IV Protonix.  Hold Plavix.  Keep  NPO.  D5 Ringer's lactate infusion for hydration.  Consulted Dr. Vicente Males, gastroenterologist, to assist with management.  Acute on chronic iron deficiency anemia: Hemoglobin was 14.9 on 05/19/2022.   Hemoglobin is 10.4 today.  Monitor H&H and transfuse as needed.  CAD with  RCA stent in 2008, PVD with left lower extremity stent: Hold Plavix because of melena.  History of hypertension: Hold enalapril  Other comorbidities include hyperlipidemia, OSA, GERD with Barrett's esophagus, arthritis,   Other information:   DVT prophylaxis: SCD  Code Status: Full code. Family Communication: None  Disposition Plan: Plan to discharge home in 2 to 3 days Consults called: Gastroenterologist Admission status: Patient    The medical decision making is of moderate complexity, therefore this is a level 2 visit.   Marietta Sikkema [facility] Pager: Please check [email or web address]   How to contact the [facility] Attending or Consulting provider [place] or covering provider during after hours [place], for this patient?   Check the care team in [facility] and look for a) attending/consulting [facility] provider listed and b) the [facility] team listed Log into [email or web address] and use [place]'s universal password to access. If you do not have the password, please contact the hospital operator. Locate the [facility] provider you are looking for under [facility] and page to a number that you can be directly reached. If you still have difficulty reaching the provider, please page the [facility] (Director on Call) for the Hospitalists listed on amion for assistance.  06/11/2022, 4:58 PM

## 2022-06-12 NOTE — Progress Notes (Signed)
Pt A&Ox4, stating he wants to leave hospital. Pt refusing Protonix gtt, IV fluids, and telemetry at this time. Pt informed that if he wished to leave, it would be against medical advise. Pt understands risks of leaving AMA. AMA formed signed by patient. IV removed by this RN. Hospitalist notified of patient leaving AMA. Patient ambulatory off unit with steady gait.

## 2022-06-14 ENCOUNTER — Encounter (INDEPENDENT_AMBULATORY_CARE_PROVIDER_SITE_OTHER): Payer: Self-pay

## 2022-06-16 ENCOUNTER — Ambulatory Visit: Payer: PPO | Admitting: Urology

## 2022-06-19 NOTE — Discharge Summary (Signed)
Chart review showed that patient had left against medical advice in the early hours of the morning. It appears nocturnist had been notified of patient's intention to leave AMA.   This is a nonbillable encounter because I did not see the patient on 06/12/2022.

## 2022-06-23 ENCOUNTER — Ambulatory Visit: Payer: PPO | Admitting: Urology

## 2022-06-23 ENCOUNTER — Encounter: Payer: Self-pay | Admitting: Urology

## 2022-06-27 ENCOUNTER — Other Ambulatory Visit: Payer: Self-pay | Admitting: Urology

## 2022-07-14 ENCOUNTER — Other Ambulatory Visit: Payer: Self-pay | Admitting: Urology

## 2022-07-17 ENCOUNTER — Other Ambulatory Visit: Payer: Self-pay | Admitting: Urology

## 2022-08-10 ENCOUNTER — Other Ambulatory Visit: Payer: Self-pay | Admitting: Urology

## 2022-09-02 ENCOUNTER — Ambulatory Visit: Admit: 2022-09-02 | Payer: PPO | Admitting: Gastroenterology

## 2022-09-02 SURGERY — COLONOSCOPY
Anesthesia: General

## 2022-09-03 DIAGNOSIS — L821 Other seborrheic keratosis: Secondary | ICD-10-CM | POA: Diagnosis not present

## 2022-09-03 DIAGNOSIS — D2271 Melanocytic nevi of right lower limb, including hip: Secondary | ICD-10-CM | POA: Diagnosis not present

## 2022-09-03 DIAGNOSIS — D225 Melanocytic nevi of trunk: Secondary | ICD-10-CM | POA: Diagnosis not present

## 2022-09-03 DIAGNOSIS — D2272 Melanocytic nevi of left lower limb, including hip: Secondary | ICD-10-CM | POA: Diagnosis not present

## 2022-09-03 DIAGNOSIS — D2262 Melanocytic nevi of left upper limb, including shoulder: Secondary | ICD-10-CM | POA: Diagnosis not present

## 2022-09-03 DIAGNOSIS — D2261 Melanocytic nevi of right upper limb, including shoulder: Secondary | ICD-10-CM | POA: Diagnosis not present

## 2022-09-20 ENCOUNTER — Inpatient Hospital Stay: Payer: PPO | Attending: Oncology

## 2022-09-20 DIAGNOSIS — I10 Essential (primary) hypertension: Secondary | ICD-10-CM | POA: Insufficient documentation

## 2022-09-20 DIAGNOSIS — D509 Iron deficiency anemia, unspecified: Secondary | ICD-10-CM | POA: Insufficient documentation

## 2022-09-21 ENCOUNTER — Encounter: Payer: Self-pay | Admitting: Nurse Practitioner

## 2022-09-21 ENCOUNTER — Inpatient Hospital Stay: Payer: PPO

## 2022-09-21 ENCOUNTER — Inpatient Hospital Stay (HOSPITAL_BASED_OUTPATIENT_CLINIC_OR_DEPARTMENT_OTHER): Payer: PPO | Admitting: Nurse Practitioner

## 2022-09-21 VITALS — BP 163/65 | HR 56 | Resp 18

## 2022-09-21 VITALS — BP 169/85 | HR 73 | Temp 97.7°F | Wt 174.2 lb

## 2022-09-21 DIAGNOSIS — K921 Melena: Secondary | ICD-10-CM

## 2022-09-21 DIAGNOSIS — I10 Essential (primary) hypertension: Secondary | ICD-10-CM | POA: Diagnosis not present

## 2022-09-21 DIAGNOSIS — D509 Iron deficiency anemia, unspecified: Secondary | ICD-10-CM | POA: Diagnosis not present

## 2022-09-21 LAB — CBC WITH DIFFERENTIAL/PLATELET
Abs Immature Granulocytes: 0.03 10*3/uL (ref 0.00–0.07)
Basophils Absolute: 0.1 10*3/uL (ref 0.0–0.1)
Basophils Relative: 1 %
Eosinophils Absolute: 0.2 10*3/uL (ref 0.0–0.5)
Eosinophils Relative: 3 %
HCT: 39.6 % (ref 39.0–52.0)
Hemoglobin: 12.9 g/dL — ABNORMAL LOW (ref 13.0–17.0)
Immature Granulocytes: 0 %
Lymphocytes Relative: 20 %
Lymphs Abs: 1.6 10*3/uL (ref 0.7–4.0)
MCH: 27.7 pg (ref 26.0–34.0)
MCHC: 32.6 g/dL (ref 30.0–36.0)
MCV: 85.2 fL (ref 80.0–100.0)
Monocytes Absolute: 0.7 10*3/uL (ref 0.1–1.0)
Monocytes Relative: 9 %
Neutro Abs: 5.4 10*3/uL (ref 1.7–7.7)
Neutrophils Relative %: 67 %
Platelets: 246 10*3/uL (ref 150–400)
RBC: 4.65 MIL/uL (ref 4.22–5.81)
RDW: 14.3 % (ref 11.5–15.5)
WBC: 8 10*3/uL (ref 4.0–10.5)
nRBC: 0 % (ref 0.0–0.2)

## 2022-09-21 LAB — IRON AND TIBC
Iron: 62 ug/dL (ref 45–182)
Saturation Ratios: 14 % — ABNORMAL LOW (ref 17.9–39.5)
TIBC: 430 ug/dL (ref 250–450)
UIBC: 368 ug/dL

## 2022-09-21 LAB — FERRITIN: Ferritin: 14 ng/mL — ABNORMAL LOW (ref 24–336)

## 2022-09-21 MED ORDER — SODIUM CHLORIDE 0.9 % IV SOLN
INTRAVENOUS | Status: DC
  Filled 2022-09-21: qty 250

## 2022-09-21 MED ORDER — SODIUM CHLORIDE 0.9 % IV SOLN
510.0000 mg | Freq: Once | INTRAVENOUS | Status: AC
  Administered 2022-09-21: 510 mg via INTRAVENOUS
  Filled 2022-09-21: qty 510

## 2022-09-21 NOTE — Patient Instructions (Signed)
Please contact your PCP for annual wellness exam.   Please contact Dr Keane Police office to get EGD and colonoscopy scheduled.

## 2022-09-21 NOTE — Progress Notes (Signed)
[place]  [phone] [phone]  ID: Ivan Reyes OB: October 31, 1945  MR#: [id number]  CSN#:[id number]  Patient Care Team: Baxter Hire, MD as PCP - General (Internal Medicine) Lloyd Huger, MD as Consulting Physician (Hematology and Oncology)  CHIEF COMPLAINT: Iron deficiency anemia.  INTERVAL HISTORY: Patient returns to clinic today for repeat laboratory work, further evaluation, and consideration of additional IV Feraheme.  Patient states he gets significantly fatigue mid-to-late afternoon, but otherwise feels well.  He currently feels well and is asymptomatic.  He has no neurologic complaints.  He denies any recent fevers or illnesses.  He has a good appetite and denies weight loss.  He denies any chest pain, shortness of breath, cough, or hemoptysis.  He denies any nausea, vomiting, constipation, or diarrhea.  He has no melena or hematochezia.  He has no urinary complaints.  Patient offers no further specific complaints today.    REVIEW OF SYSTEMS:   Review of Systems  Constitutional: Negative.  Negative for fever, malaise/fatigue and weight loss.  Respiratory: Negative.  Negative for cough and shortness of breath.   Cardiovascular: Negative.  Negative for chest pain and leg swelling.  Gastrointestinal: Negative.  Negative for abdominal pain, blood in stool and melena.  Genitourinary: Negative.  Negative for dysuria and hematuria.  Musculoskeletal: Negative.  Negative for back pain.  Skin: Negative.  Negative for rash.  Neurological: Negative.  Negative for dizziness, focal weakness, weakness and headaches.  Psychiatric/Behavioral: Negative.  The patient is not nervous/anxious.   As per HPI. Otherwise, a complete review of systems is negative.  PAST MEDICAL HISTORY: Past Medical History:  Diagnosis Date   Arthritis    Coronary artery disease    GERD (gastroesophageal reflux disease)    Hyperlipidemia    Hypertension     Mitral valve disorder    Peripheral vascular disease (HCC)    Sleep apnea    use C-PAP    PAST SURGICAL HISTORY: Past Surgical History:  Procedure Laterality Date   BROW LIFT Bilateral 07/24/2020   Procedure: BLEPHAROPLASTY UPPER EYELID; W/EXCESS SKIN BILATERAL;  Surgeon: Karle Starch, MD;  Location: [facility];  Service: Ophthalmology;  Laterality: Bilateral;  sleep apnea   CAROTID ENDARTERECTOMY Bilateral    Dr. Delana Meyer, [place] Left 12/23/2016   Procedure: CARPAL TUNNEL RELEASE ENDOSCOPIC;  Surgeon: Corky Mull, MD;  Location: [facility];  Service: Orthopedics;  Laterality: Left;   CARPAL TUNNEL RELEASE Right 01/13/2017   Procedure: CARPAL TUNNEL RELEASE ENDOSCOPIC;  Surgeon: Corky Mull, MD;  Location: [facility];  Service: Orthopedics;  Laterality: Right;   COLONOSCOPY WITH PROPOFOL N/A 09/04/2018   Procedure: COLONOSCOPY WITH PROPOFOL;  Surgeon: Manya Silvas, MD;  Location: [facility] ENDOSCOPY;  Service: Endoscopy;  Laterality: N/A;   CORONARY ANGIOPLASTY     3 stents   coronary atherosclerosis of  autologous graft     ESOPHAGOGASTRODUODENOSCOPY (EGD) WITH PROPOFOL N/A 12/12/2017   Procedure: ESOPHAGOGASTRODUODENOSCOPY (EGD) WITH PROPOFOL;  Surgeon: Manya Silvas, MD;  Location: [facility] ENDOSCOPY;  Service: Endoscopy;  Laterality: N/A;   ESOPHAGOGASTRODUODENOSCOPY (EGD) WITH PROPOFOL N/A 09/04/2018   Procedure: ESOPHAGOGASTRODUODENOSCOPY (EGD) WITH PROPOFOL;  Surgeon: Manya Silvas, MD;  Location: [facility] ENDOSCOPY;  Service: Endoscopy;  Laterality: N/A;   EYE SURGERY Bilateral    Cataract Extraction with IOL   FRACTURE SURGERY Right    Hip Pinning, Dr. Franchot Mimes, Jr   JOINT REPLACEMENT     left knee  KNEE ARTHROPLASTY Left 06/30/2016   Procedure: COMPUTER ASSISTED TOTAL KNEE ARTHROPLASTY;  Surgeon: Dereck Leep, MD;  Location: [facility];  Service: Orthopedics;  Laterality: Left;   LOWER EXTREMITY ANGIOGRAPHY Left 05/06/2020   Procedure: LOWER  EXTREMITY ANGIOGRAPHY;  Surgeon: Katha Cabal, MD;  Location: [facility] CV LAB;  Service: Cardiovascular;  Laterality: Left;   MASS EXCISION Left 01/11/2018   Procedure: EXCISION CYSTIC MASS ANTERIOR KNEE;  Surgeon: Dereck Leep, MD;  Location: [facility];  Service: Orthopedics;  Laterality: Left;    FAMILY HISTORY: History reviewed. No pertinent family history.  ADVANCED DIRECTIVES (Y/N):  N  HEALTH MAINTENANCE: Social History   Tobacco Use   Smoking status: Every Day    Packs/day: 0.50    Years: 60.00    Total pack years: 30.00    Types: Cigarettes   Smokeless tobacco: Never   Tobacco comments:    since age 77  Vaping Use   Vaping Use: Never used  Substance Use Topics   Alcohol use: Yes    Alcohol/week: 4.0 standard drinks of alcohol    Types: 2 Cans of beer, 2 Standard drinks or equivalent per week    Comment: 4/ week   Drug use: No     Colonoscopy:  PAP:  Bone density:  Lipid panel:  No Known Allergies  Current Outpatient Medications  Medication Sig Dispense Refill   Cholecalciferol (VITAMIN D) 2000 units CAPS Take 2,000 Units by mouth every evening.     clopidogrel (PLAVIX) 75 MG tablet Take 75 mg by mouth daily.     enalapril (VASOTEC) 20 MG tablet Take 20 mg by mouth 2 (two) times daily.     fenofibrate micronized (LOFIBRA) 134 MG capsule Take 134 mg by mouth daily before breakfast.      finasteride (PROSCAR) 5 MG tablet Take 5 mg by mouth daily.     Glucosamine HCl (GLUCOSAMINE PO) Take 1 tablet by mouth 2 (two) times daily.     Multiple Vitamins-Minerals (PX COMPLETE SENIOR MULTIVITS) TABS Take 1 tablet by mouth.     Omega-3 Fatty Acids (OMEGA-3 FISH OIL) 300 MG CAPS Take 300 mg by mouth 2 (two) times daily.     pantoprazole (PROTONIX) 20 MG tablet Take 20 mg by mouth every morning.     sildenafil (REVATIO) 20 MG tablet Take 20-40 mg by mouth daily as needed. Pt states 2 morning and night     simvastatin (ZOCOR) 40 MG tablet Take 40 mg by mouth  daily at 6 PM.      tamsulosin (FLOMAX) 0.4 MG CAPS capsule Take 1 capsule (0.4 mg total) by mouth daily. 30 capsule 11   vitamin B-12 (CYANOCOBALAMIN) 1000 MCG tablet Take 1,000 mcg by mouth daily.     vitamin E 400 UNIT capsule Take 400 Units by mouth every evening.      No current facility-administered medications for this visit.    OBJECTIVE: Vitals:   09/21/22 1308  BP: (!) 169/85  Pulse: 73  Temp: 97.7 F (36.5 C)     Body mass index is 25 kg/m.    ECOG FS:0 - Asymptomatic  General: Well-developed, well-nourished, no acute distress. Eyes: Pink conjunctiva, anicteric sclera. HEENT: Normocephalic, moist mucous membranes. Lungs: No audible wheezing or coughing. Heart: Regular rate and rhythm. Abdomen: Soft, nontender, no obvious distention. Musculoskeletal: No edema, cyanosis, or clubbing. Neuro: Alert, answering all questions appropriately. Cranial nerves grossly intact. Skin: No rashes or petechiae noted. Psych: Normal affect.   LAB RESULTS:  Lab Results  Component Value Date   NA 140 06/11/2022   K 3.9 06/11/2022   CL 109 06/11/2022   CO2 22 06/11/2022   GLUCOSE 127 (H) 06/11/2022   BUN 19 06/11/2022   CREATININE 0.98 06/11/2022   CALCIUM 9.5 06/11/2022   PROT 7.1 06/11/2022   ALBUMIN 3.8 06/11/2022   AST 29 06/11/2022   ALT 15 06/11/2022   ALKPHOS 47 06/11/2022   BILITOT 0.5 06/11/2022   GFRNONAA >60 06/11/2022   GFRAA >60 05/06/2020    Lab Results  Component Value Date   WBC 10.9 (H) 06/11/2022   NEUTROABS 8.0 (H) 06/11/2022   HGB 10.4 (L) 06/11/2022   HCT 30.9 (L) 06/11/2022   MCV 91.2 06/11/2022   PLT 253 06/11/2022   Lab Results  Component Value Date   IRON 78 05/19/2022   TIBC 421 05/19/2022   IRONPCTSAT 19 05/19/2022   Lab Results  Component Value Date   FERRITIN 17 (L) 05/19/2022    STUDIES: No results found.  ASSESSMENT: Iron deficiency anemia.  PLAN:   1.  Etiology of Iron deficiency anemia: Unclear. He had colonoscopy and  upper endoscopy in 09/04/18 which did not reveal any significant pathology. CT Hematuria workup was also negative. Etiology thought to be secondary to GI bleeding but never confirmed. He represented to ER on 10/23 with Melena but left AMA. He has cancelled follow up endoscopy and colonoscopy with Dr. Virgina Jock for January 2024. Denies recurrent bleeding/black stools. Recommended that he reach our to Dr. Edwina Barth, PCP for annual wellness exam and cancer screenings. Also recommended that he reach out to Dr. Keane Police office for evaluation. If colonoscopy and endoscopy are negative, consider capsule.   2. Anemia- due to iron deficiency. Hemoglobin has improved. Clinically asymptomatic. Ferritin was low at 17 in October. Suspect he has had ongoing blood loss since that time. Tolerates feraheme well without significant side effects. Recommend Feraheme 510 mg today and second dose next week. Iron studies pending at time of visit.   3. Hypertension- recommend evaluation with pcp and monitoring at home.   4. Lung cancer screening- recommend evaluation with pcp and possible screening.   Disposition:  Feraheme today Next week- feraheme 3 mo- labs (cbc, cmp, ferritin, iron studies) Day to week later- see Dr Grayland Ormond or myself, +/- feraheme- la  I spent a total of 30 minutes reviewing chart data, face-to-face evaluation with the patient, counseling and coordination of care as detailed above.  Patient expressed understanding and was in agreement with this plan. He also understands that He can call clinic at any time with any questions, concerns, or complaints.   Verlon Au, NP  09/21/2022

## 2022-09-21 NOTE — Progress Notes (Signed)
Pt has been educated and understands. Pt refused to stay 30 mins after iron infusion. VSS. 

## 2022-09-27 MED FILL — Ferumoxytol Inj 510 MG/17ML (30 MG/ML) (Elemental Fe): INTRAVENOUS | Qty: 17 | Status: AC

## 2022-09-28 ENCOUNTER — Telehealth: Payer: Self-pay | Admitting: Urology

## 2022-09-28 ENCOUNTER — Inpatient Hospital Stay: Payer: PPO

## 2022-09-28 VITALS — BP 132/60 | HR 77 | Temp 96.0°F | Resp 18

## 2022-09-28 DIAGNOSIS — D509 Iron deficiency anemia, unspecified: Secondary | ICD-10-CM | POA: Diagnosis not present

## 2022-09-28 DIAGNOSIS — N138 Other obstructive and reflux uropathy: Secondary | ICD-10-CM

## 2022-09-28 MED ORDER — SODIUM CHLORIDE 0.9 % IV SOLN
INTRAVENOUS | Status: DC
  Filled 2022-09-28: qty 250

## 2022-09-28 MED ORDER — TAMSULOSIN HCL 0.4 MG PO CAPS: 0.4000 mg | ORAL_CAPSULE | Freq: Every day | ORAL | 0 refills | Status: DC

## 2022-09-28 MED ORDER — SODIUM CHLORIDE 0.9 % IV SOLN
510.0000 mg | Freq: Once | INTRAVENOUS | Status: AC
  Administered 2022-09-28: 510 mg via INTRAVENOUS
  Filled 2022-09-28: qty 510

## 2022-09-28 NOTE — Telephone Encounter (Signed)
RX sent  

## 2022-09-28 NOTE — Patient Instructions (Signed)

## 2022-09-28 NOTE — Telephone Encounter (Signed)
Pt has appt in March and wants to know if he can have 1 month of Tamsulosin sent to [facility] on [address] to last until his appt

## 2022-10-07 DIAGNOSIS — I10 Essential (primary) hypertension: Secondary | ICD-10-CM | POA: Diagnosis not present

## 2022-10-14 DIAGNOSIS — Z125 Encounter for screening for malignant neoplasm of prostate: Secondary | ICD-10-CM | POA: Diagnosis not present

## 2022-10-14 DIAGNOSIS — G4733 Obstructive sleep apnea (adult) (pediatric): Secondary | ICD-10-CM | POA: Diagnosis not present

## 2022-10-14 DIAGNOSIS — F17210 Nicotine dependence, cigarettes, uncomplicated: Secondary | ICD-10-CM | POA: Diagnosis not present

## 2022-10-14 DIAGNOSIS — E782 Mixed hyperlipidemia: Secondary | ICD-10-CM | POA: Diagnosis not present

## 2022-10-14 DIAGNOSIS — Z720 Tobacco use: Secondary | ICD-10-CM | POA: Diagnosis not present

## 2022-10-14 DIAGNOSIS — I70219 Atherosclerosis of native arteries of extremities with intermittent claudication, unspecified extremity: Secondary | ICD-10-CM | POA: Diagnosis not present

## 2022-10-14 DIAGNOSIS — I10 Essential (primary) hypertension: Secondary | ICD-10-CM | POA: Diagnosis not present

## 2022-10-14 DIAGNOSIS — Z0000 Encounter for general adult medical examination without abnormal findings: Secondary | ICD-10-CM | POA: Diagnosis not present

## 2022-10-14 DIAGNOSIS — I2510 Atherosclerotic heart disease of native coronary artery without angina pectoris: Secondary | ICD-10-CM | POA: Diagnosis not present

## 2022-10-27 ENCOUNTER — Ambulatory Visit: Payer: PPO | Admitting: Urology

## 2022-10-27 ENCOUNTER — Encounter: Payer: Self-pay | Admitting: Urology

## 2022-10-27 VITALS — BP 113/68 | HR 62 | Ht 70.0 in | Wt 173.6 lb

## 2022-10-27 DIAGNOSIS — R3912 Poor urinary stream: Secondary | ICD-10-CM | POA: Diagnosis not present

## 2022-10-27 DIAGNOSIS — N401 Enlarged prostate with lower urinary tract symptoms: Secondary | ICD-10-CM | POA: Diagnosis not present

## 2022-10-27 DIAGNOSIS — Z125 Encounter for screening for malignant neoplasm of prostate: Secondary | ICD-10-CM | POA: Diagnosis not present

## 2022-10-27 DIAGNOSIS — R399 Unspecified symptoms and signs involving the genitourinary system: Secondary | ICD-10-CM

## 2022-10-27 DIAGNOSIS — N138 Other obstructive and reflux uropathy: Secondary | ICD-10-CM

## 2022-10-27 LAB — BLADDER SCAN AMB NON-IMAGING

## 2022-10-27 MED ORDER — TAMSULOSIN HCL 0.4 MG PO CAPS: 0.4000 mg | ORAL_CAPSULE | Freq: Every day | ORAL | 3 refills | Status: DC

## 2022-10-27 NOTE — Progress Notes (Signed)
   10/27/2022 12:24 PM   Ivan Reyes [birth year] [id number]  Reason for visit: Follow up BPH, UTI, gross hematuria, PSA screening, ED  HPI: Very comorbid 77 year old male with extensive CAD/PVD on Plavix who I originally saw in the fall 2020 with gross hematuria and passing pieces of pale tissue.  He did have a UTI on culture and his symptoms improved.  With his extensive smoking history and passage of tissue he underwent a work-up with a CT urogram and cystoscopy.  CT was benign and showed a 35 g prostate, and cystoscopy showed moderate bladder trabeculations and a small diverticula, but no tumors or lesions.    He has been on Flomax for weak urinary stream.  PSA in August 2022 was normal at 1.07.  With his age and comorbidities, he is not a good candidate for ongoing PSA screening, and I recommended discontinuing PSA screening.  PCP is continued to check PSA, most recently was normal at 0.55.  Again with his age and comorbidities, guidelines would not recommend continuing PSA screening.  Recent urinalysis 06/11/2022 was benign, renal function normal.  He denies any major issues over the last year.  If he stops the Flomax he does notice significant weakening of the urinary stream and increased frequency.  PVR today is normal at 71ml.  He denies any UTIs or gross hematuria.  Using Cialis from PCP for ED with good results.  -Behavioral strategies discussed at length regarding urinary symptoms including minimizing soda, beer, and smoking -Continue Flomax, refilled -Recommend discontinuing PSA screening per AUA guideline recommendations -RTC 1 year PVR    Billey Co, MD  [facility] [address] [place], [place] [address] [phone]

## 2022-11-23 ENCOUNTER — Ambulatory Visit (INDEPENDENT_AMBULATORY_CARE_PROVIDER_SITE_OTHER): Payer: PPO | Admitting: Nurse Practitioner

## 2022-11-23 ENCOUNTER — Encounter (INDEPENDENT_AMBULATORY_CARE_PROVIDER_SITE_OTHER): Payer: Self-pay | Admitting: Nurse Practitioner

## 2022-11-23 ENCOUNTER — Ambulatory Visit (INDEPENDENT_AMBULATORY_CARE_PROVIDER_SITE_OTHER): Payer: PPO

## 2022-11-23 VITALS — BP 121/66 | HR 58 | Resp 16 | Wt 170.6 lb

## 2022-11-23 DIAGNOSIS — I70213 Atherosclerosis of native arteries of extremities with intermittent claudication, bilateral legs: Secondary | ICD-10-CM

## 2022-11-23 DIAGNOSIS — I6523 Occlusion and stenosis of bilateral carotid arteries: Secondary | ICD-10-CM

## 2022-11-23 DIAGNOSIS — I10 Essential (primary) hypertension: Secondary | ICD-10-CM

## 2022-11-23 NOTE — Progress Notes (Signed)
Subjective:    Patient ID: Ivan Reyes, male    DOB: May 17, 1946, 77 y.o.   MRN: [id number] Chief Complaint  Patient presents with   Follow-up    Ultrasound follow up    The patient returns to the office for followup and review of the noninvasive studies. There have been no interval changes in lower extremity symptoms. No interval shortening of the patient's claudication distance or development of rest pain symptoms.  Patient notes that he is able to bike about 4.5 miles on a regular basis.  No new ulcers or wounds have occurred since the last visit.  He notes that at his last visit he was having claudication symptoms but this has markedly improved.  The patient also has a history of bilateral carotid endarterectomies.   There have been no significant changes to the patient's overall health care.   The patient denies amaurosis fugax or recent TIA symptoms. There are no recent neurological changes noted. The patient denies history of DVT, PE or superficial thrombophlebitis. The patient denies recent episodes of angina or shortness of breath.    ABI Rt=1.21 and Lt=0.0.76  (previous ABI's Rt=1.04 and Lt=0.95) Duplex ultrasound of the right lower extremity notes triphasic waveforms with normal toe waveforms.  The patient has multiphasic waveforms on the left.  He has good toe waveforms bilaterally.  Previous studies noted 50 to 74% stenosis at the distal left SFA which is likely an area of stenosis and an older stent.     Review of Systems  Musculoskeletal:  Negative for gait problem.  Skin:  Negative for wound.  All other systems reviewed and are negative.      Objective:   Physical Exam Vitals reviewed.  HENT:     Head: Normocephalic.  Neck:     Vascular: No carotid bruit.  Cardiovascular:     Rate and Rhythm: Normal rate.     Pulses:          Radial pulses are 2+ on the right side.       Dorsalis pedis pulses are detected w/ Doppler on the right side and detected w/  Doppler on the left side.       Posterior tibial pulses are detected w/ Doppler on the right side and detected w/ Doppler on the left side.  Pulmonary:     Effort: Pulmonary effort is normal.  Skin:    General: Skin is warm and dry.  Neurological:     Mental Status: He is alert and oriented to person, place, and time.  Psychiatric:        Mood and Affect: Mood normal.        Behavior: Behavior normal.        Thought Content: Thought content normal.        Judgment: Judgment normal.     BP 121/66 (BP Location: Right Arm)   Pulse (!) 58   Resp 16   Wt 170 lb 9.6 oz (77.4 kg)   BMI 24.48 kg/m   Past Medical History:  Diagnosis Date   Arthritis    Coronary artery disease    GERD (gastroesophageal reflux disease)    Hyperlipidemia    Hypertension    Mitral valve disorder    Peripheral vascular disease    Sleep apnea    use C-PAP    Social History   Socioeconomic History   Marital status: Widowed    Spouse name: Not on file   Number of children: Not on file  Years of education: Not on file   Highest education level: Not on file  Occupational History   Not on file  Tobacco Use   Smoking status: Every Day    Packs/day: 0.50    Years: 60.00    Additional pack years: 0.00    Total pack years: 30.00    Types: Cigarettes   Smokeless tobacco: Never   Tobacco comments:    since age 80  Vaping Use   Vaping Use: Never used  Substance and Sexual Activity   Alcohol use: Yes    Alcohol/week: 4.0 standard drinks of alcohol    Types: 2 Cans of beer, 2 Standard drinks or equivalent per week    Comment: 4/ week   Drug use: No   Sexual activity: Not on file  Other Topics Concern   Not on file  Social History Narrative   Not on file   Social Determinants of Health   Financial Resource Strain: Not on file  Food Insecurity: No Food Insecurity (06/11/2022)   Hunger Vital Sign    Worried About Running Out of Food in the Last Year: Never true    Ran Out of Food in the  Last Year: Never true  Transportation Needs: No Transportation Needs (06/11/2022)   PRAPARE - Administrator, Civil Service (Medical): No    Lack of Transportation (Non-Medical): No  Physical Activity: Not on file  Stress: Not on file  Social Connections: Not on file  Intimate Partner Violence: Not At Risk (06/11/2022)   Humiliation, Afraid, Rape, and Kick questionnaire    Fear of Current or Ex-Partner: No    Emotionally Abused: No    Physically Abused: No    Sexually Abused: No    Past Surgical History:  Procedure Laterality Date   BROW LIFT Bilateral 07/24/2020   Procedure: BLEPHAROPLASTY UPPER EYELID; W/EXCESS SKIN BILATERAL;  Surgeon: Imagene Riches, MD;  Location: [facility];  Service: Ophthalmology;  Laterality: Bilateral;  sleep apnea   CAROTID ENDARTERECTOMY Bilateral    Dr. Gilda Crease, [facility]   CARPAL TUNNEL RELEASE Left 12/23/2016   Procedure: CARPAL TUNNEL RELEASE ENDOSCOPIC;  Surgeon: Christena Flake, MD;  Location: [facility];  Service: Orthopedics;  Laterality: Left;   CARPAL TUNNEL RELEASE Right 01/13/2017   Procedure: CARPAL TUNNEL RELEASE ENDOSCOPIC;  Surgeon: Christena Flake, MD;  Location: [facility];  Service: Orthopedics;  Laterality: Right;   COLONOSCOPY WITH PROPOFOL N/A 09/04/2018   Procedure: COLONOSCOPY WITH PROPOFOL;  Surgeon: Scot Jun, MD;  Location: [facility] ENDOSCOPY;  Service: Endoscopy;  Laterality: N/A;   CORONARY ANGIOPLASTY     3 stents   coronary atherosclerosis of  autologous graft     ESOPHAGOGASTRODUODENOSCOPY (EGD) WITH PROPOFOL N/A 12/12/2017   Procedure: ESOPHAGOGASTRODUODENOSCOPY (EGD) WITH PROPOFOL;  Surgeon: Scot Jun, MD;  Location: [facility] ENDOSCOPY;  Service: Endoscopy;  Laterality: N/A;   ESOPHAGOGASTRODUODENOSCOPY (EGD) WITH PROPOFOL N/A 09/04/2018   Procedure: ESOPHAGOGASTRODUODENOSCOPY (EGD) WITH PROPOFOL;  Surgeon: Scot Jun, MD;  Location: [facility] ENDOSCOPY;  Service: Endoscopy;  Laterality: N/A;   EYE SURGERY  Bilateral    Cataract Extraction with IOL   FRACTURE SURGERY Right    Hip Pinning, Dr. Neva Seat, Jr   JOINT REPLACEMENT     left knee   KNEE ARTHROPLASTY Left 06/30/2016   Procedure: COMPUTER ASSISTED TOTAL KNEE ARTHROPLASTY;  Surgeon: Donato Heinz, MD;  Location: [facility];  Service: Orthopedics;  Laterality: Left;   LOWER EXTREMITY ANGIOGRAPHY Left 05/06/2020  Procedure: LOWER EXTREMITY ANGIOGRAPHY;  Surgeon: Renford Dills, MD;  Location: [facility] INVASIVE CV LAB;  Service: Cardiovascular;  Laterality: Left;   MASS EXCISION Left 01/11/2018   Procedure: EXCISION CYSTIC MASS ANTERIOR KNEE;  Surgeon: Donato Heinz, MD;  Location: [facility];  Service: Orthopedics;  Laterality: Left;    History reviewed. No pertinent family history.  No Known Allergies     Latest Ref Rng & Units 09/21/2022    1:41 PM 06/11/2022    2:01 PM 05/19/2022    1:36 PM  CBC  WBC 4.0 - 10.5 K/uL 8.0  10.9  7.4   Hemoglobin 13.0 - 17.0 g/dL 96.0  45.4  09.8   Hematocrit 39.0 - 52.0 % 39.6  30.9  43.4   Platelets 150 - 400 K/uL 246  253  221       CMP     Component Value Date/Time   NA 140 06/11/2022 1401   K 3.9 06/11/2022 1401   K 4.1 06/28/2014 1603   CL 109 06/11/2022 1401   CO2 22 06/11/2022 1401   GLUCOSE 127 (H) 06/11/2022 1401   BUN 19 06/11/2022 1401   CREATININE 0.98 06/11/2022 1401   CALCIUM 9.5 06/11/2022 1401   PROT 7.1 06/11/2022 1401   ALBUMIN 3.8 06/11/2022 1401   AST 29 06/11/2022 1401   ALT 15 06/11/2022 1401   ALKPHOS 47 06/11/2022 1401   BILITOT 0.5 06/11/2022 1401   GFRNONAA >60 06/11/2022 1401   GFRAA >60 05/06/2020 1137     No results found.     Assessment & Plan:   1. Atherosclerosis of native artery of both lower extremities with intermittent claudication Currently the patient does have noted worsening of his studies from 6 months ago.  However he has continued to be active and his claudication symptoms have actually decreased in his left lower extremity.   Based on this we will not plan on any intervention at this time but we will advised the patient to follow-up in 6 months.  He is advised however if he begins to have sudden worsening claudication, rest pain or ulcerations to contact our office and we will have him follow-up sooner.  2. Benign essential hypertension Continue antihypertensive medications as already ordered, these medications have been reviewed and there are no changes at this time.  3. Bilateral carotid artery stenosis Recommend:  Given the patient's asymptomatic subcritical stenosis no further invasive testing or surgery at this time.  Duplex ultrasound shows 1-39% stenosis bilaterally.  Continue antiplatelet therapy as prescribed Continue management of CAD, HTN and Hyperlipidemia Healthy heart diet,  encouraged exercise at least 4 times per week Follow up in 12 months with duplex ultrasound and physical exam    Current Outpatient Medications on File Prior to Visit  Medication Sig Dispense Refill   Cholecalciferol (VITAMIN D) 2000 units CAPS Take 2,000 Units by mouth every evening.     clopidogrel (PLAVIX) 75 MG tablet Take 75 mg by mouth daily.     enalapril (VASOTEC) 20 MG tablet Take 20 mg by mouth 2 (two) times daily.     fenofibrate micronized (LOFIBRA) 134 MG capsule Take 134 mg by mouth daily before breakfast.      finasteride (PROSCAR) 5 MG tablet Take 5 mg by mouth daily.     Glucosamine HCl (GLUCOSAMINE PO) Take 1 tablet by mouth 2 (two) times daily.     Multiple Vitamins-Minerals (PX COMPLETE SENIOR MULTIVITS) TABS Take 1 tablet by mouth.     Omega-3  Fatty Acids (OMEGA-3 FISH OIL) 300 MG CAPS Take 300 mg by mouth 2 (two) times daily.     pantoprazole (PROTONIX) 20 MG tablet Take 20 mg by mouth every morning.     sildenafil (REVATIO) 20 MG tablet Take 20-40 mg by mouth daily as needed. Pt states 2 morning and night     simvastatin (ZOCOR) 40 MG tablet Take 40 mg by mouth daily at 6 PM.      tadalafil (CIALIS)  20 MG tablet Take 20 mg by mouth daily as needed.     tamsulosin (FLOMAX) 0.4 MG CAPS capsule Take 1 capsule (0.4 mg total) by mouth daily. 90 capsule 3   vitamin B-12 (CYANOCOBALAMIN) 1000 MCG tablet Take 1,000 mcg by mouth daily.     vitamin E 400 UNIT capsule Take 400 Units by mouth every evening.      No current facility-administered medications on file prior to visit.    There are no Patient Instructions on file for this visit. No follow-ups on file.   Georgiana Spinner, NP

## 2022-11-29 LAB — VAS US ABI WITH/WO TBI
Left ABI: 0.76
Right ABI: 1.21

## 2022-12-20 ENCOUNTER — Inpatient Hospital Stay: Payer: PPO | Attending: Oncology

## 2022-12-20 DIAGNOSIS — I10 Essential (primary) hypertension: Secondary | ICD-10-CM | POA: Diagnosis not present

## 2022-12-20 DIAGNOSIS — F17210 Nicotine dependence, cigarettes, uncomplicated: Secondary | ICD-10-CM | POA: Diagnosis not present

## 2022-12-20 DIAGNOSIS — D509 Iron deficiency anemia, unspecified: Secondary | ICD-10-CM

## 2022-12-20 LAB — CBC WITH DIFFERENTIAL/PLATELET
Abs Immature Granulocytes: 0.04 10*3/uL (ref 0.00–0.07)
Basophils Absolute: 0.1 10*3/uL (ref 0.0–0.1)
Basophils Relative: 1 %
Eosinophils Absolute: 0.1 10*3/uL (ref 0.0–0.5)
Eosinophils Relative: 1 %
HCT: 40 % (ref 39.0–52.0)
Hemoglobin: 13.6 g/dL (ref 13.0–17.0)
Immature Granulocytes: 1 %
Lymphocytes Relative: 16 %
Lymphs Abs: 1.3 10*3/uL (ref 0.7–4.0)
MCH: 30.7 pg (ref 26.0–34.0)
MCHC: 34 g/dL (ref 30.0–36.0)
MCV: 90.3 fL (ref 80.0–100.0)
Monocytes Absolute: 0.5 10*3/uL (ref 0.1–1.0)
Monocytes Relative: 6 %
Neutro Abs: 6.3 10*3/uL (ref 1.7–7.7)
Neutrophils Relative %: 75 %
Platelets: 216 10*3/uL (ref 150–400)
RBC: 4.43 MIL/uL (ref 4.22–5.81)
RDW: 14.8 % (ref 11.5–15.5)
WBC: 8.2 10*3/uL (ref 4.0–10.5)
nRBC: 0 % (ref 0.0–0.2)

## 2022-12-20 LAB — IRON AND TIBC
Iron: 63 ug/dL (ref 45–182)
Saturation Ratios: 21 % (ref 17.9–39.5)
TIBC: 298 ug/dL (ref 250–450)
UIBC: 235 ug/dL

## 2022-12-20 LAB — FERRITIN: Ferritin: 54 ng/mL (ref 24–336)

## 2022-12-23 ENCOUNTER — Inpatient Hospital Stay: Payer: PPO

## 2022-12-23 ENCOUNTER — Encounter: Payer: Self-pay | Admitting: Oncology

## 2022-12-23 ENCOUNTER — Inpatient Hospital Stay (HOSPITAL_BASED_OUTPATIENT_CLINIC_OR_DEPARTMENT_OTHER): Payer: PPO | Admitting: Oncology

## 2022-12-23 VITALS — BP 141/57 | HR 68 | Temp 96.7°F | Wt 169.9 lb

## 2022-12-23 DIAGNOSIS — D509 Iron deficiency anemia, unspecified: Secondary | ICD-10-CM

## 2022-12-23 NOTE — Progress Notes (Signed)
[facility]  [phone] [phone]  ID: Ivan Reyes OB: [birth year]  MR#: [id number]  AOZ#:[id number]  Patient Care Team: Gracelyn Nurse, MD as PCP - General (Internal Medicine) Jeralyn Ruths, MD as Consulting Physician (Hematology and Oncology)  CHIEF COMPLAINT: Iron deficiency anemia.  INTERVAL HISTORY: Patient returns to clinic today for repeat laboratory work, further evaluation, and consideration of additional IV Feraheme.  He currently feels well and is asymptomatic.  He does not complain of any weakness or fatigue today. He has no neurologic complaints.  He denies any recent fevers or illnesses.  He has a good appetite and denies weight loss.  He denies any chest pain, shortness of breath, cough, or hemoptysis.  He denies any nausea, vomiting, constipation, or diarrhea.  He has no melena or hematochezia.  He has no urinary complaints.  Patient offers no specific complaints today.    REVIEW OF SYSTEMS:   Review of Systems  Constitutional: Negative.  Negative for fever, malaise/fatigue and weight loss.  Respiratory: Negative.  Negative for cough and shortness of breath.   Cardiovascular: Negative.  Negative for chest pain and leg swelling.  Gastrointestinal: Negative.  Negative for abdominal pain, blood in stool and melena.  Genitourinary: Negative.  Negative for dysuria and hematuria.  Musculoskeletal: Negative.  Negative for back pain.  Skin: Negative.  Negative for rash.  Neurological: Negative.  Negative for dizziness, focal weakness, weakness and headaches.  Psychiatric/Behavioral: Negative.  The patient is not nervous/anxious.     As per HPI. Otherwise, a complete review of systems is negative.  PAST MEDICAL HISTORY: Past Medical History:  Diagnosis Date   Arthritis    Coronary artery disease    GERD (gastroesophageal reflux disease)    Hyperlipidemia    Hypertension    Mitral valve disorder    Peripheral vascular  disease (HCC)    Sleep apnea    use C-PAP    PAST SURGICAL HISTORY: Past Surgical History:  Procedure Laterality Date   BROW LIFT Bilateral 07/24/2020   Procedure: BLEPHAROPLASTY UPPER EYELID; W/EXCESS SKIN BILATERAL;  Surgeon: Imagene Riches, MD;  Location: [facility];  Service: Ophthalmology;  Laterality: Bilateral;  sleep apnea   CAROTID ENDARTERECTOMY Bilateral    Dr. Gilda Crease, [facility]   CARPAL TUNNEL RELEASE Left 12/23/2016   Procedure: CARPAL TUNNEL RELEASE ENDOSCOPIC;  Surgeon: Christena Flake, MD;  Location: [facility] ORS;  Service: Orthopedics;  Laterality: Left;   CARPAL TUNNEL RELEASE Right 01/13/2017   Procedure: CARPAL TUNNEL RELEASE ENDOSCOPIC;  Surgeon: Christena Flake, MD;  Location: [facility] ORS;  Service: Orthopedics;  Laterality: Right;   COLONOSCOPY WITH PROPOFOL N/A 09/04/2018   Procedure: COLONOSCOPY WITH PROPOFOL;  Surgeon: Scot Jun, MD;  Location: [facility] ENDOSCOPY;  Service: Endoscopy;  Laterality: N/A;   CORONARY ANGIOPLASTY     3 stents   coronary atherosclerosis of  autologous graft     ESOPHAGOGASTRODUODENOSCOPY (EGD) WITH PROPOFOL N/A 12/12/2017   Procedure: ESOPHAGOGASTRODUODENOSCOPY (EGD) WITH PROPOFOL;  Surgeon: Scot Jun, MD;  Location: [facility] ENDOSCOPY;  Service: Endoscopy;  Laterality: N/A;   ESOPHAGOGASTRODUODENOSCOPY (EGD) WITH PROPOFOL N/A 09/04/2018   Procedure: ESOPHAGOGASTRODUODENOSCOPY (EGD) WITH PROPOFOL;  Surgeon: Scot Jun, MD;  Location: [facility] ENDOSCOPY;  Service: Endoscopy;  Laterality: N/A;   EYE SURGERY Bilateral    Cataract Extraction with IOL   FRACTURE SURGERY Right    Hip Pinning, Dr. Neva Seat, Jr   JOINT REPLACEMENT     left knee   KNEE  ARTHROPLASTY Left 06/30/2016   Procedure: COMPUTER ASSISTED TOTAL KNEE ARTHROPLASTY;  Surgeon: Donato Heinz, MD;  Location: [facility] ORS;  Service: Orthopedics;  Laterality: Left;   LOWER EXTREMITY ANGIOGRAPHY Left 05/06/2020   Procedure: LOWER EXTREMITY ANGIOGRAPHY;  Surgeon: Renford Dills, MD;  Location: [facility] INVASIVE CV LAB;  Service: Cardiovascular;  Laterality: Left;   MASS EXCISION Left 01/11/2018   Procedure: EXCISION CYSTIC MASS ANTERIOR KNEE;  Surgeon: Donato Heinz, MD;  Location: [facility] ORS;  Service: Orthopedics;  Laterality: Left;    FAMILY HISTORY: History reviewed. No pertinent family history.  ADVANCED DIRECTIVES (Y/N):  N  HEALTH MAINTENANCE: Social History   Tobacco Use   Smoking status: Every Day    Packs/day: 0.50    Years: 60.00    Additional pack years: 0.00    Total pack years: 30.00    Types: Cigarettes   Smokeless tobacco: Never   Tobacco comments:    since age 42  Vaping Use   Vaping Use: Never used  Substance Use Topics   Alcohol use: Yes    Alcohol/week: 4.0 standard drinks of alcohol    Types: 2 Cans of beer, 2 Standard drinks or equivalent per week    Comment: 4/ week   Drug use: No     Colonoscopy:  PAP:  Bone density:  Lipid panel:  No Known Allergies  Current Outpatient Medications  Medication Sig Dispense Refill   Cholecalciferol (VITAMIN D) 2000 units CAPS Take 2,000 Units by mouth every evening.     clopidogrel (PLAVIX) 75 MG tablet Take 75 mg by mouth daily.     enalapril (VASOTEC) 20 MG tablet Take 20 mg by mouth 2 (two) times daily.     fenofibrate micronized (LOFIBRA) 134 MG capsule Take 134 mg by mouth daily before breakfast.      finasteride (PROSCAR) 5 MG tablet Take 5 mg by mouth daily.     Glucosamine HCl (GLUCOSAMINE PO) Take 1 tablet by mouth 2 (two) times daily.     Multiple Vitamins-Minerals (PX COMPLETE SENIOR MULTIVITS) TABS Take 1 tablet by mouth.     Omega-3 Fatty Acids (OMEGA-3 FISH OIL) 300 MG CAPS Take 300 mg by mouth 2 (two) times daily.     pantoprazole (PROTONIX) 20 MG tablet Take 20 mg by mouth every morning.     sildenafil (REVATIO) 20 MG tablet Take 20-40 mg by mouth daily as needed. Pt states 2 morning and night     simvastatin (ZOCOR) 40 MG tablet Take 40 mg by mouth daily at 6  PM.      tadalafil (CIALIS) 20 MG tablet Take 20 mg by mouth daily as needed.     tamsulosin (FLOMAX) 0.4 MG CAPS capsule Take 1 capsule (0.4 mg total) by mouth daily. 90 capsule 3   vitamin B-12 (CYANOCOBALAMIN) 1000 MCG tablet Take 1,000 mcg by mouth daily.     vitamin E 400 UNIT capsule Take 400 Units by mouth every evening.      No current facility-administered medications for this visit.    OBJECTIVE: Vitals:   12/23/22 1408  BP: (!) 141/57  Pulse: 68  Temp: (!) 96.7 F (35.9 C)  SpO2: 97%     Body mass index is 24.38 kg/m.    ECOG FS:0 - Asymptomatic  General: Well-developed, well-nourished, no acute distress. Eyes: Pink conjunctiva, anicteric sclera. HEENT: Normocephalic, moist mucous membranes. Lungs: No audible wheezing or coughing. Heart: Regular rate and rhythm. Abdomen: Soft, nontender, no obvious distention. Musculoskeletal: No  edema, cyanosis, or clubbing. Neuro: Alert, answering all questions appropriately. Cranial nerves grossly intact. Skin: No rashes or petechiae noted. Psych: Normal affect.   LAB RESULTS:  Lab Results  Component Value Date   NA 140 06/11/2022   K 3.9 06/11/2022   CL 109 06/11/2022   CO2 22 06/11/2022   GLUCOSE 127 (H) 06/11/2022   BUN 19 06/11/2022   CREATININE 0.98 06/11/2022   CALCIUM 9.5 06/11/2022   PROT 7.1 06/11/2022   ALBUMIN 3.8 06/11/2022   AST 29 06/11/2022   ALT 15 06/11/2022   ALKPHOS 47 06/11/2022   BILITOT 0.5 06/11/2022   GFRNONAA >60 06/11/2022   GFRAA >60 05/06/2020    Lab Results  Component Value Date   WBC 8.2 12/20/2022   NEUTROABS 6.3 12/20/2022   HGB 13.6 12/20/2022   HCT 40.0 12/20/2022   MCV 90.3 12/20/2022   PLT 216 12/20/2022   Lab Results  Component Value Date   IRON 63 12/20/2022   TIBC 298 12/20/2022   IRONPCTSAT 21 12/20/2022   Lab Results  Component Value Date   FERRITIN 54 12/20/2022     STUDIES: No results found.  ASSESSMENT: Iron deficiency anemia.  PLAN:   Iron  deficiency anemia: Patient's hemoglobin and iron stores are within normal limits and he is asymptomatic.  Previously, colonoscopy and upper endoscopy on September 04, 2018 did not reveal any significant pathology.  He does not require additional Feraheme today.  Patient last received treatment on September 28, 2022.  Return to clinic in 6 months with repeat laboratory work, further evaluation, and consideration of additional treatment if needed.   Hypertension: Patient's blood pressure is moderately elevated today.  Continue evaluation and treatment per primary care.  I spent a total of 20 minutes reviewing chart data, face-to-face evaluation with the patient, counseling and coordination of care as detailed above.    Patient expressed understanding and was in agreement with this plan. He also understands that He can call clinic at any time with any questions, concerns, or complaints.    Jeralyn Ruths, MD   12/24/2022 6:56 AM

## 2022-12-24 ENCOUNTER — Encounter: Payer: Self-pay | Admitting: Oncology

## 2022-12-31 DIAGNOSIS — I739 Peripheral vascular disease, unspecified: Secondary | ICD-10-CM | POA: Diagnosis not present

## 2022-12-31 DIAGNOSIS — L600 Ingrowing nail: Secondary | ICD-10-CM | POA: Diagnosis not present

## 2022-12-31 DIAGNOSIS — M778 Other enthesopathies, not elsewhere classified: Secondary | ICD-10-CM | POA: Diagnosis not present

## 2023-05-16 ENCOUNTER — Other Ambulatory Visit (INDEPENDENT_AMBULATORY_CARE_PROVIDER_SITE_OTHER): Payer: Self-pay | Admitting: Nurse Practitioner

## 2023-05-16 DIAGNOSIS — I739 Peripheral vascular disease, unspecified: Secondary | ICD-10-CM

## 2023-05-18 DIAGNOSIS — Z125 Encounter for screening for malignant neoplasm of prostate: Secondary | ICD-10-CM | POA: Diagnosis not present

## 2023-05-18 DIAGNOSIS — I10 Essential (primary) hypertension: Secondary | ICD-10-CM | POA: Diagnosis not present

## 2023-05-24 DIAGNOSIS — G4733 Obstructive sleep apnea (adult) (pediatric): Secondary | ICD-10-CM | POA: Diagnosis not present

## 2023-05-24 DIAGNOSIS — Z0001 Encounter for general adult medical examination with abnormal findings: Secondary | ICD-10-CM | POA: Diagnosis not present

## 2023-05-24 DIAGNOSIS — F17210 Nicotine dependence, cigarettes, uncomplicated: Secondary | ICD-10-CM | POA: Diagnosis not present

## 2023-05-24 DIAGNOSIS — D509 Iron deficiency anemia, unspecified: Secondary | ICD-10-CM | POA: Diagnosis not present

## 2023-05-24 DIAGNOSIS — E782 Mixed hyperlipidemia: Secondary | ICD-10-CM | POA: Diagnosis not present

## 2023-05-24 DIAGNOSIS — I10 Essential (primary) hypertension: Secondary | ICD-10-CM | POA: Diagnosis not present

## 2023-05-24 DIAGNOSIS — I2510 Atherosclerotic heart disease of native coronary artery without angina pectoris: Secondary | ICD-10-CM | POA: Diagnosis not present

## 2023-05-24 DIAGNOSIS — I70219 Atherosclerosis of native arteries of extremities with intermittent claudication, unspecified extremity: Secondary | ICD-10-CM | POA: Diagnosis not present

## 2023-05-25 ENCOUNTER — Encounter (INDEPENDENT_AMBULATORY_CARE_PROVIDER_SITE_OTHER): Payer: PPO

## 2023-05-25 ENCOUNTER — Ambulatory Visit (INDEPENDENT_AMBULATORY_CARE_PROVIDER_SITE_OTHER): Payer: PPO | Admitting: Nurse Practitioner

## 2023-06-28 ENCOUNTER — Ambulatory Visit (INDEPENDENT_AMBULATORY_CARE_PROVIDER_SITE_OTHER): Payer: PPO

## 2023-06-28 ENCOUNTER — Ambulatory Visit (INDEPENDENT_AMBULATORY_CARE_PROVIDER_SITE_OTHER): Payer: PPO | Admitting: Nurse Practitioner

## 2023-06-28 ENCOUNTER — Encounter (INDEPENDENT_AMBULATORY_CARE_PROVIDER_SITE_OTHER): Payer: Self-pay | Admitting: Nurse Practitioner

## 2023-06-28 VITALS — BP 151/77 | HR 71 | Resp 18 | Ht 73.0 in | Wt 168.2 lb

## 2023-06-28 DIAGNOSIS — I6523 Occlusion and stenosis of bilateral carotid arteries: Secondary | ICD-10-CM | POA: Diagnosis not present

## 2023-06-28 DIAGNOSIS — I739 Peripheral vascular disease, unspecified: Secondary | ICD-10-CM

## 2023-06-28 DIAGNOSIS — I70221 Atherosclerosis of native arteries of extremities with rest pain, right leg: Secondary | ICD-10-CM

## 2023-06-28 DIAGNOSIS — Z9889 Other specified postprocedural states: Secondary | ICD-10-CM

## 2023-06-28 DIAGNOSIS — I10 Essential (primary) hypertension: Secondary | ICD-10-CM

## 2023-06-28 NOTE — Progress Notes (Incomplete)
Subjective:    Patient ID: Ivan Reyes, male    DOB: Oct 13, 1945, 77 y.o.   MRN: [id number] Chief Complaint  Patient presents with  . Follow-up    6 month ABI    HPI  Review of Systems     Objective:   Physical Exam  BP (!) 151/77 (BP Location: Left Arm)   Pulse 71   Resp 18   Ht 6\' 1"  (1.854 m)   Wt 168 lb 3.2 oz (76.3 kg)   BMI 22.19 kg/m   Past Medical History:  Diagnosis Date  . Arthritis   . Coronary artery disease   . GERD (gastroesophageal reflux disease)   . Hyperlipidemia   . Hypertension   . Mitral valve disorder   . Peripheral vascular disease (HCC)   . Sleep apnea    use C-PAP    Social History   Socioeconomic History  . Marital status: Widowed    Spouse name: Not on file  . Number of children: Not on file  . Years of education: Not on file  . Highest education level: Not on file  Occupational History  . Not on file  Tobacco Use  . Smoking status: Every Day    Current packs/day: 0.50    Average packs/day: 0.5 packs/day for 60.0 years (30.0 ttl pk-yrs)    Types: Cigarettes  . Smokeless tobacco: Never  . Tobacco comments:    since age 71  Vaping Use  . Vaping status: Never Used  Substance and Sexual Activity  . Alcohol use: Yes    Alcohol/week: 4.0 standard drinks of alcohol    Types: 2 Cans of beer, 2 Standard drinks or equivalent per week    Comment: 4/ week  . Drug use: No  . Sexual activity: Not on file  Other Topics Concern  . Not on file  Social History Narrative  . Not on file   Social Determinants of Health   Financial Resource Strain: Not on file  Food Insecurity: No Food Insecurity (06/11/2022)   Hunger Vital Sign   . Worried About Programme researcher, broadcasting/film/video in the Last Year: Never true   . Ran Out of Food in the Last Year: Never true  Transportation Needs: No Transportation Needs (06/11/2022)   PRAPARE - Transportation   . Lack of Transportation (Medical): No   . Lack of Transportation (Non-Medical): No  Physical  Activity: Not on file  Stress: Not on file  Social Connections: Not on file  Intimate Partner Violence: Not At Risk (06/11/2022)   Humiliation, Afraid, Rape, and Kick questionnaire   . Fear of Current or Ex-Partner: No   . Emotionally Abused: No   . Physically Abused: No   . Sexually Abused: No    Past Surgical History:  Procedure Laterality Date  . BROW LIFT Bilateral 07/24/2020   Procedure: BLEPHAROPLASTY UPPER EYELID; W/EXCESS SKIN BILATERAL;  Surgeon: Imagene Riches, MD;  Location: [facility];  Service: Ophthalmology;  Laterality: Bilateral;  sleep apnea  . CAROTID ENDARTERECTOMY Bilateral    Dr. Gilda Crease, [facility]  . CARPAL TUNNEL RELEASE Left 12/23/2016   Procedure: CARPAL TUNNEL RELEASE ENDOSCOPIC;  Surgeon: Christena Flake, MD;  Location: [facility] ORS;  Service: Orthopedics;  Laterality: Left;  . CARPAL TUNNEL RELEASE Right 01/13/2017   Procedure: CARPAL TUNNEL RELEASE ENDOSCOPIC;  Surgeon: Christena Flake, MD;  Location: [facility] OR[facility];  Service: Orthopedics;  Laterality: Right;  . COLONOSCOPY WITH PROPOFOL N/A 09/04/2018   Procedure: COLONOSCOPY WITH PROPOFOL;  Surgeon: Scot Jun, MD;  Location: [facility] ENDOSCOPY;  Service: Endoscopy;  Laterality: N/A;  . CORONARY ANGIOPLASTY     3 stents  . coronary atherosclerosis of  autologous graft    . ESOPHAGOGASTRODUODENOSCOPY (EGD) WITH PROPOFOL N/A 12/12/2017   Procedure: ESOPHAGOGASTRODUODENOSCOPY (EGD) WITH PROPOFOL;  Surgeon: Scot Jun, MD;  Location: [facility] ENDOSCOPY;  Service: Endoscopy;  Laterality: N/A;  . ESOPHAGOGASTRODUODENOSCOPY (EGD) WITH PROPOFOL N/A 09/04/2018   Procedure: ESOPHAGOGASTRODUODENOSCOPY (EGD) WITH PROPOFOL;  Surgeon: Scot Jun, MD;  Location: [facility] ENDOSCOPY;  Service: Endoscopy;  Laterality: N/A;  . EYE SURGERY Bilateral    Cataract Extraction with IOL  . FRACTURE SURGERY Right    Hip Pinning, Dr. Neva Seat, Jr  . JOINT REPLACEMENT     left knee  . KNEE ARTHROPLASTY Left 06/30/2016    Procedure: COMPUTER ASSISTED TOTAL KNEE ARTHROPLASTY;  Surgeon: Donato Heinz, MD;  Location: [facility] ORS;  Service: Orthopedics;  Laterality: Left;  . LOWER EXTREMITY ANGIOGRAPHY Left 05/06/2020   Procedure: LOWER EXTREMITY ANGIOGRAPHY;  Surgeon: Renford Dills, MD;  Location: [facility] INVASIVE CV LAB;  Service: Cardiovascular;  Laterality: Left;  Marland Kitchen MASS EXCISION Left 01/11/2018   Procedure: EXCISION CYSTIC MASS ANTERIOR KNEE;  Surgeon: Donato Heinz, MD;  Location: [facility] ORS;  Service: Orthopedics;  Laterality: Left;    History reviewed. No pertinent family history.  No Known Allergies     Latest Ref Rng & Units 12/20/2022    1:01 PM 09/21/2022    1:41 PM 06/11/2022    2:01 PM  CBC  WBC 4.0 - 10.5 K/uL 8.2  8.0  10.9   Hemoglobin 13.0 - 17.0 g/dL 40.9  81.1  91.4   Hematocrit 39.0 - 52.0 % 40.0  39.6  30.9   Platelets 150 - 400 K/uL 216  246  253       CMP     Component Value Date/Time   NA 140 06/11/2022 1401   K 3.9 06/11/2022 1401   K 4.1 06/28/2014 1603   CL 109 06/11/2022 1401   CO2 22 06/11/2022 1401   GLUCOSE 127 (H) 06/11/2022 1401   BUN 19 06/11/2022 1401   CREATININE 0.98 06/11/2022 1401   CALCIUM 9.5 06/11/2022 1401   PROT 7.1 06/11/2022 1401   ALBUMIN 3.8 06/11/2022 1401   AST 29 06/11/2022 1401   ALT 15 06/11/2022 1401   ALKPHOS 47 06/11/2022 1401   BILITOT 0.5 06/11/2022 1401   GFRNONAA >60 06/11/2022 1401     No results found.     Assessment & Plan:   1. Atherosclerosis of native artery of right lower extremity with rest pain (HCC) ***  2. Primary hypertension Continue antihypertensive medications as already ordered, these medications have been reviewed and there are no changes at this time.  3. Bilateral carotid artery stenosis ***   Current Outpatient Medications on File Prior to Visit  Medication Sig Dispense Refill  . clopidogrel (PLAVIX) 75 MG tablet Take 75 mg by mouth daily.    . enalapril (VASOTEC) 20 MG tablet Take 20 mg by mouth 2  (two) times daily.    . fenofibrate micronized (LOFIBRA) 134 MG capsule Take 134 mg by mouth daily before breakfast.     . finasteride (PROSCAR) 5 MG tablet Take 5 mg by mouth daily.    . Glucosamine HCl (GLUCOSAMINE PO) Take 1 tablet by mouth 2 (two) times daily.    . pantoprazole (PROTONIX) 20 MG tablet Take 20 mg by mouth every morning.    Marland Kitchen  sildenafil (REVATIO) 20 MG tablet Take 20-40 mg by mouth daily as needed. Pt states 2 morning and night    . simvastatin (ZOCOR) 40 MG tablet Take 40 mg by mouth daily at 6 PM.     . tadalafil (CIALIS) 20 MG tablet Take 20 mg by mouth daily as needed.    . tamsulosin (FLOMAX) 0.4 MG CAPS capsule Take 1 capsule (0.4 mg total) by mouth daily. 90 capsule 3  . Cholecalciferol (VITAMIN D) 2000 units CAPS Take 2,000 Units by mouth every evening. (Patient not taking: Reported on 06/28/2023)    . Multiple Vitamins-Minerals (PX COMPLETE SENIOR MULTIVITS) TABS Take 1 tablet by mouth. (Patient not taking: Reported on 06/28/2023)    . Omega-3 Fatty Acids (OMEGA-3 FISH OIL) 300 MG CAPS Take 300 mg by mouth 2 (two) times daily. (Patient not taking: Reported on 06/28/2023)    . vitamin B-12 (CYANOCOBALAMIN) 1000 MCG tablet Take 1,000 mcg by mouth daily. (Patient not taking: Reported on 06/28/2023)    . vitamin E 400 UNIT capsule Take 400 Units by mouth every evening.  (Patient not taking: Reported on 06/28/2023)     No current facility-administered medications on file prior to visit.    There are no Patient Instructions on file for this visit. No follow-ups on file.   Georgiana Spinner, NP

## 2023-06-28 NOTE — H&P (View-Only) (Signed)
Subjective:    Patient ID: Ivan Reyes, male    DOB: 05-Apr-1946, 77 y.o.   MRN: [id number] Chief Complaint  Patient presents with   Follow-up    6 month ABI    The patient returns to the office for followup of atherosclerotic changes of the lower extremities and review of the noninvasive studies.   The patient notes that there has been a significant deterioration in the right lower extremity symptoms.  The patient notes interval shortening of their claudication distance and development of mild rest pain symptoms of the right lower extremity. No new ulcers or wounds have occurred since the last visit.  He notes his left lower extremity continues not to have significant worsening of his claudication symptoms.  There have been no significant changes to the patient's overall health care.  The patient denies amaurosis fugax or recent TIA symptoms. There are no recent neurological changes noted. There is no history of DVT, PE or superficial thrombophlebitis. The patient denies recent episodes of angina or shortness of breath.   ABI's Rt= 0.88 and Lt= 0.67 (previous ABI's Rt= 1.21 and Lt= 0.76) Duplex US of the lower extremity arterial system shows the patient previously had triphasic tibial artery waveforms on the right and today there are monophasic.  He had biphasic/monophasic waveforms in the left and today they are monophasic as well.    Review of Systems  Cardiovascular:        Claudication  All other systems reviewed and are negative.      Objective:   Physical Exam Vitals reviewed.  HENT:     Head: Normocephalic.  Cardiovascular:     Rate and Rhythm: Normal rate.     Pulses:          Dorsalis pedis pulses are detected w/ Doppler on the right side and detected w/ Doppler on the left side.       Posterior tibial pulses are detected w/ Doppler on the right side and detected w/ Doppler on the left side.  Pulmonary:     Effort: Pulmonary effort is normal.  Skin:     General: Skin is warm and dry.  Neurological:     Mental Status: He is alert and oriented to person, place, and time.  Psychiatric:        Mood and Affect: Mood normal.        Behavior: Behavior normal.        Thought Content: Thought content normal.        Judgment: Judgment normal.     BP (!) 151/77 (BP Location: Left Arm)   Pulse 71   Resp 18   Ht 6\' 1"  (1.854 m)   Wt 168 lb 3.2 oz (76.3 kg)   BMI 22.19 kg/m   Past Medical History:  Diagnosis Date   Arthritis    Coronary artery disease    GERD (gastroesophageal reflux disease)    Hyperlipidemia    Hypertension    Mitral valve disorder    Peripheral vascular disease (HCC)    Sleep apnea    use C-PAP    Social History   Socioeconomic History   Marital status: Widowed    Spouse name: Not on file   Number of children: Not on file   Years of education: Not on file   Highest education level: Not on file  Occupational History   Not on file  Tobacco Use   Smoking status: Every Day    Current packs/day: 0.50  Average packs/day: 0.5 packs/day for 60.0 years (30.0 ttl pk-yrs)    Types: Cigarettes   Smokeless tobacco: Never   Tobacco comments:    since age 63  Vaping Use   Vaping status: Never Used  Substance and Sexual Activity   Alcohol use: Yes    Alcohol/week: 4.0 standard drinks of alcohol    Types: 2 Cans of beer, 2 Standard drinks or equivalent per week    Comment: 4/ week   Drug use: No   Sexual activity: Not on file  Other Topics Concern   Not on file  Social History Narrative   Not on file   Social Determinants of Health   Financial Resource Strain: Not on file  Food Insecurity: No Food Insecurity (06/11/2022)   Hunger Vital Sign    Worried About Running Out of Food in the Last Year: Never true    Ran Out of Food in the Last Year: Never true  Transportation Needs: No Transportation Needs (06/11/2022)   PRAPARE - Administrator, Civil Service (Medical): No    Lack of  Transportation (Non-Medical): No  Physical Activity: Not on file  Stress: Not on file  Social Connections: Not on file  Intimate Partner Violence: Not At Risk (06/11/2022)   Humiliation, Afraid, Rape, and Kick questionnaire    Fear of Current or Ex-Partner: No    Emotionally Abused: No    Physically Abused: No    Sexually Abused: No    Past Surgical History:  Procedure Laterality Date   BROW LIFT Bilateral 07/24/2020   Procedure: BLEPHAROPLASTY UPPER EYELID; W/EXCESS SKIN BILATERAL;  Surgeon: Imagene Riches, MD;  Location: [facility];  Service: Ophthalmology;  Laterality: Bilateral;  sleep apnea   CAROTID ENDARTERECTOMY Bilateral    Dr. Gilda Crease, [facility]   CARPAL TUNNEL RELEASE Left 12/23/2016   Procedure: CARPAL TUNNEL RELEASE ENDOSCOPIC;  Surgeon: Christena Flake, MD;  Location: [facility] ORS;  Service: Orthopedics;  Laterality: Left;   CARPAL TUNNEL RELEASE Right 01/13/2017   Procedure: CARPAL TUNNEL RELEASE ENDOSCOPIC;  Surgeon: Christena Flake, MD;  Location: [facility] ORS;  Service: Orthopedics;  Laterality: Right;   COLONOSCOPY WITH PROPOFOL N/A 09/04/2018   Procedure: COLONOSCOPY WITH PROPOFOL;  Surgeon: Scot Jun, MD;  Location: [facility] ENDOSCOPY;  Service: Endoscopy;  Laterality: N/A;   CORONARY ANGIOPLASTY     3 stents   coronary atherosclerosis of  autologous graft     ESOPHAGOGASTRODUODENOSCOPY (EGD) WITH PROPOFOL N/A 12/12/2017   Procedure: ESOPHAGOGASTRODUODENOSCOPY (EGD) WITH PROPOFOL;  Surgeon: Scot Jun, MD;  Location: [facility] ENDOSCOPY;  Service: Endoscopy;  Laterality: N/A;   ESOPHAGOGASTRODUODENOSCOPY (EGD) WITH PROPOFOL N/A 09/04/2018   Procedure: ESOPHAGOGASTRODUODENOSCOPY (EGD) WITH PROPOFOL;  Surgeon: Scot Jun, MD;  Location: [facility] ENDOSCOPY;  Service: Endoscopy;  Laterality: N/A;   EYE SURGERY Bilateral    Cataract Extraction with IOL   FRACTURE SURGERY Right    Hip Pinning, Dr. Neva Seat, Jr   JOINT REPLACEMENT     left knee   KNEE ARTHROPLASTY  Left 06/30/2016   Procedure: COMPUTER ASSISTED TOTAL KNEE ARTHROPLASTY;  Surgeon: Donato Heinz, MD;  Location: [facility] ORS;  Service: Orthopedics;  Laterality: Left;   LOWER EXTREMITY ANGIOGRAPHY Left 05/06/2020   Procedure: LOWER EXTREMITY ANGIOGRAPHY;  Surgeon: Renford Dills, MD;  Location: [facility] INVASIVE CV LAB;  Service: Cardiovascular;  Laterality: Left;   MASS EXCISION Left 01/11/2018   Procedure: EXCISION CYSTIC MASS ANTERIOR KNEE;  Surgeon: Donato Heinz, MD;  Location:  [facility] ORS;  Service: Orthopedics;  Laterality: Left;    History reviewed. No pertinent family history.  No Known Allergies     Latest Ref Rng & Units 12/20/2022    1:01 PM 09/21/2022    1:41 PM 06/11/2022    2:01 PM  CBC  WBC 4.0 - 10.5 K/uL 8.2  8.0  10.9   Hemoglobin 13.0 - 17.0 g/dL 60.4  54.0  98.1   Hematocrit 39.0 - 52.0 % 40.0  39.6  30.9   Platelets 150 - 400 K/uL 216  246  253       CMP     Component Value Date/Time   NA 140 06/11/2022 1401   K 3.9 06/11/2022 1401   K 4.1 06/28/2014 1603   CL 109 06/11/2022 1401   CO2 22 06/11/2022 1401   GLUCOSE 127 (H) 06/11/2022 1401   BUN 19 06/11/2022 1401   CREATININE 0.98 06/11/2022 1401   CALCIUM 9.5 06/11/2022 1401   PROT 7.1 06/11/2022 1401   ALBUMIN 3.8 06/11/2022 1401   AST 29 06/11/2022 1401   ALT 15 06/11/2022 1401   ALKPHOS 47 06/11/2022 1401   BILITOT 0.5 06/11/2022 1401   GFRNONAA >60 06/11/2022 1401     No results found.     Assessment & Plan:   1. Atherosclerosis of native artery of right lower extremity with rest pain (HCC) Recommend:  The patient has experienced increased claudication symptoms and is now describing lifestyle limiting claudication and appears to be having mild rest pain symptroms.  Given the severity of the patient's severe right lower extremity symptoms the patient should undergo angiography with the hope for intervention.  Risk and benefits were reviewed the patient.  Indications for the procedure were  reviewed.  All questions were answered, the patient agrees to proceed with right lower extremity angiography and possible intervention.   The patient should continue walking and begin a more formal exercise program.  The patient should continue antiplatelet therapy and aggressive treatment of the lipid abnormalities  The patient will follow up with me after the angiogram.   2. Primary hypertension Continue antihypertensive medications as already ordered, these medications have been reviewed and there are no changes at this time.  3. Bilateral carotid artery stenosis The patient had mild carotid stenosis at last visit.  Will reevaluate in 6 months   Current Outpatient Medications on File Prior to Visit  Medication Sig Dispense Refill   clopidogrel (PLAVIX) 75 MG tablet Take 75 mg by mouth daily.     enalapril (VASOTEC) 20 MG tablet Take 20 mg by mouth 2 (two) times daily.     fenofibrate micronized (LOFIBRA) 134 MG capsule Take 134 mg by mouth daily before breakfast.      finasteride (PROSCAR) 5 MG tablet Take 5 mg by mouth daily.     Glucosamine HCl (GLUCOSAMINE PO) Take 1 tablet by mouth 2 (two) times daily.     pantoprazole (PROTONIX) 20 MG tablet Take 20 mg by mouth every morning.     sildenafil (REVATIO) 20 MG tablet Take 20-40 mg by mouth daily as needed. Pt states 2 morning and night     simvastatin (ZOCOR) 40 MG tablet Take 40 mg by mouth daily at 6 PM.      tadalafil (CIALIS) 20 MG tablet Take 20 mg by mouth daily as needed.     tamsulosin (FLOMAX) 0.4 MG CAPS capsule Take 1 capsule (0.4 mg total) by mouth daily. 90 capsule 3   Cholecalciferol (VITAMIN  D) 2000 units CAPS Take 2,000 Units by mouth every evening. (Patient not taking: Reported on 06/28/2023)     Multiple Vitamins-Minerals (PX COMPLETE SENIOR MULTIVITS) TABS Take 1 tablet by mouth. (Patient not taking: Reported on 06/28/2023)     Omega-3 Fatty Acids (OMEGA-3 FISH OIL) 300 MG CAPS Take 300 mg by mouth 2 (two) times  daily. (Patient not taking: Reported on 06/28/2023)     vitamin B-12 (CYANOCOBALAMIN) 1000 MCG tablet Take 1,000 mcg by mouth daily. (Patient not taking: Reported on 06/28/2023)     vitamin E 400 UNIT capsule Take 400 Units by mouth every evening.  (Patient not taking: Reported on 06/28/2023)     No current facility-administered medications on file prior to visit.    There are no Patient Instructions on file for this visit. No follow-ups on file.   Georgiana Spinner, NP

## 2023-06-28 NOTE — Progress Notes (Signed)
Subjective:    Patient ID: Ivan Reyes, male    DOB: [birth year], 77 y.o.   MRN: [id number] Chief Complaint  Patient presents with   Follow-up    6 month ABI    The patient returns to the office for followup of atherosclerotic changes of the lower extremities and review of the noninvasive studies.   The patient notes that there has been a significant deterioration in the right lower extremity symptoms.  The patient notes interval shortening of their claudication distance and development of mild rest pain symptoms of the right lower extremity. No new ulcers or wounds have occurred since the last visit.  He notes his left lower extremity continues not to have significant worsening of his claudication symptoms.  There have been no significant changes to the patient's overall health care.  The patient denies amaurosis fugax or recent TIA symptoms. There are no recent neurological changes noted. There is no history of DVT, PE or superficial thrombophlebitis. The patient denies recent episodes of angina or shortness of breath.   ABI's Rt= 0.88 and Lt= 0.67 (previous ABI's Rt= 1.21 and Lt= 0.76) Duplex US of the lower extremity arterial system shows the patient previously had triphasic tibial artery waveforms on the right and today there are monophasic.  He had biphasic/monophasic waveforms in the left and today they are monophasic as well.    Review of Systems  Cardiovascular:        Claudication  All other systems reviewed and are negative.      Objective:   Physical Exam Vitals reviewed.  HENT:     Head: Normocephalic.  Cardiovascular:     Rate and Rhythm: Normal rate.     Pulses:          Dorsalis pedis pulses are detected w/ Doppler on the right side and detected w/ Doppler on the left side.       Posterior tibial pulses are detected w/ Doppler on the right side and detected w/ Doppler on the left side.  Pulmonary:     Effort: Pulmonary effort is normal.  Skin:     General: Skin is warm and dry.  Neurological:     Mental Status: He is alert and oriented to person, place, and time.  Psychiatric:        Mood and Affect: Mood normal.        Behavior: Behavior normal.        Thought Content: Thought content normal.        Judgment: Judgment normal.     BP (!) 151/77 (BP Location: Left Arm)   Pulse 71   Resp 18   Ht 6\' 1"  (1.854 m)   Wt 168 lb 3.2 oz (76.3 kg)   BMI 22.19 kg/m   Past Medical History:  Diagnosis Date   Arthritis    Coronary artery disease    GERD (gastroesophageal reflux disease)    Hyperlipidemia    Hypertension    Mitral valve disorder    Peripheral vascular disease (HCC)    Sleep apnea    use C-PAP    Social History   Socioeconomic History   Marital status: Widowed    Spouse name: Not on file   Number of children: Not on file   Years of education: Not on file   Highest education level: Not on file  Occupational History   Not on file  Tobacco Use   Smoking status: Every Day    Current packs/day: 0.50  Average packs/day: 0.5 packs/day for 60.0 years (30.0 ttl pk-yrs)    Types: Cigarettes   Smokeless tobacco: Never   Tobacco comments:    since age 65  Vaping Use   Vaping status: Never Used  Substance and Sexual Activity   Alcohol use: Yes    Alcohol/week: 4.0 standard drinks of alcohol    Types: 2 Cans of beer, 2 Standard drinks or equivalent per week    Comment: 4/ week   Drug use: No   Sexual activity: Not on file  Other Topics Concern   Not on file  Social History Narrative   Not on file   Social Determinants of Health   Financial Resource Strain: Not on file  Food Insecurity: No Food Insecurity (06/11/2022)   Hunger Vital Sign    Worried About Running Out of Food in the Last Year: Never true    Ran Out of Food in the Last Year: Never true  Transportation Needs: No Transportation Needs (06/11/2022)   PRAPARE - Administrator, Civil Service (Medical): No    Lack of  Transportation (Non-Medical): No  Physical Activity: Not on file  Stress: Not on file  Social Connections: Not on file  Intimate Partner Violence: Not At Risk (06/11/2022)   Humiliation, Afraid, Rape, and Kick questionnaire    Fear of Current or Ex-Partner: No    Emotionally Abused: No    Physically Abused: No    Sexually Abused: No    Past Surgical History:  Procedure Laterality Date   BROW LIFT Bilateral 07/24/2020   Procedure: BLEPHAROPLASTY UPPER EYELID; W/EXCESS SKIN BILATERAL;  Surgeon: Imagene Riches, MD;  Location: [facility];  Service: Ophthalmology;  Laterality: Bilateral;  sleep apnea   CAROTID ENDARTERECTOMY Bilateral    Dr. Gilda Crease, [facility]   CARPAL TUNNEL RELEASE Left 12/23/2016   Procedure: CARPAL TUNNEL RELEASE ENDOSCOPIC;  Surgeon: Christena Flake, MD;  Location: [facility] ORS;  Service: Orthopedics;  Laterality: Left;   CARPAL TUNNEL RELEASE Right 01/13/2017   Procedure: CARPAL TUNNEL RELEASE ENDOSCOPIC;  Surgeon: Christena Flake, MD;  Location: [facility] ORS;  Service: Orthopedics;  Laterality: Right;   COLONOSCOPY WITH PROPOFOL N/A 09/04/2018   Procedure: COLONOSCOPY WITH PROPOFOL;  Surgeon: Scot Jun, MD;  Location: [facility] ENDOSCOPY;  Service: Endoscopy;  Laterality: N/A;   CORONARY ANGIOPLASTY     3 stents   coronary atherosclerosis of  autologous graft     ESOPHAGOGASTRODUODENOSCOPY (EGD) WITH PROPOFOL N/A 12/12/2017   Procedure: ESOPHAGOGASTRODUODENOSCOPY (EGD) WITH PROPOFOL;  Surgeon: Scot Jun, MD;  Location: [facility] ENDOSCOPY;  Service: Endoscopy;  Laterality: N/A;   ESOPHAGOGASTRODUODENOSCOPY (EGD) WITH PROPOFOL N/A 09/04/2018   Procedure: ESOPHAGOGASTRODUODENOSCOPY (EGD) WITH PROPOFOL;  Surgeon: Scot Jun, MD;  Location: [facility] ENDOSCOPY;  Service: Endoscopy;  Laterality: N/A;   EYE SURGERY Bilateral    Cataract Extraction with IOL   FRACTURE SURGERY Right    Hip Pinning, Dr. Neva Seat, Jr   JOINT REPLACEMENT     left knee   KNEE ARTHROPLASTY  Left 06/30/2016   Procedure: COMPUTER ASSISTED TOTAL KNEE ARTHROPLASTY;  Surgeon: Donato Heinz, MD;  Location: [facility] ORS;  Service: Orthopedics;  Laterality: Left;   LOWER EXTREMITY ANGIOGRAPHY Left 05/06/2020   Procedure: LOWER EXTREMITY ANGIOGRAPHY;  Surgeon: Renford Dills, MD;  Location: [facility] INVASIVE CV LAB;  Service: Cardiovascular;  Laterality: Left;   MASS EXCISION Left 01/11/2018   Procedure: EXCISION CYSTIC MASS ANTERIOR KNEE;  Surgeon: Donato Heinz, MD;  Location:  [facility] ORS;  Service: Orthopedics;  Laterality: Left;    History reviewed. No pertinent family history.  No Known Allergies     Latest Ref Rng & Units 12/20/2022    1:01 PM 09/21/2022    1:41 PM 06/11/2022    2:01 PM  CBC  WBC 4.0 - 10.5 K/uL 8.2  8.0  10.9   Hemoglobin 13.0 - 17.0 g/dL 16.1  09.6  04.5   Hematocrit 39.0 - 52.0 % 40.0  39.6  30.9   Platelets 150 - 400 K/uL 216  246  253       CMP     Component Value Date/Time   NA 140 06/11/2022 1401   K 3.9 06/11/2022 1401   K 4.1 06/28/2014 1603   CL 109 06/11/2022 1401   CO2 22 06/11/2022 1401   GLUCOSE 127 (H) 06/11/2022 1401   BUN 19 06/11/2022 1401   CREATININE 0.98 06/11/2022 1401   CALCIUM 9.5 06/11/2022 1401   PROT 7.1 06/11/2022 1401   ALBUMIN 3.8 06/11/2022 1401   AST 29 06/11/2022 1401   ALT 15 06/11/2022 1401   ALKPHOS 47 06/11/2022 1401   BILITOT 0.5 06/11/2022 1401   GFRNONAA >60 06/11/2022 1401     No results found.     Assessment & Plan:   1. Atherosclerosis of native artery of right lower extremity with rest pain (HCC) Recommend:  The patient has experienced increased claudication symptoms and is now describing lifestyle limiting claudication and appears to be having mild rest pain symptroms.  Given the severity of the patient's severe right lower extremity symptoms the patient should undergo angiography with the hope for intervention.  Risk and benefits were reviewed the patient.  Indications for the procedure were  reviewed.  All questions were answered, the patient agrees to proceed with right lower extremity angiography and possible intervention.   The patient should continue walking and begin a more formal exercise program.  The patient should continue antiplatelet therapy and aggressive treatment of the lipid abnormalities  The patient will follow up with me after the angiogram.   2. Primary hypertension Continue antihypertensive medications as already ordered, these medications have been reviewed and there are no changes at this time.  3. Bilateral carotid artery stenosis The patient had mild carotid stenosis at last visit.  Will reevaluate in 6 months   Current Outpatient Medications on File Prior to Visit  Medication Sig Dispense Refill   clopidogrel (PLAVIX) 75 MG tablet Take 75 mg by mouth daily.     enalapril (VASOTEC) 20 MG tablet Take 20 mg by mouth 2 (two) times daily.     fenofibrate micronized (LOFIBRA) 134 MG capsule Take 134 mg by mouth daily before breakfast.      finasteride (PROSCAR) 5 MG tablet Take 5 mg by mouth daily.     Glucosamine HCl (GLUCOSAMINE PO) Take 1 tablet by mouth 2 (two) times daily.     pantoprazole (PROTONIX) 20 MG tablet Take 20 mg by mouth every morning.     sildenafil (REVATIO) 20 MG tablet Take 20-40 mg by mouth daily as needed. Pt states 2 morning and night     simvastatin (ZOCOR) 40 MG tablet Take 40 mg by mouth daily at 6 PM.      tadalafil (CIALIS) 20 MG tablet Take 20 mg by mouth daily as needed.     tamsulosin (FLOMAX) 0.4 MG CAPS capsule Take 1 capsule (0.4 mg total) by mouth daily. 90 capsule 3   Cholecalciferol (VITAMIN  D) 2000 units CAPS Take 2,000 Units by mouth every evening. (Patient not taking: Reported on 06/28/2023)     Multiple Vitamins-Minerals (PX COMPLETE SENIOR MULTIVITS) TABS Take 1 tablet by mouth. (Patient not taking: Reported on 06/28/2023)     Omega-3 Fatty Acids (OMEGA-3 FISH OIL) 300 MG CAPS Take 300 mg by mouth 2 (two) times  daily. (Patient not taking: Reported on 06/28/2023)     vitamin B-12 (CYANOCOBALAMIN) 1000 MCG tablet Take 1,000 mcg by mouth daily. (Patient not taking: Reported on 06/28/2023)     vitamin E 400 UNIT capsule Take 400 Units by mouth every evening.  (Patient not taking: Reported on 06/28/2023)     No current facility-administered medications on file prior to visit.    There are no Patient Instructions on file for this visit. No follow-ups on file.   Georgiana Spinner, NP

## 2023-06-29 ENCOUNTER — Telehealth (INDEPENDENT_AMBULATORY_CARE_PROVIDER_SITE_OTHER): Payer: Self-pay

## 2023-06-29 ENCOUNTER — Inpatient Hospital Stay: Payer: PPO | Attending: Oncology

## 2023-06-29 DIAGNOSIS — F17210 Nicotine dependence, cigarettes, uncomplicated: Secondary | ICD-10-CM | POA: Insufficient documentation

## 2023-06-29 DIAGNOSIS — I10 Essential (primary) hypertension: Secondary | ICD-10-CM | POA: Insufficient documentation

## 2023-06-29 DIAGNOSIS — D509 Iron deficiency anemia, unspecified: Secondary | ICD-10-CM | POA: Insufficient documentation

## 2023-06-29 NOTE — Telephone Encounter (Signed)
Spoke with the patient and he is scheduled with Dr. Gilda Crease for a RLE angio on 07/12/23 with a 8:30 am arrival time to the [facility]. Pre-procedure instructions were discussed and will be sent to Mychart and mailed.

## 2023-06-30 ENCOUNTER — Encounter: Payer: Self-pay | Admitting: Oncology

## 2023-06-30 ENCOUNTER — Inpatient Hospital Stay: Payer: PPO

## 2023-06-30 ENCOUNTER — Inpatient Hospital Stay: Payer: PPO | Admitting: Oncology

## 2023-06-30 VITALS — BP 152/64 | HR 67 | Temp 97.4°F | Resp 16 | Ht 73.0 in | Wt 169.9 lb

## 2023-06-30 DIAGNOSIS — D509 Iron deficiency anemia, unspecified: Secondary | ICD-10-CM | POA: Diagnosis not present

## 2023-06-30 DIAGNOSIS — I10 Essential (primary) hypertension: Secondary | ICD-10-CM | POA: Diagnosis not present

## 2023-06-30 DIAGNOSIS — F17210 Nicotine dependence, cigarettes, uncomplicated: Secondary | ICD-10-CM | POA: Diagnosis not present

## 2023-06-30 LAB — CBC WITH DIFFERENTIAL/PLATELET
Abs Immature Granulocytes: 0.04 10*3/uL (ref 0.00–0.07)
Basophils Absolute: 0.1 10*3/uL (ref 0.0–0.1)
Basophils Relative: 1 %
Eosinophils Absolute: 0.2 10*3/uL (ref 0.0–0.5)
Eosinophils Relative: 2 %
HCT: 41 % (ref 39.0–52.0)
Hemoglobin: 14 g/dL (ref 13.0–17.0)
Immature Granulocytes: 1 %
Lymphocytes Relative: 18 %
Lymphs Abs: 1.4 10*3/uL (ref 0.7–4.0)
MCH: 31.2 pg (ref 26.0–34.0)
MCHC: 34.1 g/dL (ref 30.0–36.0)
MCV: 91.3 fL (ref 80.0–100.0)
Monocytes Absolute: 0.7 10*3/uL (ref 0.1–1.0)
Monocytes Relative: 9 %
Neutro Abs: 5.3 10*3/uL (ref 1.7–7.7)
Neutrophils Relative %: 69 %
Platelets: 195 10*3/uL (ref 150–400)
RBC: 4.49 MIL/uL (ref 4.22–5.81)
RDW: 13.2 % (ref 11.5–15.5)
WBC: 7.6 10*3/uL (ref 4.0–10.5)
nRBC: 0 % (ref 0.0–0.2)

## 2023-06-30 LAB — IRON AND TIBC
Iron: 77 ug/dL (ref 45–182)
Saturation Ratios: 22 % (ref 17.9–39.5)
TIBC: 349 ug/dL (ref 250–450)
UIBC: 272 ug/dL

## 2023-06-30 LAB — VAS US ABI WITH/WO TBI
Left ABI: 0.67
Right ABI: 0.88

## 2023-06-30 LAB — FERRITIN: Ferritin: 46 ng/mL (ref 24–336)

## 2023-06-30 NOTE — Progress Notes (Signed)
[facility]  [phone]) [phone] Fax:(336) [phone]  ID: Adria Dill OB: 1946-04-02  MR#: [id number]  UVO#:[id number]  Patient Care Team: Gracelyn Nurse, MD as PCP - General (Internal Medicine) Jeralyn Ruths, MD as Consulting Physician (Hematology and Oncology)  CHIEF COMPLAINT: Iron deficiency anemia.  INTERVAL HISTORY: Patient returns to clinic today for repeat laboratory work, further evaluation, and consideration of additional IV Feraheme.  He continues to feel well and remains asymptomatic.  He does not complain of any weakness or fatigue.  He has no neurologic complaints.  He denies any recent fevers or illnesses.  He has a good appetite and denies weight loss.  He denies any chest pain, shortness of breath, cough, or hemoptysis.  He denies any nausea, vomiting, constipation, or diarrhea.  He has no melena or hematochezia.  He has no urinary complaints.  Patient offers no specific complaints today.  REVIEW OF SYSTEMS:   Review of Systems  Constitutional: Negative.  Negative for fever, malaise/fatigue and weight loss.  Respiratory: Negative.  Negative for cough and shortness of breath.   Cardiovascular: Negative.  Negative for chest pain and leg swelling.  Gastrointestinal: Negative.  Negative for abdominal pain, blood in stool and melena.  Genitourinary: Negative.  Negative for dysuria and hematuria.  Musculoskeletal: Negative.  Negative for back pain.  Skin: Negative.  Negative for rash.  Neurological: Negative.  Negative for dizziness, focal weakness, weakness and headaches.  Psychiatric/Behavioral: Negative.  The patient is not nervous/anxious.     As per HPI. Otherwise, a complete review of systems is negative.  PAST MEDICAL HISTORY: Past Medical History:  Diagnosis Date   Arthritis    Coronary artery disease    GERD (gastroesophageal reflux disease)    Hyperlipidemia    Hypertension    Mitral valve disorder    Peripheral vascular  disease (HCC)    Sleep apnea    use C-PAP    PAST SURGICAL HISTORY: Past Surgical History:  Procedure Laterality Date   BROW LIFT Bilateral 07/24/2020   Procedure: BLEPHAROPLASTY UPPER EYELID; W/EXCESS SKIN BILATERAL;  Surgeon: Imagene Riches, MD;  Location: [facility];  Service: Ophthalmology;  Laterality: Bilateral;  sleep apnea   CAROTID ENDARTERECTOMY Bilateral    Dr. Gilda Crease, [facility]   CARPAL TUNNEL RELEASE Left 12/23/2016   Procedure: CARPAL TUNNEL RELEASE ENDOSCOPIC;  Surgeon: Christena Flake, MD;  Location: [facility] ORS;  Service: Orthopedics;  Laterality: Left;   CARPAL TUNNEL RELEASE Right 01/13/2017   Procedure: CARPAL TUNNEL RELEASE ENDOSCOPIC;  Surgeon: Christena Flake, MD;  Location: [facility] ORS;  Service: Orthopedics;  Laterality: Right;   COLONOSCOPY WITH PROPOFOL N/A 09/04/2018   Procedure: COLONOSCOPY WITH PROPOFOL;  Surgeon: Scot Jun, MD;  Location: [facility] ENDOSCOPY;  Service: Endoscopy;  Laterality: N/A;   CORONARY ANGIOPLASTY     3 stents   coronary atherosclerosis of  autologous graft     ESOPHAGOGASTRODUODENOSCOPY (EGD) WITH PROPOFOL N/A 12/12/2017   Procedure: ESOPHAGOGASTRODUODENOSCOPY (EGD) WITH PROPOFOL;  Surgeon: Scot Jun, MD;  Location: [facility] ENDOSCOPY;  Service: Endoscopy;  Laterality: N/A;   ESOPHAGOGASTRODUODENOSCOPY (EGD) WITH PROPOFOL N/A 09/04/2018   Procedure: ESOPHAGOGASTRODUODENOSCOPY (EGD) WITH PROPOFOL;  Surgeon: Scot Jun, MD;  Location: [facility] END[facility]COPY;  Service: Endoscopy;  Laterality: N/A;   EYE SURGERY Bilateral    Cataract Extraction with IOL   FRACTURE SURGERY Right    Hip Pinning, Dr. Neva Seat, Jr   JOINT REPLACEMENT     left knee   KNEE ARTHROPLASTY  Left 06/30/2016   Procedure: COMPUTER ASSISTED TOTAL KNEE ARTHROPLASTY;  Surgeon: Donato Heinz, MD;  Location: [facility] ORS;  Service: Orthopedics;  Laterality: Left;   LOWER EXTREMITY ANGIOGRAPHY Left 05/06/2020   Procedure: LOWER EXTREMITY ANGIOGRAPHY;  Surgeon: Renford Dills, MD;  Location: [facility] INVASIVE CV LAB;  Service: Cardiovascular;  Laterality: Left;   MASS EXCISION Left 01/11/2018   Procedure: EXCISION CYSTIC MASS ANTERIOR KNEE;  Surgeon: Donato Heinz, MD;  Location: [facility] ORS;  Service: Orthopedics;  Laterality: Left;    FAMILY HISTORY: History reviewed. No pertinent family history.  ADVANCED DIRECTIVES (Y/N):  N  HEALTH MAINTENANCE: Social History   Tobacco Use   Smoking status: Every Day    Current packs/day: 0.50    Average packs/day: 0.5 packs/day for 60.0 years (30.0 ttl pk-yrs)    Types: Cigarettes   Smokeless tobacco: Never   Tobacco comments:    since age 21  Vaping Use   Vaping status: Never Used  Substance Use Topics   Alcohol use: Yes    Alcohol/week: 4.0 standard drinks of alcohol    Types: 2 Cans of beer, 2 Standard drinks or equivalent per week    Comment: 4/ week   Drug use: No     Colonoscopy:  PAP:  Bone density:  Lipid panel:  No Known Allergies  Current Outpatient Medications  Medication Sig Dispense Refill   clopidogrel (PLAVIX) 75 MG tablet Take 75 mg by mouth daily.     enalapril (VASOTEC) 20 MG tablet Take 20 mg by mouth 2 (two) times daily.     fenofibrate micronized (LOFIBRA) 134 MG capsule Take 134 mg by mouth daily before breakfast.      finasteride (PROSCAR) 5 MG tablet Take 5 mg by mouth daily.     Glucosamine HCl (GLUCOSAMINE PO) Take 1 tablet by mouth 2 (two) times daily.     pantoprazole (PROTONIX) 20 MG tablet Take 20 mg by mouth every morning.     sildenafil (REVATIO) 20 MG tablet Take 20-40 mg by mouth daily as needed. Pt states 2 morning and night     simvastatin (ZOCOR) 40 MG tablet Take 40 mg by mouth daily at 6 PM.      tadalafil (CIALIS) 20 MG tablet Take 20 mg by mouth daily as needed.     tamsulosin (FLOMAX) 0.4 MG CAPS capsule Take 1 capsule (0.4 mg total) by mouth daily. 90 capsule 3   Cholecalciferol (VITAMIN D) 2000 units CAPS Take 2,000 Units by mouth every evening.  (Patient not taking: Reported on 06/28/2023)     Multiple Vitamins-Minerals (PX COMPLETE SENIOR MULTIVITS) TABS Take 1 tablet by mouth. (Patient not taking: Reported on 06/28/2023)     Omega-3 Fatty Acids (OMEGA-3 FISH OIL) 300 MG CAPS Take 300 mg by mouth 2 (two) times daily. (Patient not taking: Reported on 06/28/2023)     vitamin B-12 (CYANOCOBALAMIN) 1000 MCG tablet Take 1,000 mcg by mouth daily. (Patient not taking: Reported on 06/28/2023)     vitamin E 400 UNIT capsule Take 400 Units by mouth every evening.  (Patient not taking: Reported on 06/28/2023)     No current facility-administered medications for this visit.    OBJECTIVE: Vitals:   06/30/23 1359  BP: (!) 152/64  Pulse: 67  Resp: 16  Temp: (!) 97.4 F (36.3 C)  SpO2: 100%     Body mass index is 22.42 kg/m.    ECOG FS:0 - Asymptomatic  General: Well-developed, well-nourished, no acute distress. Eyes: Pink  conjunctiva, anicteric sclera. HEENT: Normocephalic, moist mucous membranes. Lungs: No audible wheezing or coughing. Heart: Regular rate and rhythm. Abdomen: Soft, nontender, no obvious distention. Musculoskeletal: No edema, cyanosis, or clubbing. Neuro: Alert, answering all questions appropriately. Cranial nerves grossly intact. Skin: No rashes or petechiae noted. Psych: Normal affect.  LAB RESULTS:  Lab Results  Component Value Date   NA 140 06/11/2022   K 3.9 06/11/2022   CL 109 06/11/2022   CO2 22 06/11/2022   GLUCOSE 127 (H) 06/11/2022   BUN 19 06/11/2022   CREATININE 0.98 06/11/2022   CALCIUM 9.5 06/11/2022   PROT 7.1 06/11/2022   ALBUMIN 3.8 06/11/2022   AST 29 06/11/2022   ALT 15 06/11/2022   ALKPHOS 47 06/11/2022   BILITOT 0.5 06/11/2022   GFRNONAA >60 06/11/2022   GFRAA >60 05/06/2020    Lab Results  Component Value Date   WBC 7.6 06/30/2023   NEUTROABS 5.3 06/30/2023   HGB 14.0 06/30/2023   HCT 41.0 06/30/2023   MCV 91.3 06/30/2023   PLT 195 06/30/2023   Lab Results  Component  Value Date   IRON 77 06/30/2023   TIBC 349 06/30/2023   IRONPCTSAT 22 06/30/2023   Lab Results  Component Value Date   FERRITIN 46 06/30/2023     STUDIES: VAS [place] ABI WITH/WO TBI  Result Date: 06/30/2023  LOWER EXTREMITY DOPPLER STUDY Patient Name:  CURTISS TRONE  Date of Exam:   06/28/2023 Medical Rec #: [id number]           Accession #:    [id number] Date of Birth: 23-Jan-1946          Patient Gender: M Patient Age:   77 years Exam Location:  [facility] Procedure:      VAS [place] ABI WITH/WO TBI Referring Phys: Levora Dredge --------------------------------------------------------------------------------  Indications: Peripheral artery disease.  Vascular Interventions: Left SFA multiple stents. Comparison Study: 11/23/2022 Performing Technologist: Debbe Bales RVS  Examination Guidelines: A complete evaluation includes at minimum, Doppler waveform signals and systolic blood pressure reading at the level of bilateral brachial, anterior tibial, and posterior tibial arteries, when vessel segments are accessible. Bilateral testing is considered an integral part of a complete examination. Photoelectric Plethysmograph (PPG) waveforms and toe systolic pressure readings are included as required and additional duplex testing as needed. Limited examinations for reoccurring indications may be performed as noted.  ABI Findings: +---------+------------------+-----+----------+--------+ Right    Rt Pressure (mmHg)IndexWaveform  Comment  +---------+------------------+-----+----------+--------+ Brachial 126                                       +---------+------------------+-----+----------+--------+ ATA      79                0.63 monophasic         +---------+------------------+-----+----------+--------+ PTA      111               0.88 monophasic         +---------+------------------+-----+----------+--------+ PERO     91                0.72 monophasic          +---------+------------------+-----+----------+--------+ Great Toe72                0.57 Abnormal           +---------+------------------+-----+----------+--------+ +---------+------------------+-----+----------+-------+ Left     Lt Pressure (  mmHg)IndexWaveform  Comment +---------+------------------+-----+----------+-------+ Brachial 126                                      +---------+------------------+-----+----------+-------+ ATA      41                0.33 monophasic        +---------+------------------+-----+----------+-------+ PTA      84                0.67 monophasic        +---------+------------------+-----+----------+-------+ PERO     80                0.63 monophasic        +---------+------------------+-----+----------+-------+ Great Toe74                0.59 Abnormal          +---------+------------------+-----+----------+-------+ +-------+-----------+-----------+------------+------------+ ABI/TBIToday's ABIToday's TBIPrevious ABIPrevious TBI +-------+-----------+-----------+------------+------------+ Right  .88        .57        1.21        .76          +-------+-----------+-----------+------------+------------+ Left   .67        .57        .76         .79          +-------+-----------+-----------+------------+------------+  Bilateral TBIs appear decreased compared to prior study on 11/23/2022. Bilateral ABIs appear decreased compared to prior study on 11/23/2022.  Summary: Right: Resting right ankle-brachial index indicates mild right lower extremity arterial disease. The right toe-brachial index is abnormal. Left: Resting left ankle-brachial index indicates moderate left lower extremity arterial disease. The left toe-brachial index is abnormal. *See table(s) above for measurements and observations.  Electronically signed by Levora Dredge MD on 06/30/2023 at 3:34:26 PM.    Final     ASSESSMENT: Iron deficiency anemia.  PLAN:   Iron  deficiency anemia: Resolved.  Patient's hemoglobin and iron stores continue to be within normal limits.  Previously, colonoscopy and upper endoscopy on September 04, 2018 did not reveal any significant pathology.  He does not require additional Feraheme today.  Patient last received treatment on September 28, 2022.  After discussion with the patient, it was determined that no further follow-up is necessary.  Please refer patient back if there are any questions or concerns.   Hypertension: Chronic and unchanged.  Continue evaluation and treatment per primary care.   Patient expressed understanding and was in agreement with this plan. He also understands that He can call clinic at any time with any questions, concerns, or complaints.    Jeralyn Ruths, MD   06/30/2023 3:44 PM

## 2023-07-12 ENCOUNTER — Encounter
Admission: RE | Disposition: A | Discharge status: Home / Self Care | Payer: Self-pay | Source: Home / Self Care | Attending: Vascular Surgery

## 2023-07-12 ENCOUNTER — Ambulatory Visit
Admission: RE | Admit: 2023-07-12 | Discharge: 2023-07-12 | Disposition: A | Discharge status: Home / Self Care | Payer: PPO | Attending: Vascular Surgery | Admitting: Vascular Surgery

## 2023-07-12 ENCOUNTER — Encounter: Payer: Self-pay | Admitting: Vascular Surgery

## 2023-07-12 DIAGNOSIS — I700 Atherosclerosis of aorta: Secondary | ICD-10-CM

## 2023-07-12 DIAGNOSIS — F17210 Nicotine dependence, cigarettes, uncomplicated: Secondary | ICD-10-CM | POA: Diagnosis not present

## 2023-07-12 DIAGNOSIS — I10 Essential (primary) hypertension: Secondary | ICD-10-CM | POA: Insufficient documentation

## 2023-07-12 DIAGNOSIS — I6523 Occlusion and stenosis of bilateral carotid arteries: Secondary | ICD-10-CM | POA: Insufficient documentation

## 2023-07-12 DIAGNOSIS — I70221 Atherosclerosis of native arteries of extremities with rest pain, right leg: Secondary | ICD-10-CM | POA: Insufficient documentation

## 2023-07-12 DIAGNOSIS — I70229 Atherosclerosis of native arteries of extremities with rest pain, unspecified extremity: Secondary | ICD-10-CM

## 2023-07-12 HISTORY — PX: LOWER EXTREMITY ANGIOGRAPHY: CATH118251

## 2023-07-12 LAB — BUN: BUN: 19 mg/dL (ref 8–23)

## 2023-07-12 LAB — CREATININE, SERUM
Creatinine, Ser: 1.08 mg/dL (ref 0.61–1.24)
GFR, Estimated: >60 mL/min (ref >60)

## 2023-07-12 SURGERY — LOWER EXTREMITY ANGIOGRAPHY
Anesthesia: Moderate Sedation | Site: Leg Lower | Laterality: Right

## 2023-07-12 MED ORDER — HEPARIN (PORCINE) IN NACL 1000-0.9 UT/500ML-% IV SOLN
INTRAVENOUS | Status: DC | PRN
  Administered 2023-07-12: 1000 mL

## 2023-07-12 MED ORDER — CEFAZOLIN SODIUM-DEXTROSE 2-4 GM/100ML-% IV SOLN
2.0000 g | INTRAVENOUS | Status: AC
  Administered 2023-07-12: 2 g via INTRAVENOUS

## 2023-07-12 MED ORDER — SODIUM CHLORIDE 0.9% FLUSH: 3.0000 mL | Freq: Two times a day (BID) | INTRAVENOUS | Status: DC

## 2023-07-12 MED ORDER — ONDANSETRON HCL 4 MG/2ML IJ SOLN: 4.0000 mg | Freq: Four times a day (QID) | INTRAMUSCULAR | Status: DC | PRN

## 2023-07-12 MED ORDER — LABETALOL HCL 5 MG/ML IV SOLN: 10.0000 mg | INTRAVENOUS | Status: DC | PRN

## 2023-07-12 MED ORDER — ASPIRIN 81 MG PO TBEC
81.0000 mg | DELAYED_RELEASE_TABLET | Freq: Every day | ORAL | Status: DC
  Administered 2023-07-12: 81 mg via ORAL

## 2023-07-12 MED ORDER — DIPHENHYDRAMINE HCL 50 MG/ML IJ SOLN
INTRAMUSCULAR | Status: DC | PRN
  Administered 2023-07-12: 50 mg via INTRAVENOUS

## 2023-07-12 MED ORDER — SODIUM CHLORIDE 0.9% FLUSH: 3.0000 mL | INTRAVENOUS | Status: DC | PRN

## 2023-07-12 MED ORDER — CEFAZOLIN SODIUM-DEXTROSE 2-4 GM/100ML-% IV SOLN
INTRAVENOUS | Status: AC
  Filled 2023-07-12: qty 100

## 2023-07-12 MED ORDER — FENTANYL CITRATE (PF) 100 MCG/2ML IJ SOLN
INTRAMUSCULAR | Status: AC
  Filled 2023-07-12: qty 2

## 2023-07-12 MED ORDER — SODIUM CHLORIDE 0.9 % IV SOLN: 250.0000 mL | INTRAVENOUS | Status: DC | PRN

## 2023-07-12 MED ORDER — METHYLPREDNISOLONE SODIUM SUCC 125 MG IJ SOLR: 125.0000 mg | Freq: Once | INTRAMUSCULAR | Status: DC | PRN

## 2023-07-12 MED ORDER — FENTANYL CITRATE (PF) 100 MCG/2ML IJ SOLN
INTRAMUSCULAR | Status: DC | PRN
  Administered 2023-07-12: 25 ug via INTRAVENOUS
  Administered 2023-07-12: 50 ug via INTRAVENOUS

## 2023-07-12 MED ORDER — ASPIRIN 81 MG PO TBEC
DELAYED_RELEASE_TABLET | ORAL | Status: AC
  Filled 2023-07-12: qty 1

## 2023-07-12 MED ORDER — MIDAZOLAM HCL 2 MG/2ML IJ SOLN
INTRAMUSCULAR | Status: DC | PRN
  Administered 2023-07-12: 2 mg via INTRAVENOUS

## 2023-07-12 MED ORDER — HYDRALAZINE HCL 20 MG/ML IJ SOLN: 5.0000 mg | INTRAMUSCULAR | Status: DC | PRN

## 2023-07-12 MED ORDER — LIDOCAINE HCL (PF) 1 % IJ SOLN
INTRAMUSCULAR | Status: DC | PRN
  Administered 2023-07-12: 10 mL via INTRADERMAL

## 2023-07-12 MED ORDER — SODIUM CHLORIDE 0.9 % IV SOLN: INTRAVENOUS | Status: DC

## 2023-07-12 MED ORDER — DIPHENHYDRAMINE HCL 50 MG/ML IJ SOLN: 50.0000 mg | Freq: Once | INTRAMUSCULAR | Status: DC | PRN

## 2023-07-12 MED ORDER — MIDAZOLAM HCL 5 MG/5ML IJ SOLN
INTRAMUSCULAR | Status: AC
  Filled 2023-07-12: qty 5

## 2023-07-12 MED ORDER — HYDROMORPHONE HCL 1 MG/ML IJ SOLN: 1.0000 mg | Freq: Once | INTRAMUSCULAR | Status: DC | PRN

## 2023-07-12 MED ORDER — IODIXANOL 320 MG/ML IV SOLN
INTRAVENOUS | Status: DC | PRN
  Administered 2023-07-12: 80 mL via INTRA_ARTERIAL

## 2023-07-12 MED ORDER — HEPARIN SODIUM (PORCINE) 1000 UNIT/ML IJ SOLN
INTRAMUSCULAR | Status: AC
  Filled 2023-07-12: qty 10

## 2023-07-12 MED ORDER — MIDAZOLAM HCL 2 MG/ML PO SYRP: 8.0000 mg | ORAL_SOLUTION | Freq: Once | ORAL | Status: DC | PRN

## 2023-07-12 MED ORDER — FAMOTIDINE 20 MG PO TABS: 40.0000 mg | ORAL_TABLET | Freq: Once | ORAL | Status: DC | PRN

## 2023-07-12 MED ORDER — DIPHENHYDRAMINE HCL 50 MG/ML IJ SOLN
INTRAMUSCULAR | Status: AC
  Filled 2023-07-12: qty 1

## 2023-07-12 MED ORDER — HEPARIN SODIUM (PORCINE) 1000 UNIT/ML IJ SOLN
INTRAMUSCULAR | Status: DC | PRN
  Administered 2023-07-12: 6000 [IU] via INTRAVENOUS

## 2023-07-12 SURGICAL SUPPLY — 30 items
BALLN LUTONIX 018 4X60X130 (BALLOONS) ×1
BALLN LUTONIX 018 5X150X130 (BALLOONS) ×1
BALLN LUTONIX 018 5X220X130 (BALLOONS) ×1
BALLN ULTRASCORE 014 3X40X150 (BALLOONS) ×1
BALLOON LUTONIX 018 4X60X130 (BALLOONS) IMPLANT
BALLOON LUTONIX 018 5X150X130 (BALLOONS) IMPLANT
BALLOON LUTONIX 018 5X220X130 (BALLOONS) IMPLANT
BALLOON ULTRSCRE 014 3X40X150 (BALLOONS) IMPLANT
CATH ANGIO 5F PIGTAIL 65CM (CATHETERS) IMPLANT
CATH ROTAREX 135 6FR (CATHETERS) IMPLANT
CATH VERT 5FR 125CM (CATHETERS) IMPLANT
CATH VERT 5X100 (CATHETERS) IMPLANT
COVER PROBE ULTRASOUND 5X96 (MISCELLANEOUS) IMPLANT
DEVICE PRESTO INFLATION (MISCELLANEOUS) IMPLANT
DEVICE STARCLOSE SE CLOSURE (Vascular Products) IMPLANT
GLIDEWIRE ADV .035X260CM (WIRE) IMPLANT
GOWN STRL REUS W/ TWL LRG LVL3 (GOWN DISPOSABLE) ×1 IMPLANT
NDL ENTRY 21GA 7CM ECHOTIP (NEEDLE) IMPLANT
NEEDLE ENTRY 21GA 7CM ECHOTIP (NEEDLE) ×1 IMPLANT
PACK ANGIOGRAPHY (CUSTOM PROCEDURE TRAY) ×1 IMPLANT
SET INTRO CAPELLA COAXIAL (SET/KITS/TRAYS/PACK) IMPLANT
SHEATH BRITE TIP 5FRX11 (SHEATH) IMPLANT
SHEATH RAABE 6FR (SHEATH) IMPLANT
STENT LIFESTENT 5F 6X150X135 (Permanent Stent) IMPLANT
SYR MEDRAD MARK 7 150ML (SYRINGE) IMPLANT
TUBING CONTRAST HIGH PRESS 72 (TUBING) IMPLANT
WIRE G V18X300CM (WIRE) IMPLANT
WIRE GUIDERIGHT .035X150 (WIRE) IMPLANT
WIRE RUNTHROUGH .014X300CM (WIRE) IMPLANT
WIRE SUPRACORE 300CM (WIRE) IMPLANT

## 2023-07-12 NOTE — Op Note (Signed)
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  Post-operative diagnosis:  Same  Procedure(s) Performed:             1.  Introduction catheter into right lower extremity 3rd order catheter placement               2.    Contrast injection right lower extremity for distal runoff             3.  Percutaneous transluminal angioplasty and stent placement right superficial femoral and popliteal arteries to 5 mm             4.  Rota Rex atherectomy right SFA and above-knee popliteal.               5.  Percutaneous transluminal angioplasty right posterior tibial artery to 4 mm             6.  Star close closure left common femoral arteriotomy  Anesthesia: Conscious sedation was administered under my direct supervision by the interventional radiology RN. IV Versed plus fentanyl were utilized. Continuous ECG, pulse oximetry and blood pressure was monitored throughout the entire procedure.  Conscious sedation was for a total of 61.  Sheath: 6 [place] Rabie left common femoral retrograde  Contrast: 80 cc  Fluoroscopy Time: 11.9 minutes  Indications:  Ivan Reyes presents with increasing pain of the right lower extremity.  Noninvasive studies and physical examination suggest significant deterioration in his arterial status this suggests the patient is having limb threatening ischemia. The risks and benefits are reviewed all questions answered patient agrees to proceed with angiography and the hope for intervention for limb salvage.  Procedure:   SHAHIR WOLAK is a 77 y.o. y.o. male who was identified and appropriate procedural time out was performed.  The patient was then placed supine on the table and prepped and draped in the usual sterile fashion.    Ultrasound  was placed in the sterile sleeve and the left groin was evaluated the left common femoral artery was echolucent and pulsatile indicating patency.  Image was recorded for the permanent record and under real-time visualization a microneedle was inserted into the common femoral artery followed by the microwire and then the micro-sheath.  A J-wire was then advanced through the micro-sheath and a  5 [place] sheath was then inserted over a J-wire. J-wire was then advanced and a 5 French pigtail catheter was positioned at the level of T12.  AP projection of the aorta was then obtained. Pigtail catheter was repositioned to above the bifurcation and a LAO view of the pelvis was obtained.  Subsequently a pigtail catheter with an Advantage wire was used to cross the aortic bifurcation.  The catheter and wire were advanced down into the right distal external iliac artery. Oblique view of the femoral bifurcation was then obtained and subsequently the wire was reintroduced and the pigtail catheter negotiated into the SFA representing third order catheter placement. Distal runoff was then performed.  6000 units of heparin was then given and allowed to circulate for several minutes.  A 6 French Rabie sheath was advanced up and over the bifurcation and positioned in the femoral artery  KMP catheter and advantage Glidewire were then negotiated down into the posterior tibial artery.  Hand injection contrast demonstrated the tibial anatomy in further  detail.  0.014 wire is then advanced under fluoroscopic guidance into the distal posterior tibial artery.  The [place] Rex catheter is prepared on the field and advanced and [place] Rex atherectomy is then performed in the mid and distal SFA extending into the above-knee popliteal.  A total of 3 passes are made.  A 5 mm x 150 mm Lutonix balloon was used to angioplasty the mid and distal SFA as well as the above-knee popliteal.  The inflation was for 1 minute at 10 atm.   The detector was  then positioned distally. A 3 mm x 40 mm ultra score balloon was advanced into the proximal posterior tibial.  Inflation was to 10 atmospheres for 1 minute. Follow-up imaging demonstrated patency with greater than 20% residual stenosis.  Therefore a 4 mm x 60 mm Lutonix drug-eluting balloon was advanced across the lesion inflated to 6 atm for 1 minute.  Follow-up imaging demonstrated an area in the posterior tibial just below the treated area and this was retreated with the 3 mm ultra score balloon.  Attention was then returned to the SFA above-knee popliteal.  A 6 mm x 150 mm life stent is then deployed across the distal SFA and proximal popliteal.  Is postdilated with a 5 mm x 150 mm Lutonix drug-eluting balloon inflated to 10 atm for approximately 1 minute.  Distal runoff was then reassessed and noted to be widely patent with less than 10% residual stenosis within the SFA popliteal and less than 10% residual stenosis within the posterior tibial artery.    After review of these images the sheath is pulled into the left external iliac oblique of the common femoral is obtained and a Star close device deployed. There no immediate Complications.  Findings:  The abdominal aorta is opacified with a bolus injection contrast. Renal arteries are single and widely patent without evidence of hemodynamically significant stenosis.  The aorta itself has diffuse disease but no hemodynamically significant lesions. The common and external iliac arteries are widely patent bilaterally.  The right common femoral is widely patent as is the profunda femoris.  The SFA does indeed have a significant stenosis in its midportion and distal portion at Hunter's canal this lesion extends into the above-knee popliteal.  It shows multiple segments of greater than 80% stenosis with dense calcifications..  The distal popliteal demonstrates calcific disease but no hemodynamically significant stenoses.  The trifurcation is heavily diseased  with occlusion of the anterior tibial.  The peroneal is patent but rather small.  The posterior tibial demonstrates a greater than 80% stenosis proximally.  Distal to the stenosis the artery is widely patent and fills the plantar arteries and the pedal arch.    Following angioplasty right posterior tibial now is in-line flow and looks quite nice with less than 10% residual stenosis.  Following atherectomy of the SFA there is now recanalization but greater than 50% stenosis remains in multiple locations.  Angioplasty and stent placement of the mid right SFA extending through Hunter's canal yields an excellent result with less than 10% residual stenosis.  Summary: Successful recanalization right lower extremity for limb salvage                           Disposition: Patient was taken to the recovery room in stable condition having tolerated the procedure well.  Earl Lites Shaylie Eklund 07/12/2023,11:15 AM

## 2023-07-12 NOTE — Interval H&P Note (Signed)
History and Physical Interval Note:  07/12/2023 10:00 AM  Ivan Reyes  has presented today for surgery, with the diagnosis of RLE Angio   ASO w rest pain.  The various methods of treatment have been discussed with the patient and family. After consideration of risks, benefits and other options for treatment, the patient has consented to  Procedure(s): Lower Extremity Angiography (Right) as a surgical intervention.  The patient's history has been reviewed, patient examined, no change in status, stable for surgery.  I have reviewed the patient's chart and labs.  Questions were answered to the patient's satisfaction.     Levora Dredge

## 2023-07-12 NOTE — Progress Notes (Addendum)
Received a phone call from pt. Grandson, stating [facility] Phone stated "[employer]". I told pt. His grandson was coming around [facility]. Pt. Got out of wheelchair in front on [facility], & headed to his burgundy Nissan truck & drove away. Attempted to stop pt. With conversation & told him I'd have to call security. He stated "every time I come here I drive my self home." Pt. Left in his own vehicle. Saw Dr. Gilda Crease immediately after entering building & told him how pt. Left.

## 2023-08-02 ENCOUNTER — Encounter: Payer: Self-pay | Admitting: Urology

## 2023-08-03 ENCOUNTER — Other Ambulatory Visit (INDEPENDENT_AMBULATORY_CARE_PROVIDER_SITE_OTHER): Payer: Self-pay | Admitting: Vascular Surgery

## 2023-08-03 DIAGNOSIS — Z9889 Other specified postprocedural states: Secondary | ICD-10-CM

## 2023-08-04 ENCOUNTER — Ambulatory Visit (INDEPENDENT_AMBULATORY_CARE_PROVIDER_SITE_OTHER): Payer: PPO | Admitting: Nurse Practitioner

## 2023-08-04 ENCOUNTER — Encounter (INDEPENDENT_AMBULATORY_CARE_PROVIDER_SITE_OTHER): Payer: Self-pay | Admitting: Nurse Practitioner

## 2023-08-04 ENCOUNTER — Ambulatory Visit (INDEPENDENT_AMBULATORY_CARE_PROVIDER_SITE_OTHER): Payer: PPO

## 2023-08-04 ENCOUNTER — Ambulatory Visit (INDEPENDENT_AMBULATORY_CARE_PROVIDER_SITE_OTHER): Payer: PPO | Admitting: Vascular Surgery

## 2023-08-04 VITALS — BP 138/80 | HR 64 | Resp 18 | Ht 69.75 in | Wt 166.0 lb

## 2023-08-04 DIAGNOSIS — Z9889 Other specified postprocedural states: Secondary | ICD-10-CM | POA: Diagnosis not present

## 2023-08-04 DIAGNOSIS — E782 Mixed hyperlipidemia: Secondary | ICD-10-CM | POA: Diagnosis not present

## 2023-08-04 DIAGNOSIS — I10 Essential (primary) hypertension: Secondary | ICD-10-CM

## 2023-08-04 DIAGNOSIS — I739 Peripheral vascular disease, unspecified: Secondary | ICD-10-CM | POA: Diagnosis not present

## 2023-08-04 LAB — VAS US ABI WITH/WO TBI
Left ABI: 0.86
Right ABI: 1.17

## 2023-08-15 NOTE — Progress Notes (Signed)
Subjective:    Patient ID: Ivan Reyes, male    DOB: 12-Mar-1946, 77 y.o.   MRN: [id number] Chief Complaint  Patient presents with   Follow-up     3 weeks (08/02/2023); follow up after procedure with ABI    The patient returns to the office for followup and review status post angiogram with intervention on 07/12/2023.   Procedure: Procedure(s) Performed:             1.  Introduction catheter into right lower extremity 3rd order catheter placement               2.    Contrast injection right lower extremity for distal runoff             3.  Percutaneous transluminal angioplasty and stent placement right superficial femoral and popliteal arteries to 5 mm             4.  Rota Rex atherectomy right SFA and above-knee popliteal.               5.  Percutaneous transluminal angioplasty right posterior tibial artery to 4 mm             6.  Star close closure left common femoral arteriotomy   The patient notes improvement in the lower extremity symptoms. No interval shortening of the patient's claudication distance or rest pain symptoms. No new ulcers or wounds have occurred since the last visit.  There have been no significant changes to the patient's overall health care.  No documented history of amaurosis fugax or recent TIA symptoms. There are no recent neurological changes noted. No documented history of DVT, PE or superficial thrombophlebitis. The patient denies recent episodes of angina or shortness of breath.   ABI's Rt=1.17 and Lt=0.86  (previous ABI's Rt=0.88 and Lt=0.67) Duplex US of the right lower extremity shows biphasic waveforms with monophasic tibial waveforms in the left.  Normal toe waveforms bilaterally    Review of Systems  All other systems reviewed and are negative.      Objective:   Physical Exam Vitals reviewed.  HENT:     Head: Normocephalic.  Cardiovascular:     Rate and Rhythm: Normal rate.     Pulses:          Dorsalis pedis pulses are 1+ on  the right side and detected w/ Doppler on the left side.       Posterior tibial pulses are 1+ on the right side and detected w/ Doppler on the left side.  Pulmonary:     Effort: Pulmonary effort is normal.  Skin:    General: Skin is warm and dry.  Neurological:     Mental Status: He is alert and oriented to person, place, and time.  Psychiatric:        Mood and Affect: Mood normal.        Behavior: Behavior normal.        Thought Content: Thought content normal.        Judgment: Judgment normal.     BP 138/80   Pulse 64   Resp 18   Ht 5' 9.75" (1.772 m)   Wt 166 lb (75.3 kg)   BMI 23.99 kg/m   Past Medical History:  Diagnosis Date   Arthritis    Coronary artery disease    GERD (gastroesophageal reflux disease)    Hyperlipidemia    Hypertension    Mitral valve disorder    Peripheral vascular disease (HCC)  Sleep apnea    use C-PAP    Social History   Socioeconomic History   Marital status: Widowed    Spouse name: Not on file   Number of children: 2   Years of education: Not on file   Highest education level: Not on file  Occupational History   Not on file  Tobacco Use   Smoking status: Every Day    Current packs/day: 0.50    Average packs/day: 0.5 packs/day for 60.0 years (30.0 ttl pk-yrs)    Types: Cigarettes   Smokeless tobacco: Never   Tobacco comments:    since age 48  Vaping Use   Vaping status: Never Used  Substance and Sexual Activity   Alcohol use: Yes    Alcohol/week: 8.0 standard drinks of alcohol    Types: 6 Cans of beer, 2 Standard drinks or equivalent per week    Comment: 4/ week   Drug use: No   Sexual activity: Not on file  Other Topics Concern   Not on file  Social History Narrative   Lives with grandson Debby Freiberg 83 y.o.    Social Drivers of Corporate investment banker Strain: Not on file  Food Insecurity: No Food Insecurity (06/11/2022)   Hunger Vital Sign    Worried About Running Out of Food in the Last Year: Never true     Ran Out of Food in the Last Year: Never true  Transportation Needs: No Transportation Needs (06/11/2022)   PRAPARE - Administrator, Civil Service (Medical): No    Lack of Transportation (Non-Medical): No  Physical Activity: Not on file  Stress: Not on file  Social Connections: Not on file  Intimate Partner Violence: Not At Risk (06/11/2022)   Humiliation, Afraid, Rape, and Kick questionnaire    Fear of Current or Ex-Partner: No    Emotionally Abused: No    Physically Abused: No    Sexually Abused: No    Past Surgical History:  Procedure Laterality Date   BROW LIFT Bilateral 07/24/2020   Procedure: BLEPHAROPLASTY UPPER EYELID; W/EXCESS SKIN BILATERAL;  Surgeon: Imagene Riches, MD;  Location: [facility];  Service: Ophthalmology;  Laterality: Bilateral;  sleep apnea   CAROTID ENDARTERECTOMY Bilateral    Dr. Gilda Crease, [facility]   CARPAL TUNNEL RELEASE Left 12/23/2016   Procedure: CARPAL TUNNEL RELEASE ENDOSCOPIC;  Surgeon: Christena Flake, MD;  Location: [facility];  Service: Orthopedics;  Laterality: Left;   CARPAL TUNNEL RELEASE Right 01/13/2017   Procedure: CARPAL TUNNEL RELEASE ENDOSCOPIC;  Surgeon: Christena Flake, MD;  Location: [facility];  Service: Orthopedics;  Laterality: Right;   COLONOSCOPY WITH PROPOFOL N/A 09/04/2018   Procedure: COLONOSCOPY WITH PROPOFOL;  Surgeon: Scot Jun, MD;  Location: [facility];  Service: Endoscopy;  Laterality: N/A;   CORONARY ANGIOPLASTY     3 stents   coronary atherosclerosis of  autologous graft     ESOPHAGOGASTRODUODENOSCOPY (EGD) WITH PROPOFOL N/A 12/12/2017   Procedure: ESOPHAGOGASTRODUODENOSCOPY (EGD) WITH PROPOFOL;  Surgeon: Scot Jun, MD;  Location: [facility];  Service: Endoscopy;  Laterality: N/A;   ESOPHAGOGASTRODUODENOSCOPY (EGD) WITH PROPOFOL N/A 09/04/2018   Procedure: ESOPHAGOGASTRODUODENOSCOPY (EGD) WITH PROPOFOL;  Surgeon: Scot Jun, MD;  Location: [facility];  Service: Endoscopy;  Laterality:  N/A;   EYE SURGERY Bilateral    Cataract Extraction with IOL   FRACTURE SURGERY Right    Hip Pinning, Dr. Neva Seat, Jr   JOINT REPLACEMENT     left knee  KNEE ARTHROPLASTY Left 06/30/2016   Procedure: COMPUTER ASSISTED TOTAL KNEE ARTHROPLASTY;  Surgeon: Donato Heinz, MD;  Location: [facility];  Service: Orthopedics;  Laterality: Left;   LOWER EXTREMITY ANGIOGRAPHY Left 05/06/2020   Procedure: LOWER EXTREMITY ANGIOGRAPHY;  Surgeon: Renford Dills, MD;  Location: [facility];  Service: Cardiovascular;  Laterality: Left;   LOWER EXTREMITY ANGIOGRAPHY Right 07/12/2023   Procedure: Lower Extremity Angiography;  Surgeon: Renford Dills, MD;  Location: [facility];  Service: Cardiovascular;  Laterality: Right;   MASS EXCISION Left 01/11/2018   Procedure: EXCISION CYSTIC MASS ANTERIOR KNEE;  Surgeon: Donato Heinz, MD;  Location: [facility];  Service: Orthopedics;  Laterality: Left;    History reviewed. No pertinent family history.  No Known Allergies     Latest Ref Rng & Units 06/30/2023   12:47 PM 12/20/2022    1:01 PM 09/21/2022    1:41 PM  CBC  WBC 4.0 - 10.5 K/uL 7.6  8.2  8.0   Hemoglobin 13.0 - 17.0 g/dL 53.6  64.4  03.4   Hematocrit 39.0 - 52.0 % 41.0  40.0  39.6   Platelets 150 - 400 K/uL 195  216  246       CMP     Component Value Date/Time   NA 140 06/11/2022 1401   K 3.9 06/11/2022 1401   K 4.1 06/28/2014 1603   CL 109 06/11/2022 1401   CO2 22 06/11/2022 1401   GLUCOSE 127 (H) 06/11/2022 1401   BUN 19 07/12/2023 0856   CREATININE 1.08 07/12/2023 0856   CALCIUM 9.5 06/11/2022 1401   PROT 7.1 06/11/2022 1401   ALBUMIN 3.8 06/11/2022 1401   AST 29 06/11/2022 1401   ALT 15 06/11/2022 1401   ALKPHOS 47 06/11/2022 1401   BILITOT 0.5 06/11/2022 1401   GFRNONAA >60 07/12/2023 0856     VAS [place] ABI WITH/WO TBI Result Date: 08/04/2023  LOWER EXTREMITY DOPPLER STUDY Patient Name:  Ivan Reyes  Date of Exam:   08/04/2023 Medical Rec  #: [id number]           Accession #:    [id number] Date of Birth: 01-26-1946          Patient Gender: M Patient Age:   36 years Exam Location:  [facility] Procedure:      VAS [place] ABI WITH/WO TBI Referring Phys: --------------------------------------------------------------------------------  Indications: Peripheral artery disease.  Vascular Interventions: Left SFA multiple stents                         07/12/2023 Percutaneous transluminal angioplasty and                         stent placement right superficial femoral and popliteal                         arteries to 5 mm                         4. Rota Rex atherectomy right SFA and above-knee                         popliteal.                         5. Percutaneous transluminal angioplasty right posterior  tibial artery to 4 mm. Comparison Study: 06/2023 Performing Technologist: Salvadore Farber RVT  Examination Guidelines: A complete evaluation includes at minimum, Doppler waveform signals and systolic blood pressure reading at the level of bilateral brachial, anterior tibial, and posterior tibial arteries, when vessel segments are accessible. Bilateral testing is considered an integral part of a complete examination. Photoelectric Plethysmograph (PPG) waveforms and toe systolic pressure readings are included as required and additional duplex testing as needed. Limited examinations for reoccurring indications may be performed as noted.  ABI Findings: +---------+------------------+-----+--------+--------+ Right    Rt Pressure (mmHg)IndexWaveformComment  +---------+------------------+-----+--------+--------+ Brachial 122                                     +---------+------------------+-----+--------+--------+ ATA      118               0.94 biphasic         +---------+------------------+-----+--------+--------+ PTA      146               1.17 biphasic          +---------+------------------+-----+--------+--------+ Great Toe113               0.90 Normal           +---------+------------------+-----+--------+--------+ +---------+------------------+-----+----------+-------+ Left     Lt Pressure (mmHg)IndexWaveform  Comment +---------+------------------+-----+----------+-------+ Brachial 125                                      +---------+------------------+-----+----------+-------+ ATA      107               0.86 monophasic        +---------+------------------+-----+----------+-------+ PTA      93                0.74 monophasic        +---------+------------------+-----+----------+-------+ Great Toe97                0.78 Normal            +---------+------------------+-----+----------+-------+ +-------+-----------+-----------+------------+------------+ ABI/TBIToday's ABIToday's TBIPrevious ABIPrevious TBI +-------+-----------+-----------+------------+------------+ Right  1.17       .90        .88         .57          +-------+-----------+-----------+------------+------------+ Left   .86        .78        .67         .59          +-------+-----------+-----------+------------+------------+ Right ABIs and TBIs appear increased compared to prior study on 06/2023. Left ABIs and TBIs appear increased compared to prior study on 06/2023.  Summary: Right: Resting right ankle-brachial index is within normal range. The right toe-brachial index is normal. Left: Resting left ankle-brachial index indicates mild left lower extremity arterial disease. The left toe-brachial index is normal. *See table(s) above for measurements and observations.  Electronically signed by Levora Dredge MD on 08/04/2023 at 4:16:50 PM.    Final    VAS [place] ABI WITH/WO TBI Result Date: 06/30/2023  LOWER EXTREMITY DOPPLER STUDY Patient Name:  Ivan Reyes  Date of Exam:   06/28/2023 Medical Rec #: [id number]           Accession #:    [id number] Date of  Birth: 05-08-1946  Patient Gender: M Patient Age:   48 years Exam Location:  [facility] Procedure:      VAS [place] ABI WITH/WO TBI Referring Phys: Levora Dredge --------------------------------------------------------------------------------  Indications: Peripheral artery disease.  Vascular Interventions: Left SFA multiple stents. Comparison Study: 11/23/2022 Performing Technologist: Debbe Bales RVS  Examination Guidelines: A complete evaluation includes at minimum, Doppler waveform signals and systolic blood pressure reading at the level of bilateral brachial, anterior tibial, and posterior tibial arteries, when vessel segments are accessible. Bilateral testing is considered an integral part of a complete examination. Photoelectric Plethysmograph (PPG) waveforms and toe systolic pressure readings are included as required and additional duplex testing as needed. Limited examinations for reoccurring indications may be performed as noted.  ABI Findings: +---------+------------------+-----+----------+--------+ Right    Rt Pressure (mmHg)IndexWaveform  Comment  +---------+------------------+-----+----------+--------+ Brachial 126                                       +---------+------------------+-----+----------+--------+ ATA      79                0.63 monophasic         +---------+------------------+-----+----------+--------+ PTA      111               0.88 monophasic         +---------+------------------+-----+----------+--------+ PERO     91                0.72 monophasic         +---------+------------------+-----+----------+--------+ Great Toe72                0.57 Abnormal           +---------+------------------+-----+----------+--------+ +---------+------------------+-----+----------+-------+ Left     Lt Pressure (mmHg)IndexWaveform  Comment +---------+------------------+-----+----------+-------+ Brachial 126                                       +---------+------------------+-----+----------+-------+ ATA      41                0.33 monophasic        +---------+------------------+-----+----------+-------+ PTA      84                0.67 monophasic        +---------+------------------+-----+----------+-------+ PERO     80                0.63 monophasic        +---------+------------------+-----+----------+-------+ Great Toe74                0.59 Abnormal          +---------+------------------+-----+----------+-------+ +-------+-----------+-----------+------------+------------+ ABI/TBIToday's ABIToday's TBIPrevious ABIPrevious TBI +-------+-----------+-----------+------------+------------+ Right  .88        .57        1.21        .76          +-------+-----------+-----------+------------+------------+ Left   .67        .57        .76         .79          +-------+-----------+-----------+------------+------------+  Bilateral TBIs appear decreased compared to prior study on 11/23/2022. Bilateral ABIs appear decreased compared to prior study on 11/23/2022.  Summary: Right: Resting right ankle-brachial index indicates mild right  lower extremity arterial disease. The right toe-brachial index is abnormal. Left: Resting left ankle-brachial index indicates moderate left lower extremity arterial disease. The left toe-brachial index is abnormal. *See table(s) above for measurements and observations.  Electronically signed by Levora Dredge MD on 06/30/2023 at 3:34:26 PM.    Final        Assessment & Plan:   1. Peripheral arterial disease with history of revascularization (HCC) (Primary) Recommend:  The patient is status post successful angiogram with intervention.  The patient reports that the claudication symptoms and leg pain has improved.   The patient denies lifestyle limiting changes at this point in time.  No further invasive studies, angiography or surgery at this time. The patient should continue  walking and begin a more formal exercise program.  The patient should continue antiplatelet therapy and aggressive treatment of the lipid abnormalities  Continued surveillance is indicated as atherosclerosis is likely to progress with time.    Patient should undergo noninvasive studies as ordered. The patient will follow up with me to review the studies.   2. Primary hypertension Continue antihypertensive medications as already ordered, these medications have been reviewed and there are no changes at this time.  3. Mixed hyperlipidemia Continue statin as ordered and reviewed, no changes at this time   Current Outpatient Medications on File Prior to Visit  Medication Sig Dispense Refill   Apoaequorin (PREVAGEN) 10 MG CAPS Take 10 mg by mouth daily.     Cholecalciferol (VITAMIN D) 2000 units CAPS Take 2,000 Units by mouth every evening.     clopidogrel (PLAVIX) 75 MG tablet Take 75 mg by mouth daily.     enalapril (VASOTEC) 20 MG tablet Take 20 mg by mouth 2 (two) times daily.     fenofibrate micronized (LOFIBRA) 134 MG capsule Take 134 mg by mouth daily before breakfast.      finasteride (PROSCAR) 5 MG tablet Take 5 mg by mouth daily.     Glucosamine HCl (GLUCOSAMINE PO) Take 1 tablet by mouth 2 (two) times daily.     pantoprazole (PROTONIX) 20 MG tablet Take 20 mg by mouth every morning.     sildenafil (REVATIO) 20 MG tablet Take 20-40 mg by mouth daily as needed. Pt states 2 morning and night     simvastatin (ZOCOR) 40 MG tablet Take 40 mg by mouth daily at 6 PM.      tadalafil (CIALIS) 20 MG tablet Take 20 mg by mouth daily as needed.     tamsulosin (FLOMAX) 0.4 MG CAPS capsule Take 1 capsule (0.4 mg total) by mouth daily. 90 capsule 3   Multiple Vitamins-Minerals (PX COMPLETE SENIOR MULTIVITS) TABS Take 1 tablet by mouth. (Patient not taking: Reported on 06/28/2023)     Omega-3 Fatty Acids (OMEGA-3 FISH OIL) 300 MG CAPS Take 300 mg by mouth 2 (two) times daily. (Patient not taking:  Reported on 06/28/2023)     vitamin B-12 (CYANOCOBALAMIN) 1000 MCG tablet Take 1,000 mcg by mouth daily. (Patient not taking: Reported on 06/28/2023)     vitamin E 400 UNIT capsule Take 400 Units by mouth every evening.  (Patient not taking: Reported on 06/28/2023)     No current facility-administered medications on file prior to visit.    There are no Patient Instructions on file for this visit. No follow-ups on file.   Georgiana Spinner, NP

## 2023-09-02 DIAGNOSIS — D692 Other nonthrombocytopenic purpura: Secondary | ICD-10-CM | POA: Diagnosis not present

## 2023-09-02 DIAGNOSIS — L821 Other seborrheic keratosis: Secondary | ICD-10-CM | POA: Diagnosis not present

## 2023-09-02 DIAGNOSIS — D485 Neoplasm of uncertain behavior of skin: Secondary | ICD-10-CM | POA: Diagnosis not present

## 2023-09-02 DIAGNOSIS — L720 Epidermal cyst: Secondary | ICD-10-CM | POA: Diagnosis not present

## 2023-10-23 ENCOUNTER — Other Ambulatory Visit: Payer: Self-pay | Admitting: Urology

## 2023-10-23 DIAGNOSIS — N138 Other obstructive and reflux uropathy: Secondary | ICD-10-CM

## 2023-10-26 ENCOUNTER — Ambulatory Visit: Payer: PPO | Admitting: Urology

## 2023-10-27 ENCOUNTER — Ambulatory Visit: Payer: Self-pay | Admitting: Urology

## 2023-11-01 ENCOUNTER — Other Ambulatory Visit (INDEPENDENT_AMBULATORY_CARE_PROVIDER_SITE_OTHER): Payer: Self-pay | Admitting: Nurse Practitioner

## 2023-11-01 DIAGNOSIS — Z9889 Other specified postprocedural states: Secondary | ICD-10-CM

## 2023-11-02 ENCOUNTER — Encounter (INDEPENDENT_AMBULATORY_CARE_PROVIDER_SITE_OTHER): Payer: PPO

## 2023-11-02 ENCOUNTER — Ambulatory Visit (INDEPENDENT_AMBULATORY_CARE_PROVIDER_SITE_OTHER): Payer: PPO | Admitting: Nurse Practitioner

## 2023-11-15 DIAGNOSIS — I10 Essential (primary) hypertension: Secondary | ICD-10-CM | POA: Diagnosis not present

## 2023-11-22 DIAGNOSIS — I2510 Atherosclerotic heart disease of native coronary artery without angina pectoris: Secondary | ICD-10-CM | POA: Diagnosis not present

## 2023-11-22 DIAGNOSIS — Z720 Tobacco use: Secondary | ICD-10-CM | POA: Diagnosis not present

## 2023-11-22 DIAGNOSIS — D509 Iron deficiency anemia, unspecified: Secondary | ICD-10-CM | POA: Diagnosis not present

## 2023-11-22 DIAGNOSIS — E782 Mixed hyperlipidemia: Secondary | ICD-10-CM | POA: Diagnosis not present

## 2023-11-22 DIAGNOSIS — K224 Dyskinesia of esophagus: Secondary | ICD-10-CM | POA: Diagnosis not present

## 2023-11-22 DIAGNOSIS — Z125 Encounter for screening for malignant neoplasm of prostate: Secondary | ICD-10-CM | POA: Diagnosis not present

## 2023-11-22 DIAGNOSIS — I10 Essential (primary) hypertension: Secondary | ICD-10-CM | POA: Diagnosis not present

## 2023-11-22 DIAGNOSIS — N529 Male erectile dysfunction, unspecified: Secondary | ICD-10-CM | POA: Diagnosis not present

## 2023-11-22 DIAGNOSIS — G4733 Obstructive sleep apnea (adult) (pediatric): Secondary | ICD-10-CM | POA: Diagnosis not present

## 2024-01-03 ENCOUNTER — Encounter (INDEPENDENT_AMBULATORY_CARE_PROVIDER_SITE_OTHER): Payer: Self-pay

## 2024-01-23 DIAGNOSIS — Z03818 Encounter for observation for suspected exposure to other biological agents ruled out: Secondary | ICD-10-CM | POA: Diagnosis not present

## 2024-01-23 DIAGNOSIS — J188 Other pneumonia, unspecified organism: Secondary | ICD-10-CM | POA: Diagnosis not present

## 2024-01-23 DIAGNOSIS — R058 Other specified cough: Secondary | ICD-10-CM | POA: Diagnosis not present

## 2024-01-24 ENCOUNTER — Other Ambulatory Visit: Payer: Self-pay | Admitting: Urology

## 2024-01-24 DIAGNOSIS — N138 Other obstructive and reflux uropathy: Secondary | ICD-10-CM

## 2024-01-31 DIAGNOSIS — Z0000 Encounter for general adult medical examination without abnormal findings: Secondary | ICD-10-CM | POA: Diagnosis not present

## 2024-01-31 DIAGNOSIS — I70219 Atherosclerosis of native arteries of extremities with intermittent claudication, unspecified extremity: Secondary | ICD-10-CM | POA: Diagnosis not present

## 2024-01-31 DIAGNOSIS — K224 Dyskinesia of esophagus: Secondary | ICD-10-CM | POA: Diagnosis not present

## 2024-01-31 DIAGNOSIS — G4733 Obstructive sleep apnea (adult) (pediatric): Secondary | ICD-10-CM | POA: Diagnosis not present

## 2024-01-31 DIAGNOSIS — J189 Pneumonia, unspecified organism: Secondary | ICD-10-CM | POA: Diagnosis not present

## 2024-01-31 DIAGNOSIS — I2510 Atherosclerotic heart disease of native coronary artery without angina pectoris: Secondary | ICD-10-CM | POA: Diagnosis not present

## 2024-01-31 DIAGNOSIS — E782 Mixed hyperlipidemia: Secondary | ICD-10-CM | POA: Diagnosis not present

## 2024-01-31 DIAGNOSIS — Z720 Tobacco use: Secondary | ICD-10-CM | POA: Diagnosis not present

## 2024-01-31 DIAGNOSIS — Z125 Encounter for screening for malignant neoplasm of prostate: Secondary | ICD-10-CM | POA: Diagnosis not present

## 2024-01-31 DIAGNOSIS — I10 Essential (primary) hypertension: Secondary | ICD-10-CM | POA: Diagnosis not present

## 2024-01-31 DIAGNOSIS — D509 Iron deficiency anemia, unspecified: Secondary | ICD-10-CM | POA: Diagnosis not present

## 2024-01-31 DIAGNOSIS — Z1331 Encounter for screening for depression: Secondary | ICD-10-CM | POA: Diagnosis not present

## 2024-02-09 ENCOUNTER — Encounter: Payer: Self-pay | Admitting: *Deleted

## 2024-02-09 ENCOUNTER — Emergency Department
Admission: EM | Admit: 2024-02-09 | Discharge: 2024-02-09 | Disposition: A | Discharge status: Against Medical Advice | Attending: Emergency Medicine | Admitting: Emergency Medicine

## 2024-02-09 ENCOUNTER — Emergency Department

## 2024-02-09 ENCOUNTER — Other Ambulatory Visit: Payer: Self-pay

## 2024-02-09 DIAGNOSIS — R0602 Shortness of breath: Secondary | ICD-10-CM | POA: Insufficient documentation

## 2024-02-09 DIAGNOSIS — Z5321 Procedure and treatment not carried out due to patient leaving prior to being seen by health care provider: Secondary | ICD-10-CM | POA: Diagnosis not present

## 2024-02-09 DIAGNOSIS — R918 Other nonspecific abnormal finding of lung field: Secondary | ICD-10-CM | POA: Diagnosis not present

## 2024-02-09 LAB — CBC
HCT: 37.1 % — ABNORMAL LOW (ref 39.0–52.0)
Hemoglobin: 12.6 g/dL — ABNORMAL LOW (ref 13.0–17.0)
MCH: 29.9 pg (ref 26.0–34.0)
MCHC: 34 g/dL (ref 30.0–36.0)
MCV: 88.1 fL (ref 80.0–100.0)
Platelets: 383 10*3/uL (ref 150–400)
RBC: 4.21 MIL/uL — ABNORMAL LOW (ref 4.22–5.81)
RDW: 13.1 % (ref 11.5–15.5)
WBC: 10.2 10*3/uL (ref 4.0–10.5)
nRBC: 0 % (ref 0.0–0.2)

## 2024-02-09 LAB — RESP PANEL BY RT-PCR (RSV, FLU A&B, COVID)  RVPGX2
Influenza A by PCR: NEGATIVE
Influenza B by PCR: NEGATIVE
Resp Syncytial Virus by PCR: NEGATIVE
SARS Coronavirus 2 by RT PCR: NEGATIVE

## 2024-02-09 LAB — BASIC METABOLIC PANEL WITH GFR
Anion gap: 10 (ref 5–15)
BUN: 14 mg/dL (ref 8–23)
CO2: 25 mmol/L (ref 22–32)
Calcium: 9.7 mg/dL (ref 8.9–10.3)
Chloride: 101 mmol/L (ref 98–111)
Creatinine, Ser: 0.77 mg/dL (ref 0.61–1.24)
GFR, Estimated: >60 mL/min (ref >60)
Glucose, Bld: 90 mg/dL (ref 70–99)
Potassium: 4.2 mmol/L (ref 3.5–5.1)
Sodium: 136 mmol/L (ref 135–145)

## 2024-02-09 LAB — TROPONIN I (HIGH SENSITIVITY): Troponin I (High Sensitivity): 9 ng/L (ref <18)

## 2024-02-09 LAB — PROTIME-INR
INR: 1.1 (ref 0.8–1.2)
Prothrombin Time: 14.7 s (ref 11.4–15.2)

## 2024-02-09 NOTE — ED Notes (Signed)
 No call when answered from lobby x3

## 2024-02-09 NOTE — ED Triage Notes (Signed)
 Pt ambulatory to triage   pt reports sob.  Pt requesting albuterol   sx for 3 weeks.  Pt has a cough.  No chest pain.  No fevers.  Recent pneumonia per pt.  Pt alert  speech clear.

## 2024-02-09 NOTE — ED Triage Notes (Signed)
 Pt to ED via POV from [facility]. Pt dx with RLL PNA and reports continued SOB w/ exertion. Pt sent due to outpt antibiotic failure.   162/68 HR 80 97% RA

## 2024-02-13 DIAGNOSIS — R918 Other nonspecific abnormal finding of lung field: Secondary | ICD-10-CM | POA: Diagnosis not present

## 2024-02-13 DIAGNOSIS — J90 Pleural effusion, not elsewhere classified: Secondary | ICD-10-CM | POA: Diagnosis not present

## 2024-02-13 DIAGNOSIS — R350 Frequency of micturition: Secondary | ICD-10-CM | POA: Diagnosis not present

## 2024-02-13 DIAGNOSIS — C7951 Secondary malignant neoplasm of bone: Secondary | ICD-10-CM | POA: Diagnosis not present

## 2024-02-13 DIAGNOSIS — Z1152 Encounter for screening for COVID-19: Secondary | ICD-10-CM | POA: Diagnosis not present

## 2024-02-13 DIAGNOSIS — I272 Pulmonary hypertension, unspecified: Secondary | ICD-10-CM | POA: Diagnosis not present

## 2024-02-13 DIAGNOSIS — I2510 Atherosclerotic heart disease of native coronary artery without angina pectoris: Secondary | ICD-10-CM | POA: Diagnosis not present

## 2024-02-13 DIAGNOSIS — C3490 Malignant neoplasm of unspecified part of unspecified bronchus or lung: Secondary | ICD-10-CM | POA: Diagnosis not present

## 2024-02-13 DIAGNOSIS — R630 Anorexia: Secondary | ICD-10-CM | POA: Diagnosis not present

## 2024-02-13 DIAGNOSIS — C342 Malignant neoplasm of middle lobe, bronchus or lung: Secondary | ICD-10-CM | POA: Diagnosis not present

## 2024-02-13 DIAGNOSIS — G4733 Obstructive sleep apnea (adult) (pediatric): Secondary | ICD-10-CM | POA: Diagnosis not present

## 2024-02-13 DIAGNOSIS — C787 Secondary malignant neoplasm of liver and intrahepatic bile duct: Secondary | ICD-10-CM | POA: Diagnosis not present

## 2024-02-13 DIAGNOSIS — R590 Localized enlarged lymph nodes: Secondary | ICD-10-CM | POA: Diagnosis not present

## 2024-02-13 DIAGNOSIS — J810 Acute pulmonary edema: Secondary | ICD-10-CM | POA: Diagnosis not present

## 2024-02-13 DIAGNOSIS — I10 Essential (primary) hypertension: Secondary | ICD-10-CM | POA: Diagnosis not present

## 2024-02-13 DIAGNOSIS — G939 Disorder of brain, unspecified: Secondary | ICD-10-CM | POA: Diagnosis not present

## 2024-02-13 DIAGNOSIS — J432 Centrilobular emphysema: Secondary | ICD-10-CM | POA: Diagnosis not present

## 2024-02-13 DIAGNOSIS — Z955 Presence of coronary angioplasty implant and graft: Secondary | ICD-10-CM | POA: Diagnosis not present

## 2024-02-13 DIAGNOSIS — Z7902 Long term (current) use of antithrombotics/antiplatelets: Secondary | ICD-10-CM | POA: Diagnosis not present

## 2024-02-13 DIAGNOSIS — C771 Secondary and unspecified malignant neoplasm of intrathoracic lymph nodes: Secondary | ICD-10-CM | POA: Diagnosis not present

## 2024-02-13 DIAGNOSIS — R0609 Other forms of dyspnea: Secondary | ICD-10-CM | POA: Diagnosis not present

## 2024-02-13 DIAGNOSIS — R432 Parageusia: Secondary | ICD-10-CM | POA: Diagnosis not present

## 2024-02-13 DIAGNOSIS — D649 Anemia, unspecified: Secondary | ICD-10-CM | POA: Diagnosis not present

## 2024-02-13 DIAGNOSIS — K229 Disease of esophagus, unspecified: Secondary | ICD-10-CM | POA: Diagnosis not present

## 2024-02-13 DIAGNOSIS — Z87891 Personal history of nicotine dependence: Secondary | ICD-10-CM | POA: Diagnosis not present

## 2024-02-13 DIAGNOSIS — E782 Mixed hyperlipidemia: Secondary | ICD-10-CM | POA: Diagnosis not present

## 2024-02-23 DIAGNOSIS — I10 Essential (primary) hypertension: Secondary | ICD-10-CM | POA: Diagnosis not present

## 2024-02-23 DIAGNOSIS — C801 Malignant (primary) neoplasm, unspecified: Secondary | ICD-10-CM | POA: Diagnosis not present

## 2024-02-23 DIAGNOSIS — C3401 Malignant neoplasm of right main bronchus: Secondary | ICD-10-CM | POA: Diagnosis not present

## 2024-02-23 DIAGNOSIS — R918 Other nonspecific abnormal finding of lung field: Secondary | ICD-10-CM | POA: Diagnosis not present

## 2024-02-23 DIAGNOSIS — C770 Secondary and unspecified malignant neoplasm of lymph nodes of head, face and neck: Secondary | ICD-10-CM | POA: Diagnosis not present

## 2024-02-23 DIAGNOSIS — E785 Hyperlipidemia, unspecified: Secondary | ICD-10-CM | POA: Diagnosis not present

## 2024-02-23 DIAGNOSIS — I2510 Atherosclerotic heart disease of native coronary artery without angina pectoris: Secondary | ICD-10-CM | POA: Diagnosis not present

## 2024-02-27 DIAGNOSIS — C3481 Malignant neoplasm of overlapping sites of right bronchus and lung: Secondary | ICD-10-CM | POA: Diagnosis not present

## 2024-02-27 DIAGNOSIS — R918 Other nonspecific abnormal finding of lung field: Secondary | ICD-10-CM | POA: Diagnosis not present

## 2024-02-27 DIAGNOSIS — C342 Malignant neoplasm of middle lobe, bronchus or lung: Secondary | ICD-10-CM | POA: Diagnosis not present

## 2024-02-27 DIAGNOSIS — R062 Wheezing: Secondary | ICD-10-CM | POA: Diagnosis not present

## 2024-03-05 DIAGNOSIS — N3289 Other specified disorders of bladder: Secondary | ICD-10-CM | POA: Diagnosis not present

## 2024-03-05 DIAGNOSIS — S0993XA Unspecified injury of face, initial encounter: Secondary | ICD-10-CM | POA: Diagnosis not present

## 2024-03-05 DIAGNOSIS — J439 Emphysema, unspecified: Secondary | ICD-10-CM | POA: Diagnosis not present

## 2024-03-05 DIAGNOSIS — R4182 Altered mental status, unspecified: Secondary | ICD-10-CM | POA: Diagnosis not present

## 2024-03-05 DIAGNOSIS — E8729 Other acidosis: Secondary | ICD-10-CM | POA: Diagnosis not present

## 2024-03-05 DIAGNOSIS — I469 Cardiac arrest, cause unspecified: Secondary | ICD-10-CM | POA: Diagnosis not present

## 2024-03-05 DIAGNOSIS — C761 Malignant neoplasm of thorax: Secondary | ICD-10-CM | POA: Diagnosis not present

## 2024-03-05 DIAGNOSIS — I10 Essential (primary) hypertension: Secondary | ICD-10-CM | POA: Diagnosis not present

## 2024-03-05 DIAGNOSIS — J9811 Atelectasis: Secondary | ICD-10-CM | POA: Diagnosis not present

## 2024-03-05 DIAGNOSIS — Z4682 Encounter for fitting and adjustment of non-vascular catheter: Secondary | ICD-10-CM | POA: Diagnosis not present

## 2024-03-05 DIAGNOSIS — N289 Disorder of kidney and ureter, unspecified: Secondary | ICD-10-CM | POA: Diagnosis not present

## 2024-03-05 DIAGNOSIS — R188 Other ascites: Secondary | ICD-10-CM | POA: Diagnosis not present

## 2024-03-05 DIAGNOSIS — N179 Acute kidney failure, unspecified: Secondary | ICD-10-CM | POA: Diagnosis not present

## 2024-03-05 DIAGNOSIS — R0600 Dyspnea, unspecified: Secondary | ICD-10-CM | POA: Diagnosis not present

## 2024-03-05 DIAGNOSIS — J984 Other disorders of lung: Secondary | ICD-10-CM | POA: Diagnosis not present

## 2024-03-05 DIAGNOSIS — R591 Generalized enlarged lymph nodes: Secondary | ICD-10-CM | POA: Diagnosis not present

## 2024-03-05 DIAGNOSIS — K5900 Constipation, unspecified: Secondary | ICD-10-CM | POA: Diagnosis not present

## 2024-03-05 DIAGNOSIS — C787 Secondary malignant neoplasm of liver and intrahepatic bile duct: Secondary | ICD-10-CM | POA: Diagnosis not present

## 2024-03-05 DIAGNOSIS — C770 Secondary and unspecified malignant neoplasm of lymph nodes of head, face and neck: Secondary | ICD-10-CM | POA: Diagnosis not present

## 2024-03-05 DIAGNOSIS — I472 Ventricular tachycardia, unspecified: Secondary | ICD-10-CM | POA: Diagnosis not present

## 2024-03-05 DIAGNOSIS — A419 Sepsis, unspecified organism: Secondary | ICD-10-CM | POA: Diagnosis not present

## 2024-03-05 DIAGNOSIS — C801 Malignant (primary) neoplasm, unspecified: Secondary | ICD-10-CM | POA: Diagnosis not present

## 2024-03-05 DIAGNOSIS — J181 Lobar pneumonia, unspecified organism: Secondary | ICD-10-CM | POA: Diagnosis not present

## 2024-03-05 DIAGNOSIS — R109 Unspecified abdominal pain: Secondary | ICD-10-CM | POA: Diagnosis not present

## 2024-03-05 DIAGNOSIS — C7931 Secondary malignant neoplasm of brain: Secondary | ICD-10-CM | POA: Diagnosis not present

## 2024-03-05 DIAGNOSIS — K828 Other specified diseases of gallbladder: Secondary | ICD-10-CM | POA: Diagnosis not present

## 2024-03-05 DIAGNOSIS — J9691 Respiratory failure, unspecified with hypoxia: Secondary | ICD-10-CM | POA: Diagnosis not present

## 2024-03-05 DIAGNOSIS — E872 Acidosis, unspecified: Secondary | ICD-10-CM | POA: Diagnosis not present

## 2024-03-05 DIAGNOSIS — C7951 Secondary malignant neoplasm of bone: Secondary | ICD-10-CM | POA: Diagnosis not present

## 2024-03-05 DIAGNOSIS — G928 Other toxic encephalopathy: Secondary | ICD-10-CM | POA: Diagnosis not present

## 2024-03-05 DIAGNOSIS — R0602 Shortness of breath: Secondary | ICD-10-CM | POA: Diagnosis not present

## 2024-03-05 DIAGNOSIS — Z1152 Encounter for screening for COVID-19: Secondary | ICD-10-CM | POA: Diagnosis not present

## 2024-03-05 DIAGNOSIS — I739 Peripheral vascular disease, unspecified: Secondary | ICD-10-CM | POA: Diagnosis not present

## 2024-03-05 DIAGNOSIS — F17210 Nicotine dependence, cigarettes, uncomplicated: Secondary | ICD-10-CM | POA: Diagnosis not present

## 2024-03-05 DIAGNOSIS — C7989 Secondary malignant neoplasm of other specified sites: Secondary | ICD-10-CM | POA: Diagnosis not present

## 2024-03-05 DIAGNOSIS — C3491 Malignant neoplasm of unspecified part of right bronchus or lung: Secondary | ICD-10-CM | POA: Diagnosis not present

## 2024-03-05 DIAGNOSIS — C3401 Malignant neoplasm of right main bronchus: Secondary | ICD-10-CM | POA: Diagnosis not present

## 2024-03-05 DIAGNOSIS — J432 Centrilobular emphysema: Secondary | ICD-10-CM | POA: Diagnosis not present

## 2024-03-05 DIAGNOSIS — R918 Other nonspecific abnormal finding of lung field: Secondary | ICD-10-CM | POA: Diagnosis not present

## 2024-03-05 DIAGNOSIS — I2510 Atherosclerotic heart disease of native coronary artery without angina pectoris: Secondary | ICD-10-CM | POA: Diagnosis not present

## 2024-03-05 DIAGNOSIS — E785 Hyperlipidemia, unspecified: Secondary | ICD-10-CM | POA: Diagnosis not present

## 2024-03-05 DIAGNOSIS — R579 Shock, unspecified: Secondary | ICD-10-CM | POA: Diagnosis not present

## 2024-03-05 DIAGNOSIS — J9601 Acute respiratory failure with hypoxia: Secondary | ICD-10-CM | POA: Diagnosis not present

## 2024-03-05 DIAGNOSIS — C342 Malignant neoplasm of middle lobe, bronchus or lung: Secondary | ICD-10-CM | POA: Diagnosis not present

## 2024-03-05 DIAGNOSIS — R0902 Hypoxemia: Secondary | ICD-10-CM | POA: Diagnosis not present

## 2024-03-05 DIAGNOSIS — Z9911 Dependence on respirator [ventilator] status: Secondary | ICD-10-CM | POA: Diagnosis not present

## 2024-03-05 DIAGNOSIS — J189 Pneumonia, unspecified organism: Secondary | ICD-10-CM | POA: Diagnosis not present

## 2024-03-05 DIAGNOSIS — J90 Pleural effusion, not elsewhere classified: Secondary | ICD-10-CM | POA: Diagnosis not present

## 2024-03-06 DIAGNOSIS — C801 Malignant (primary) neoplasm, unspecified: Secondary | ICD-10-CM | POA: Diagnosis not present

## 2024-03-06 DIAGNOSIS — C770 Secondary and unspecified malignant neoplasm of lymph nodes of head, face and neck: Secondary | ICD-10-CM | POA: Diagnosis not present

## 2024-03-07 DIAGNOSIS — C799 Secondary malignant neoplasm of unspecified site: Secondary | ICD-10-CM | POA: Diagnosis not present

## 2024-03-16 DEATH — deceased
